# Patient Record
Sex: Female | Born: 1950 | Race: Black or African American | Hispanic: No | Smoking: Current every day smoker
Health system: Southern US, Community
[De-identification: ages and names within clinical notes are randomized; demographics above are authoritative.]

## PROBLEM LIST (undated history)

## (undated) DIAGNOSIS — M1990 Unspecified osteoarthritis, unspecified site: Secondary | ICD-10-CM

## (undated) DIAGNOSIS — H409 Unspecified glaucoma: Secondary | ICD-10-CM

## (undated) DIAGNOSIS — E079 Disorder of thyroid, unspecified: Secondary | ICD-10-CM

## (undated) DIAGNOSIS — F419 Anxiety disorder, unspecified: Secondary | ICD-10-CM

## (undated) DIAGNOSIS — E119 Type 2 diabetes mellitus without complications: Secondary | ICD-10-CM

## (undated) DIAGNOSIS — R413 Other amnesia: Secondary | ICD-10-CM

## (undated) DIAGNOSIS — R011 Cardiac murmur, unspecified: Secondary | ICD-10-CM

## (undated) DIAGNOSIS — E785 Hyperlipidemia, unspecified: Secondary | ICD-10-CM

## (undated) DIAGNOSIS — I10 Essential (primary) hypertension: Secondary | ICD-10-CM

## (undated) DIAGNOSIS — C801 Malignant (primary) neoplasm, unspecified: Secondary | ICD-10-CM

## (undated) HISTORY — DX: Anxiety disorder, unspecified: F41.9

## (undated) HISTORY — DX: Type 2 diabetes mellitus without complications: E11.9

## (undated) HISTORY — DX: Cardiac murmur, unspecified: R01.1

## (undated) HISTORY — DX: Other amnesia: R41.3

## (undated) HISTORY — PX: OTHER SURGICAL HISTORY: SHX169

## (undated) HISTORY — DX: Unspecified glaucoma: H40.9

## (undated) HISTORY — DX: Hyperlipidemia, unspecified: E78.5

## (undated) HISTORY — PX: COLONOSCOPY: SHX174

## (undated) HISTORY — DX: Malignant (primary) neoplasm, unspecified: C80.1

## (undated) HISTORY — DX: Unspecified osteoarthritis, unspecified site: M19.90

## (undated) HISTORY — DX: Disorder of thyroid, unspecified: E07.9

---

## 1998-08-11 ENCOUNTER — Other Ambulatory Visit: Admission: RE | Admit: 1998-08-11 | Discharge: 1998-08-11 | Payer: Self-pay | Admitting: Nurse Practitioner

## 1998-09-27 ENCOUNTER — Encounter: Payer: Self-pay | Admitting: Emergency Medicine

## 1998-09-27 ENCOUNTER — Emergency Department (HOSPITAL_COMMUNITY): Admission: EM | Admit: 1998-09-27 | Discharge: 1998-09-27 | Payer: Self-pay | Admitting: Emergency Medicine

## 1998-09-28 ENCOUNTER — Encounter: Payer: Self-pay | Admitting: Orthopedic Surgery

## 1998-09-29 ENCOUNTER — Inpatient Hospital Stay (HOSPITAL_COMMUNITY): Admission: RE | Admit: 1998-09-29 | Discharge: 1998-09-30 | Payer: Self-pay | Admitting: Orthopedic Surgery

## 1999-05-05 ENCOUNTER — Ambulatory Visit (HOSPITAL_BASED_OUTPATIENT_CLINIC_OR_DEPARTMENT_OTHER): Admission: RE | Admit: 1999-05-05 | Discharge: 1999-05-05 | Payer: Self-pay | Admitting: Orthopedic Surgery

## 1999-08-01 ENCOUNTER — Ambulatory Visit (HOSPITAL_COMMUNITY): Admission: RE | Admit: 1999-08-01 | Discharge: 1999-08-01 | Payer: Self-pay | Admitting: *Deleted

## 2000-06-07 ENCOUNTER — Ambulatory Visit (HOSPITAL_COMMUNITY): Admission: RE | Admit: 2000-06-07 | Discharge: 2000-06-07 | Payer: Self-pay | Admitting: *Deleted

## 2002-02-23 ENCOUNTER — Emergency Department (HOSPITAL_COMMUNITY): Admission: EM | Admit: 2002-02-23 | Discharge: 2002-02-23 | Payer: Self-pay | Admitting: Emergency Medicine

## 2002-03-25 ENCOUNTER — Encounter: Payer: Self-pay | Admitting: Internal Medicine

## 2002-03-25 ENCOUNTER — Ambulatory Visit (HOSPITAL_COMMUNITY): Admission: RE | Admit: 2002-03-25 | Discharge: 2002-03-25 | Payer: Self-pay | Admitting: Internal Medicine

## 2004-02-10 ENCOUNTER — Emergency Department (HOSPITAL_COMMUNITY): Admission: EM | Admit: 2004-02-10 | Discharge: 2004-02-10 | Payer: Self-pay | Admitting: *Deleted

## 2004-03-16 ENCOUNTER — Ambulatory Visit: Payer: Self-pay | Admitting: Family Medicine

## 2004-05-22 ENCOUNTER — Emergency Department (HOSPITAL_COMMUNITY): Admission: EM | Admit: 2004-05-22 | Discharge: 2004-05-22 | Payer: Self-pay | Admitting: Emergency Medicine

## 2005-07-03 ENCOUNTER — Encounter (INDEPENDENT_AMBULATORY_CARE_PROVIDER_SITE_OTHER): Payer: Self-pay | Admitting: *Deleted

## 2005-07-03 LAB — CONVERTED CEMR LAB

## 2005-07-13 ENCOUNTER — Ambulatory Visit: Payer: Self-pay | Admitting: Family Medicine

## 2005-07-21 ENCOUNTER — Ambulatory Visit: Payer: Self-pay | Admitting: Gastroenterology

## 2005-07-27 ENCOUNTER — Ambulatory Visit: Payer: Self-pay | Admitting: Family Medicine

## 2005-07-28 ENCOUNTER — Ambulatory Visit (HOSPITAL_COMMUNITY): Admission: RE | Admit: 2005-07-28 | Discharge: 2005-07-28 | Payer: Self-pay | Admitting: Family Medicine

## 2005-08-04 ENCOUNTER — Ambulatory Visit: Payer: Self-pay | Admitting: Family Medicine

## 2005-08-22 ENCOUNTER — Ambulatory Visit: Payer: Self-pay | Admitting: Gastroenterology

## 2005-09-12 ENCOUNTER — Ambulatory Visit: Payer: Self-pay | Admitting: Family Medicine

## 2005-10-03 ENCOUNTER — Ambulatory Visit: Payer: Self-pay | Admitting: Family Medicine

## 2005-10-13 ENCOUNTER — Ambulatory Visit: Payer: Self-pay | Admitting: Family Medicine

## 2005-12-29 ENCOUNTER — Ambulatory Visit: Payer: Self-pay | Admitting: Sports Medicine

## 2006-01-01 ENCOUNTER — Ambulatory Visit: Payer: Self-pay | Admitting: Sports Medicine

## 2006-01-10 ENCOUNTER — Ambulatory Visit (HOSPITAL_COMMUNITY): Admission: RE | Admit: 2006-01-10 | Discharge: 2006-01-10 | Payer: Self-pay | Admitting: Sports Medicine

## 2006-01-10 ENCOUNTER — Ambulatory Visit: Payer: Self-pay | Admitting: Cardiovascular Disease

## 2006-01-10 ENCOUNTER — Encounter: Payer: Self-pay | Admitting: Cardiovascular Disease

## 2006-01-26 ENCOUNTER — Ambulatory Visit: Payer: Self-pay | Admitting: Family Medicine

## 2006-02-12 ENCOUNTER — Ambulatory Visit: Payer: Self-pay | Admitting: Sports Medicine

## 2006-02-26 ENCOUNTER — Encounter: Admission: RE | Admit: 2006-02-26 | Discharge: 2006-05-08 | Payer: Self-pay | Admitting: Family Medicine

## 2006-04-03 ENCOUNTER — Ambulatory Visit: Payer: Self-pay | Admitting: Family Medicine

## 2006-07-25 ENCOUNTER — Ambulatory Visit: Payer: Self-pay | Admitting: Family Medicine

## 2006-08-16 ENCOUNTER — Ambulatory Visit: Payer: Self-pay | Admitting: Sports Medicine

## 2006-08-30 DIAGNOSIS — I10 Essential (primary) hypertension: Secondary | ICD-10-CM

## 2006-08-30 DIAGNOSIS — M1990 Unspecified osteoarthritis, unspecified site: Secondary | ICD-10-CM

## 2006-08-30 DIAGNOSIS — E0500 Thyrotoxicosis with diffuse goiter without thyrotoxic crisis or storm: Secondary | ICD-10-CM

## 2006-08-30 DIAGNOSIS — K219 Gastro-esophageal reflux disease without esophagitis: Secondary | ICD-10-CM

## 2006-08-31 ENCOUNTER — Encounter (INDEPENDENT_AMBULATORY_CARE_PROVIDER_SITE_OTHER): Payer: Self-pay | Admitting: *Deleted

## 2006-10-30 ENCOUNTER — Telehealth (INDEPENDENT_AMBULATORY_CARE_PROVIDER_SITE_OTHER): Payer: Self-pay | Admitting: *Deleted

## 2006-11-07 ENCOUNTER — Emergency Department (HOSPITAL_COMMUNITY): Admission: EM | Admit: 2006-11-07 | Discharge: 2006-11-07 | Payer: Self-pay | Admitting: Emergency Medicine

## 2006-12-12 ENCOUNTER — Telehealth: Payer: Self-pay | Admitting: *Deleted

## 2006-12-14 ENCOUNTER — Encounter: Payer: Self-pay | Admitting: Family Medicine

## 2006-12-14 ENCOUNTER — Ambulatory Visit: Payer: Self-pay | Admitting: Family Medicine

## 2006-12-14 LAB — CONVERTED CEMR LAB
BUN: 12 mg/dL (ref 6–23)
Calcium: 10.3 mg/dL (ref 8.4–10.5)
Chloride: 103 meq/L (ref 96–112)
Sodium: 140 meq/L (ref 135–145)
TSH: 1.197 microintl units/mL (ref 0.350–5.50)

## 2007-02-05 ENCOUNTER — Ambulatory Visit: Payer: Self-pay | Admitting: Family Medicine

## 2007-02-05 LAB — CONVERTED CEMR LAB: Blood Glucose, Fingerstick: 132

## 2007-02-06 ENCOUNTER — Encounter: Payer: Self-pay | Admitting: Family Medicine

## 2008-04-21 ENCOUNTER — Ambulatory Visit: Payer: Self-pay | Admitting: Family Medicine

## 2008-04-21 ENCOUNTER — Encounter: Payer: Self-pay | Admitting: Family Medicine

## 2008-04-21 ENCOUNTER — Observation Stay (HOSPITAL_COMMUNITY): Admission: EM | Admit: 2008-04-21 | Discharge: 2008-04-23 | Payer: Self-pay | Admitting: Emergency Medicine

## 2008-05-01 ENCOUNTER — Ambulatory Visit: Payer: Self-pay | Admitting: Family Medicine

## 2008-06-17 ENCOUNTER — Encounter: Payer: Self-pay | Admitting: Family Medicine

## 2008-06-18 ENCOUNTER — Encounter: Payer: Self-pay | Admitting: Family Medicine

## 2009-03-18 ENCOUNTER — Telehealth: Payer: Self-pay | Admitting: Family Medicine

## 2009-03-19 ENCOUNTER — Emergency Department (HOSPITAL_COMMUNITY): Admission: EM | Admit: 2009-03-19 | Discharge: 2009-03-19 | Payer: Self-pay | Admitting: Emergency Medicine

## 2009-03-20 ENCOUNTER — Encounter (INDEPENDENT_AMBULATORY_CARE_PROVIDER_SITE_OTHER): Payer: Self-pay | Admitting: *Deleted

## 2009-03-20 DIAGNOSIS — F17200 Nicotine dependence, unspecified, uncomplicated: Secondary | ICD-10-CM

## 2009-03-23 ENCOUNTER — Ambulatory Visit: Payer: Self-pay | Admitting: Family Medicine

## 2009-03-24 LAB — CONVERTED CEMR LAB
AST: 17 units/L (ref 0–37)
Albumin: 5.2 g/dL (ref 3.5–5.2)
BUN: 32 mg/dL — ABNORMAL HIGH (ref 6–23)
CO2: 22 meq/L (ref 19–32)
Cholesterol: 210 mg/dL — ABNORMAL HIGH (ref 0–200)
Glucose, Bld: 93 mg/dL (ref 70–99)
HCT: 40.9 % (ref 36.0–46.0)
LDL Cholesterol: 118 mg/dL — ABNORMAL HIGH (ref 0–99)
MCHC: 33.3 g/dL (ref 30.0–36.0)
Platelets: 277 10*3/uL (ref 150–400)
RBC: 4.31 M/uL (ref 3.87–5.11)
Total CHOL/HDL Ratio: 2.7
Triglycerides: 74 mg/dL (ref <150)
VLDL: 15 mg/dL (ref 0–40)
WBC: 5.4 10*3/uL (ref 4.0–10.5)

## 2009-05-20 ENCOUNTER — Telehealth: Payer: Self-pay | Admitting: Family Medicine

## 2009-05-26 ENCOUNTER — Telehealth: Payer: Self-pay | Admitting: *Deleted

## 2009-06-18 ENCOUNTER — Ambulatory Visit: Payer: Self-pay | Admitting: Family Medicine

## 2009-06-23 ENCOUNTER — Telehealth: Payer: Self-pay | Admitting: Family Medicine

## 2009-06-23 ENCOUNTER — Encounter: Payer: Self-pay | Admitting: Family Medicine

## 2009-08-04 ENCOUNTER — Ambulatory Visit: Payer: Self-pay | Admitting: Family Medicine

## 2009-08-04 ENCOUNTER — Encounter: Payer: Self-pay | Admitting: Family Medicine

## 2009-08-11 ENCOUNTER — Ambulatory Visit (HOSPITAL_COMMUNITY): Admission: RE | Admit: 2009-08-11 | Discharge: 2009-08-11 | Payer: Self-pay | Admitting: Family Medicine

## 2009-08-27 ENCOUNTER — Ambulatory Visit: Payer: Self-pay | Admitting: Family Medicine

## 2009-08-31 ENCOUNTER — Encounter: Payer: Self-pay | Admitting: Family Medicine

## 2009-09-22 ENCOUNTER — Ambulatory Visit: Payer: Self-pay | Admitting: Family Medicine

## 2009-12-13 ENCOUNTER — Encounter: Payer: Self-pay | Admitting: Family Medicine

## 2009-12-13 ENCOUNTER — Ambulatory Visit: Payer: Self-pay | Admitting: Family Medicine

## 2009-12-13 LAB — CONVERTED CEMR LAB
BUN: 23 mg/dL (ref 6–23)
Calcium: 10.9 mg/dL — ABNORMAL HIGH (ref 8.4–10.5)
Glucose, Bld: 82 mg/dL (ref 70–99)
Potassium: 3.6 meq/L (ref 3.5–5.3)

## 2010-02-18 ENCOUNTER — Encounter: Payer: Self-pay | Admitting: Family Medicine

## 2010-02-18 ENCOUNTER — Ambulatory Visit: Payer: Self-pay | Admitting: Family Medicine

## 2010-02-18 LAB — CONVERTED CEMR LAB
BUN: 40 mg/dL — ABNORMAL HIGH (ref 6–23)
Bilirubin Urine: NEGATIVE
Calcium: 9.5 mg/dL (ref 8.4–10.5)
Chloride: 91 meq/L — ABNORMAL LOW (ref 96–112)
Creatinine, Ser: 1.59 mg/dL — ABNORMAL HIGH (ref 0.40–1.20)
Glucose, Urine, Semiquant: NEGATIVE
Hemoglobin: 11.5 g/dL — ABNORMAL LOW (ref 12.0–15.0)
Ketones, urine, test strip: NEGATIVE
MCHC: 34.4 g/dL (ref 30.0–36.0)
Nitrite: NEGATIVE
Potassium: 2.9 meq/L — ABNORMAL LOW (ref 3.5–5.3)
Sodium: 131 meq/L — ABNORMAL LOW (ref 135–145)

## 2010-02-21 ENCOUNTER — Encounter: Payer: Self-pay | Admitting: Family Medicine

## 2010-02-21 ENCOUNTER — Ambulatory Visit: Payer: Self-pay | Admitting: Family Medicine

## 2010-02-21 LAB — CONVERTED CEMR LAB
Glucose, Urine, Semiquant: NEGATIVE
Nitrite: NEGATIVE

## 2010-02-22 ENCOUNTER — Telehealth: Payer: Self-pay | Admitting: Family Medicine

## 2010-02-22 ENCOUNTER — Encounter: Payer: Self-pay | Admitting: Family Medicine

## 2010-02-22 LAB — CONVERTED CEMR LAB
Amylase: 67 units/L (ref 0–105)
Calcium: 9.4 mg/dL (ref 8.4–10.5)
Chloride: 94 meq/L — ABNORMAL LOW (ref 96–112)
Glucose, Bld: 126 mg/dL — ABNORMAL HIGH (ref 70–99)
Potassium: 2.7 meq/L — CL (ref 3.5–5.3)
Sodium: 133 meq/L — ABNORMAL LOW (ref 135–145)
Total Bilirubin: 0.5 mg/dL (ref 0.3–1.2)

## 2010-02-23 ENCOUNTER — Ambulatory Visit: Payer: Self-pay | Admitting: Family Medicine

## 2010-02-23 ENCOUNTER — Encounter: Payer: Self-pay | Admitting: Family Medicine

## 2010-02-24 LAB — CONVERTED CEMR LAB
BUN: 16 mg/dL (ref 6–23)
CO2: 26 meq/L (ref 19–32)
Chloride: 100 meq/L (ref 96–112)
Creatinine, Ser: 0.76 mg/dL (ref 0.40–1.20)
Glucose, Bld: 94 mg/dL (ref 70–99)
Potassium: 3.7 meq/L (ref 3.5–5.3)

## 2010-03-04 ENCOUNTER — Ambulatory Visit: Payer: Self-pay | Admitting: Family Medicine

## 2010-03-04 ENCOUNTER — Encounter: Payer: Self-pay | Admitting: Family Medicine

## 2010-03-04 ENCOUNTER — Ambulatory Visit (HOSPITAL_COMMUNITY): Admission: RE | Admit: 2010-03-04 | Discharge: 2010-03-04 | Payer: Self-pay | Admitting: Family Medicine

## 2010-03-08 ENCOUNTER — Ambulatory Visit: Payer: Self-pay | Admitting: Cardiology

## 2010-03-08 DIAGNOSIS — R011 Cardiac murmur, unspecified: Secondary | ICD-10-CM | POA: Insufficient documentation

## 2010-03-14 ENCOUNTER — Ambulatory Visit (HOSPITAL_COMMUNITY): Admission: RE | Admit: 2010-03-14 | Discharge: 2010-03-14 | Payer: Self-pay | Admitting: Cardiology

## 2010-03-14 ENCOUNTER — Ambulatory Visit: Payer: Self-pay

## 2010-03-14 ENCOUNTER — Ambulatory Visit: Payer: Self-pay | Admitting: Cardiovascular Disease

## 2010-03-14 ENCOUNTER — Ambulatory Visit: Payer: Self-pay | Admitting: Family Medicine

## 2010-03-14 ENCOUNTER — Encounter: Payer: Self-pay | Admitting: Cardiology

## 2010-04-13 ENCOUNTER — Ambulatory Visit: Payer: Self-pay | Admitting: Family Medicine

## 2010-04-13 ENCOUNTER — Telehealth: Payer: Self-pay | Admitting: *Deleted

## 2010-04-13 DIAGNOSIS — R319 Hematuria, unspecified: Secondary | ICD-10-CM | POA: Insufficient documentation

## 2010-04-13 LAB — CONVERTED CEMR LAB
Glucose, Urine, Semiquant: NEGATIVE
Specific Gravity, Urine: 1.015
Urobilinogen, UA: 0.2
pH: 7

## 2010-04-15 ENCOUNTER — Ambulatory Visit (HOSPITAL_COMMUNITY): Admission: RE | Admit: 2010-04-15 | Discharge: 2010-04-15 | Payer: Self-pay | Admitting: Family Medicine

## 2010-04-20 ENCOUNTER — Telehealth: Payer: Self-pay | Admitting: Family Medicine

## 2010-04-26 ENCOUNTER — Ambulatory Visit: Payer: Self-pay | Admitting: Family Medicine

## 2010-04-27 ENCOUNTER — Encounter: Payer: Self-pay | Admitting: Family Medicine

## 2010-05-02 LAB — CONVERTED CEMR LAB

## 2010-05-11 ENCOUNTER — Encounter: Payer: Self-pay | Admitting: Family Medicine

## 2010-06-03 ENCOUNTER — Encounter: Payer: Self-pay | Admitting: Family Medicine

## 2010-06-03 ENCOUNTER — Ambulatory Visit: Payer: Self-pay | Admitting: Family Medicine

## 2010-06-06 LAB — CONVERTED CEMR LAB
BUN: 30 mg/dL — ABNORMAL HIGH (ref 6–23)
Chloride: 100 meq/L (ref 96–112)
Ferritin: 218 ng/mL (ref 10–291)
Glucose, Bld: 141 mg/dL — ABNORMAL HIGH (ref 70–99)
HCT: 38.3 % (ref 36.0–46.0)
Iron: 79 ug/dL (ref 42–145)
MCHC: 33.9 g/dL (ref 30.0–36.0)
TIBC: 401 ug/dL (ref 250–470)

## 2010-06-08 ENCOUNTER — Telehealth: Payer: Self-pay | Admitting: Family Medicine

## 2010-06-10 ENCOUNTER — Ambulatory Visit: Payer: Self-pay | Admitting: Family Medicine

## 2010-06-10 ENCOUNTER — Encounter: Payer: Self-pay | Admitting: Family Medicine

## 2010-06-14 LAB — CONVERTED CEMR LAB
Calcium, Total (PTH): 10.7 mg/dL — ABNORMAL HIGH (ref 8.4–10.5)
Chloride: 103 meq/L (ref 96–112)
Glucose, Bld: 118 mg/dL — ABNORMAL HIGH (ref 70–99)
PTH: 24.1 pg/mL (ref 14.0–72.0)
Potassium: 3.7 meq/L (ref 3.5–5.3)
Sodium: 139 meq/L (ref 135–145)
Vit D, 25-Hydroxy: 42 ng/mL (ref 30–89)

## 2010-06-23 ENCOUNTER — Encounter: Payer: Self-pay | Admitting: Family Medicine

## 2010-06-23 ENCOUNTER — Ambulatory Visit: Payer: Self-pay | Admitting: Family Medicine

## 2010-06-23 DIAGNOSIS — N179 Acute kidney failure, unspecified: Secondary | ICD-10-CM

## 2010-06-23 LAB — CONVERTED CEMR LAB
BUN: 13 mg/dL (ref 6–23)
CO2: 26 meq/L (ref 19–32)
Chloride: 102 meq/L (ref 96–112)
Glucose, Bld: 98 mg/dL (ref 70–99)
Sodium: 139 meq/L (ref 135–145)

## 2010-07-07 ENCOUNTER — Ambulatory Visit: Admit: 2010-07-07 | Payer: Self-pay

## 2010-07-12 ENCOUNTER — Ambulatory Visit: Admission: RE | Admit: 2010-07-12 | Discharge: 2010-07-12 | Payer: Self-pay | Source: Home / Self Care

## 2010-07-12 ENCOUNTER — Encounter: Payer: Self-pay | Admitting: Family Medicine

## 2010-07-12 LAB — CONVERTED CEMR LAB
BUN: 22 mg/dL (ref 6–23)
Hemoglobin: 12.9 g/dL (ref 12.0–15.0)
LDL Cholesterol: 89 mg/dL (ref 0–99)
MCHC: 33.6 g/dL (ref 30.0–36.0)
MCV: 90.8 fL (ref 78.0–100.0)
Platelets: 301 10*3/uL (ref 150–400)
RBC: 4.23 M/uL (ref 3.87–5.11)
Sodium: 139 meq/L (ref 135–145)
Triglycerides: 55 mg/dL (ref <150)
WBC: 5.5 10*3/uL (ref 4.0–10.5)

## 2010-07-18 ENCOUNTER — Telehealth: Payer: Self-pay | Admitting: Family Medicine

## 2010-07-31 LAB — CONVERTED CEMR LAB
BUN: 10 mg/dL (ref 6–23)
Chloride: 103 meq/L (ref 96–112)
Cortisol, Plasma: 8 ug/dL
Glucose, Bld: 85 mg/dL (ref 70–99)
Glucose, Urine, Semiquant: NEGATIVE
HCT: 37 % (ref 36.0–46.0)
Hemoglobin: 11.9 g/dL — ABNORMAL LOW (ref 12.0–15.0)
Ketones, urine, test strip: NEGATIVE
RBC: 3.93 M/uL (ref 3.87–5.11)
Urobilinogen, UA: 0.2

## 2010-08-02 NOTE — Assessment & Plan Note (Signed)
Summary: f/up,tcb   Vital Signs:  Patient profile:   60 year old female Height:      62 inches Weight:      115 pounds BMI:     21.11 Temp:     98.3 degrees F oral Pulse rate:   60 / minute BP sitting:   138 / 52  (right arm) Cuff size:   regular  Vitals Entered By: Tessie Fass CMA (September 22, 2009 4:36 PM)  Serial Vital Signs/Assessments:  Time      Position  BP       Pulse  Resp  Temp     By                     135/54                         Laneya Gasaway MD  CC: f/u bp Is Patient Diabetic? No Pain Assessment Patient in pain? no        Primary Care Provider:  Elisa Kutner MD  CC:  f/u bp.  History of Present Illness: 60 y/o F here for BP f/u  HYPERTENSION Disease Monitoring Blood pressure range: 140s-150s Medications: hctz 25, lisinopril 40, norvasc 10 Compliance: yes  Lightheadedness:no  Edema: no  Chest pain: no  Dyspnea: no Prevention Exercise: dancing, walking, stationary bike  Salt restriction:still using a LITTLE  Tobacco abuse:  Smoking 4 cig per day. Husband smokes.  still trying to quit  Habits & Providers  Alcohol-Tobacco-Diet     Tobacco Status: current     Cigarette Packs/Day: 0.5  Exercise-Depression-Behavior     Does Patient Exercise: yes     Exercise Counseling: not indicated; exercise is adequate     Type of exercise: walking, stationary bike, dancing     Exercise (avg: min/session): <30     Times/week: 5  Current Medications (verified): 1)  Lisinopril-Hydrochlorothiazide 20-12.5 Mg Tabs (Lisinopril-Hydrochlorothiazide) .... Take 2 Tablet By Mouth Once A Day 2)  Norvasc 10 Mg Tabs (Amlodipine Besylate) .Marland Kitchen.. 1 Tab By Mouth Daily For Blood Pressure 3)  Simvastatin 40 Mg Tabs (Simvastatin) .Marland Kitchen.. 1 Tab By Mouth At Bedtime 4)  Aspirin 81 Mg Tabs (Aspirin) .... By Mouth Daily 5)  Permethrin 5 % Crea (Permethrin) .... Apply To Entire Body and Leave On For 12 Hours Then Rinse. Disp 2 Applications. 6)  Hydroxyzine Hcl 50 Mg Tabs (Hydroxyzine Hcl) .... 1/2  To 56full Tablet Every 12 Hours As Needed For Itching.  Allergies (verified): 1)  ! 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 Abnormal Stress EKG 12/2005 HTN Graves dz s/p ablation Eczema GERD   Past Surgical History: Last updated: 07/04/09 -cyst behind left ear removed by general surgery 1992 -Left rotator cuff repair 2003 -Left foot surgery after falling 2000  Family History: Last updated: 07/04/09 father - deceased at 2 from CVA mother - deceased (scleroderma, DM, RA, HTN) sister -deceased w/ DM, polysubstance abuse No Breast Cancer No GYN cancer No colon cancer  Social History: Last updated: 07-04-09 [employer]; Smokes 3-4 cigs/day (67yrs), occ EtOH, lives with husband Has 3 children, 17 grand children  Code Status:  Full Code  Risk Factors: Alcohol Use: <1 (07/04/09) Exercise: yes (09/22/2009)  Risk Factors: Smoking Status: current (09/22/2009) Packs/Day: 0.5 (09/22/2009)  Review of Systems       per hpi  Physical Exam  General:  Well-developed,well-nourished,in no acute distress; alert,appropriate and cooperative throughout examination. vitals reviewed Neck:  supple, full ROM, and no masses.   Lungs:  Normal respiratory effort, chest expands symmetrically. Lungs are clear to auscultation, no crackles or wheezes. Heart:  normal rate and regular rhythm.  normal S1, S2 Abdomen:  Bowel sounds positive,abdomen soft and non-tender without masses, organomegaly or hernias noted. Pulses:  R and L carotid,radial,dorsalis pedis  are full and equal bilaterally Extremities:  No clubbing, cyanosis, edema, or deformity noted with normal full range of motion of all joints.   Neurologic:  No cranial nerve deficits noted. Station and gait are normal. Plantar reflexes are down-going bilaterally. DTRs are symmetrical throughout.  Sensory, motor and coordinative functions appear intact. Psych:  Oriented X3.     Impression & Recommendations:  Problem # 1:  HYPERTENSION, BENIGN SYSTEMIC (ICD-401.1) Assessment Improved  Will continue current meds for now.  Diastolic BP mildly low in the 96E.  Will monitor.  Pt to rtc in 2 months.  Her updated medication list for this problem includes:    Lisinopril-hydrochlorothiazide 20-12.5 Mg Tabs (Lisinopril-hydrochlorothiazide) .Marland Kitchen... Take 2 tablet by mouth once a day    Norvasc 10 Mg Tabs (Amlodipine besylate) .Marland Kitchen... 1 tab by mouth daily for blood pressure  Orders: [facility]- Est Level  3 (95284)  Complete Medication List: 1)  Lisinopril-hydrochlorothiazide 20-12.5 Mg Tabs (Lisinopril-hydrochlorothiazide) .... Take 2 tablet by mouth once a day 2)  Norvasc 10 Mg Tabs (Amlodipine besylate) .Marland Kitchen.. 1 tab by mouth daily for blood pressure 3)  Simvastatin 40 Mg Tabs (Simvastatin) .Marland Kitchen.. 1 tab by mouth at bedtime 4)  Aspirin 81 Mg Tabs (Aspirin) .... By mouth daily 5)  Permethrin 5 % Crea (Permethrin) .... Apply to entire body and leave on for 12 hours then rinse. disp 2 applications. 6)  Hydroxyzine Hcl 50 Mg Tabs (Hydroxyzine hcl) .... 1/2 to 58full tablet every 12 hours as needed for itching.  Patient Instructions: 1)  Please schedule a follow-up appointment in 2 months for blood pressure.  2)  Tobacco is very bad for your health and your loved ones ! You should stop smoking !  3)  Stop smoking tips: Choose a quit date. Cut down before the quit date. Decide what you will do as a substitute when you feel the urge to smoke(gum, toothpick, exercise).  4)  It is important that you exercise reguarly at least 30 minutes 5 times a week. If you develop chest pain, have severe difficulty breathing, or feel very tired, stop exercising immediately and seek medical attention.  Prescriptions: NORVASC 10 MG TABS (AMLODIPINE BESYLATE) 1 tab by mouth daily for blood pressure  #34 x 6   Entered and  Authorized by:   Angeline Slim MD   Signed by:   Angeline Slim MD on 09/22/2009   Method used:   Electronically to        [facility] [phone]* (retail)       [address]       [place], [place]  [address]       Ph: [phone]       Fax: [phone]   RxID:   [id number] LISINOPRIL-HYDROCHLOROTHIAZIDE 20-12.5 MG TABS (LISINOPRIL-HYDROCHLOROTHIAZIDE) Take 2 tablet by mouth once a day  #66 x 6   Entered and Authorized by:   Angeline Slim MD   Signed by:   Angeline Slim MD on 09/22/2009   Method used:   Electronically to  [facility] [address] [phone]* (retail)       [address]       [place], [place]  [address]       Ph: [phone]       Fax: [phone]   RxID:   [id number]     Prevention & Chronic Care Immunizations   Influenza vaccine: Not documented   Influenza vaccine deferral: Refused  (06/18/2009)    Tetanus booster: 07/03/2005: Done.    Pneumococcal vaccine: Not documented  Colorectal Screening   Hemoccult: Not documented   Hemoccult action/deferral: Ordered  (06/18/2009)    Colonoscopy: Not documented   Colonoscopy action/deferral: GI Referral  (08/04/2009)  Other Screening   Pap smear: NEGATIVE FOR INTRAEPITHELIAL LESIONS OR MALIGNANCY.  (06/18/2009)   Pap smear action/deferral: Deferred-3 yr interval  (06/23/2009)   Pap smear due: 06/23/2012    Mammogram: ASSESSMENT: Negative - BI-RADS 1^MM DIGITAL SCREENING  (08/11/2009)   Mammogram action/deferral: Ordered  (08/04/2009)   Mammogram due: 08/11/2010   Smoking status: current  (09/22/2009)   Smoking cessation counseling: YES  (09/22/2009)  Lipids   Total Cholesterol: 210  (03/23/2009)   LDL: 118  (03/23/2009)   LDL Direct: Not documented   HDL: 77  (03/23/2009)   Triglycerides: 74  (03/23/2009)  Hypertension   Last Blood Pressure: 138 / 52  (09/22/2009)   Serum creatinine: 1.31  (03/23/2009)   BMP action: Ordered   Serum potassium 4.5  (03/23/2009)    Hypertension flowsheet reviewed?: Yes    Progress toward BP goal: At goal  Self-Management Support :   Personal Goals (by the next clinic visit) :      Personal blood pressure goal: 140/90  (08/04/2009)   Hypertension self-management support: Written self-care plan  (09/22/2009)   Hypertension self-care plan printed.

## 2010-08-02 NOTE — Assessment & Plan Note (Signed)
Summary: F/U PER DR. TA/[facility]   Vital Signs:  Patient profile:   60 year old female Height:      62 inches Weight:      112 pounds BMI:     20.56 Temp:     98.1 degrees F oral Pulse rate:   57 / minute BP sitting:   114 / 56  (right arm) Cuff size:   regular  Vitals Entered By: Tessie Fass CMA (February 21, 2010 1:39 PM) CC: F/U dizziness Is Patient Diabetic? No Pain Assessment Patient in pain? yes     Location: neck and shoulders Intensity: 9   Primary Care Provider:  Cat Ta MD  CC:  F/U dizziness.  History of Present Illness: Three day follow up for vomiting, low BP, dizziness....labs reveled acute bump in serum creatitine, hypokalemia.  Urinalysis with RBC, protein, few WBCs.    She is here with significant other, denied excessive alcohol intake.  When out of the room her female friend asked if it could have been caused by salt, alcohol and smoking.  She denies binge drinking, friend reported a glass of liquor before she got sick.  She has not vomited for several days, is drinking fluids, eating fruit.  Had an egg and baked potato for breakfast and held it down.  Denies indigestion, fever, dysuria.  Allergies: 1)  ! Codeine  Review of Systems       The patient complains of anorexia, prolonged cough, and abdominal pain.  The patient denies fever, chest pain, syncope, hemoptysis, and severe indigestion/heartburn.    Physical Exam  General:  Thin, in no acute distress Mouth:  moist membranes Lungs:  normal respiratory effort and normal breath sounds.   Heart:  normal rate, regular rhythm, and no murmur.   Abdomen:  RUQ tenderness, + Murphys, otherwise soft, and hyperactive bowel sounds   Impression & Recommendations:  Problem # 1:  VOMITING (ICD-787.03) Resolved, labs indicating loss of electrolytes and acute dehydration.  She is to hold her by mouth meds.  Consider acute pancreatitis as there seems to have been an alcohol binge that may have preceeded her illness  per her female friend.  She was guarded when asked these questions. Orders: Amylase-[facility] ([id number]) Lipase-[facility] ([id number]) Urinalysis-[facility] (00000) [facility]- Est Level  3 (29562)  Problem # 2:  HEMATURIA UNSPECIFIED (ICD-599.70) recheck urine, did not culture as contaminated sample and patient left Orders: Urinalysis-[facility] (00000) [facility]- Est Level  3 (13086)  Problem # 3:  HYPOKALEMIA (ICD-276.8) repeat labs, to drink fruit juice and eat fruit Orders: Comp Met-[facility] ([id number]) [facility]- Est Level  3 (28413)  Problem # 4:  HYPERTENSION, BENIGN SYSTEMIC (ICD-401.1) HOLD meds, BP 114/56 and creatitine 1.51 Her updated medication list for this problem includes:    Lisinopril-hydrochlorothiazide 20-12.5 Mg Tabs (Lisinopril-hydrochlorothiazide) .Marland Kitchen... Take 2 tablet by mouth once a day hold    Norvasc 10 Mg Tabs (Amlodipine besylate) .Marland Kitchen... 1 tab by mouth daily for blood pressure hold  Complete Medication List: 1)  Lisinopril-hydrochlorothiazide 20-12.5 Mg Tabs (Lisinopril-hydrochlorothiazide) .... Take 2 tablet by mouth once a day hold 2)  Norvasc 10 Mg Tabs (Amlodipine besylate) .Marland Kitchen.. 1 tab by mouth daily for blood pressure hold 3)  Simvastatin 40 Mg Tabs (Simvastatin) .Marland Kitchen.. 1 tab by mouth at bedtime hold 4)  Aspirin 81 Mg Tabs (Aspirin) .... By mouth daily hold 5)  Hydrocortisone 2.5 % Crea (Hydrocortisone) .... Apply to legs two times a day.  dispense 30 gram.  Patient Instructions: 1)  No alcohol at  all 2)  Drink a lot of water, eat lightly and healthy 3)  Do not take any of your meds until Dr. Janalyn Harder sees you this week. 4)  Follow up visit with Dr. Janalyn Harder this week   Laboratory Results   Urine Tests  Date/Time Received: February 21, 2010 2:16 PM  Date/Time Reported: February 21, 2010 2:39 PM   Routine Urinalysis   Color: yellow Appearance: Clear Glucose: negative   (Normal Range: Negative) Bilirubin: negative   (Normal Range: Negative) Ketone: negative   (Normal Range: Negative) Spec. Gravity:  1.010   (Normal Range: 1.003-1.035) Blood: moderate   (Normal Range: Negative) pH: 5.5   (Normal Range: 5.0-8.0) Protein: negative   (Normal Range: Negative) Urobilinogen: 0.2   (Normal Range: 0-1) Nitrite: negative   (Normal Range: Negative) Leukocyte Esterace: negative   (Normal Range: Negative)  Urine Microscopic WBC/HPF: 5-10 RBC/HPF: 1-5 Bacteria/HPF: 2+ Epithelial/HPF: 10- 20 Casts/LPF: 1-5 RTE cell casts Other: several RTE cells    Comments: ...............test performed by......Marland KitchenBonnie A. [place]az[place], MLS (ASCP)cm

## 2010-08-02 NOTE — Letter (Signed)
Summary: *Referral Letter for colonoscopy  [facility]  [address]   [place], [place] [address]   Phone: [phone]  Fax: [phone]    08/04/2009  Thank you in advance for agreeing to see my patient:  Jonae Renshaw [address] [place], [place]  [address]  Phone: [phone]  Reason for Referral:  Ms. Tasia Catchings is a 60 y/o Female with Pmhx of HTN and HLD.  She does not have a family history of colon cancer.  She does not endorse constipation and a recent set of stool cards that were negative for blood.    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  Sincerely,  Katlynne Mckercher MD

## 2010-08-02 NOTE — Progress Notes (Signed)
  Phone Note Outgoing Call   Call placed by: Angeline Slim MD,  June 08, 2010 8:40 AM Call placed to: Patient Summary of Call: Called to check on pt.  She got message I left re worsening renal function.  She changed her HCTZ-Lisinopril as directed.  Will come in next week for labs.   Initial call taken by: Cat Ta MD,  June 08, 2010 8:40 AM

## 2010-08-02 NOTE — Assessment & Plan Note (Signed)
Summary: f/u eo   Vital Signs:  Patient profile:   60 year old female Height:      62 inches Weight:      111 pounds BMI:     20.38 BSA:     1.49 Temp:     98.8 degrees F oral Pulse rate:   56 / minute BP sitting:   127 / 52  (right arm) Cuff size:   regular  Vitals Entered By: Tessie Fass CMA (February 23, 2010 2:00 PM) CC: F/U Is Patient Diabetic? No   Primary Care Zachrey Deutscher:  Cat Ta MD  CC:  F/U.  History of Present Illness: 60 y/o F here for f/u of myalgias, hypotension.  Was seen two days ago by Darryll Capers.  BP meds have been held since Fri 8/19.    Feeling much better compared to last week.  But still feeling a little tired, wants to sleep a lot.  Menopause 37 yrs.  Still a little lightheaded when she stands up.  Drinking water liberally. Eating increased, but not as much as normally.  Weight loss of 1 lb since 2 days ago.  No fever, chills, vomiting, chest pain.  +lighthededness.    Etoh: Usually goes out on Sunday, drinks gin.  Usually drinks on Sun when they go to the club, 1-2 drinks (8 oz each).  then would drink left over on Mon and Tues.    Current Medications (verified): 1)  Lisinopril-Hydrochlorothiazide 20-12.5 Mg Tabs (Lisinopril-Hydrochlorothiazide) .... Take 2 Tablet By Mouth Once A Day Hold 2)  Norvasc 10 Mg Tabs (Amlodipine Besylate) .Marland Kitchen.. 1 Tab By Mouth Daily For Blood Pressure Hold 3)  Simvastatin 40 Mg Tabs (Simvastatin) .Marland Kitchen.. 1 Tab By Mouth At Bedtime Hold 4)  Aspirin 81 Mg Tabs (Aspirin) .... By Mouth Daily Hold 5)  Hydrocortisone 2.5 % Crea (Hydrocortisone) .... Apply To Legs Two Times A Day.  Dispense 30 Gram. 6)  Potassium Chloride Crys Cr 20 Meq Cr-Tabs (Potassium Chloride Crys Cr) .... One Tab Three Times A Day For 2 Days Then One Daily 7)  Famotidine 40 Mg Tabs (Famotidine) .... One Daily  Allergies (verified): 1)  ! Codeine  Review of Systems       per hpi   Physical Exam  General:  Well-developed,well-nourished,in no acute distress;  alert,appropriate and cooperative throughout examination. vitals reviewed.  Neurologic:  alert & oriented X3.     Impression & Recommendations:  Problem # 1:  HYPERTENSION, BENIGN SYSTEMIC (ICD-401.1) Assessment Improved  BP improved from 103-->110s-->120s.  Still holding all BP meds.  Pt drinking water liberally.  Appetite still has not resumed.  Luretha Murphy was concerned about alcohol intake.  Will continue to monitor.    Her updated medication list for this problem includes:    Lisinopril-hydrochlorothiazide 20-12.5 Mg Tabs (Lisinopril-hydrochlorothiazide) .Marland Kitchen... Take 2 tablet by mouth once a day hold    Norvasc 10 Mg Tabs (Amlodipine besylate) .Marland Kitchen... 1 tab by mouth daily for blood pressure hold  Orders: [facility]- Est Level  3 (09811)  Problem # 2:  HYPOKALEMIA (ICD-276.8) Will check B[facility] today.  Pt cont to take KCl daily.  Orders: Basic Met-[facility] ([id number]) [facility]- Est Level  3 (13086)  Problem # 3:  HEMATURIA UNSPECIFIED (ICD-599.70) Assessment: Comment Only Will need further work up when acute illness over.    Complete Medication List: 1)  Lisinopril-hydrochlorothiazide 20-12.5 Mg Tabs (Lisinopril-hydrochlorothiazide) .... Take 2 tablet by mouth once a day hold 2)  Norvasc 10 Mg Tabs (Amlodipine besylate) .Marland KitchenMarland KitchenMarland Kitchen 1  tab by mouth daily for blood pressure hold 3)  Simvastatin 40 Mg Tabs (Simvastatin) .Marland Kitchen.. 1 tab by mouth at bedtime hold 4)  Aspirin 81 Mg Tabs (Aspirin) .... By mouth daily hold 5)  Hydrocortisone 2.5 % Crea (Hydrocortisone) .... Apply to legs two times a day.  dispense 30 gram. 6)  Potassium Chloride Crys Cr 20 Meq Cr-tabs (Potassium chloride crys cr) .... One tab three times a day for 2 days then one daily 7)  Famotidine 40 Mg Tabs (Famotidine) .... One daily  Other Orders: TSH-[facility] ([id number])  Patient Instructions: 1)  Please schedule a follow-up appointment with Dr Janalyn Harder next Friday. 2)  No blood pressure medicine until I see you again next week. 3)  Continue your  potassium. 4)  Continue your stomach medicine (Famotidine). 5)  Eat what you can.  6)  I'll call you tomorrow with lab results.     Prevention & Chronic Care Immunizations   Influenza vaccine: Not documented   Influenza vaccine deferral: Refused  (06/18/2009)    Tetanus booster: 07/03/2005: Done.    Pneumococcal vaccine: Not documented  Colorectal Screening   Hemoccult: Not documented   Hemoccult action/deferral: Ordered  (06/18/2009)    Colonoscopy: Not documented   Colonoscopy action/deferral: GI Referral  (08/04/2009)  Other Screening   Pap smear: NEGATIVE FOR INTRAEPITHELIAL LESIONS OR MALIGNANCY.  (06/18/2009)   Pap smear action/deferral: Deferred-3 yr interval  (06/23/2009)   Pap smear due: 06/23/2012    Mammogram: ASSESSMENT: Negative - BI-RADS 1^MM DIGITAL SCREENING  (08/11/2009)   Mammogram action/deferral: Ordered  (08/04/2009)   Mammogram due: 08/11/2010   Smoking status: current  (02/18/2010)   Smoking cessation counseling: YES  (12/13/2009)  Lipids   Total Cholesterol: 210  (03/23/2009)   LDL: 118  (03/23/2009)   LDL Direct: Not documented   HDL: 77  (03/23/2009)   Triglycerides: 74  (03/23/2009)  Hypertension   Last Blood Pressure: 127 / 52  (02/23/2010)   Serum creatinine: 1.15  (02/21/2010)   BMP action: Ordered   Serum potassium 2.7  (02/21/2010)    Hypertension flowsheet reviewed?: Yes   Progress toward BP goal: Unchanged  Self-Management Support :   Personal Goals (by the next clinic visit) :      Personal blood pressure goal: 140/90  (08/04/2009)   Hypertension self-management support: Written self-care plan  (12/13/2009)

## 2010-08-02 NOTE — Assessment & Plan Note (Signed)
Summary: rash,tcb   Vital Signs:  Patient profile:   60 year old female Weight:      166.2 pounds BMI:     30.51 Temp:     98.3 degrees F BP sitting:   144 / 72  (left arm)  Vitals Entered By: Starleen Blue RN (August 27, 2009 9:27 AM) CC: rash Is Patient Diabetic? No Pain Assessment Patient in pain? no        Primary Care Provider:  Cat Ta MD  CC:  rash.  History of Present Illness: 60 y/o female c/o 6-8 weeks of pruritic rash. husband has also. no new detergents, soaps, lotions. recently purchased new mattress. rash started on legs, also in interior thigh, underneath breasts. tried oral and topical benadryl without relief. no fevers, chills or other systemic symptoms.   Habits & Providers  Alcohol-Tobacco-Diet     Tobacco Status: current     Tobacco Counseling: to quit use of tobacco products     Cigarette Packs/Day: 0.5  Current Medications (verified): 1)  Lisinopril-Hydrochlorothiazide 20-12.5 Mg Tabs (Lisinopril-Hydrochlorothiazide) .... Take 2 Tablet By Mouth Once A Day 2)  Norvasc 10 Mg Tabs (Amlodipine Besylate) .Marland Kitchen.. 1 Tab By Mouth Daily For Blood Pressure 3)  Simvastatin 40 Mg Tabs (Simvastatin) .Marland Kitchen.. 1 Tab By Mouth At Bedtime 4)  Aspirin 81 Mg Tabs (Aspirin) .... By Mouth Daily  Allergies (verified): 1)  ! Codeine  Social History: Packs/Day:  0.5  Physical Exam  General:  Well-developed,well-nourished,in no acute distress; alert,appropriate and cooperative throughout examination. vitals reviewed.   Skin:  papular rash on bilateral legs, upper thighs, underneath breasts. no lesions on hands or feet or wrists.    Impression & Recommendations:  Problem # 1:  SKIN RASH (ICD-782.1) Assessment New  history concerning for scabies. will treat with permethrin. two applications given so that husband may use as well. hydroxyzine for pruritus.   Orders: [facility]- Est Level  3 (16109)  Complete Medication List: 1)  Lisinopril-hydrochlorothiazide 20-12.5 Mg  Tabs (Lisinopril-hydrochlorothiazide) .... Take 2 tablet by mouth once a day 2)  Norvasc 10 Mg Tabs (Amlodipine besylate) .Marland Kitchen.. 1 tab by mouth daily for blood pressure 3)  Simvastatin 40 Mg Tabs (Simvastatin) .Marland Kitchen.. 1 tab by mouth at bedtime 4)  Aspirin 81 Mg Tabs (Aspirin) .... By mouth daily 5)  Permethrin 5 % Crea (Permethrin) .... Apply to entire body and leave on for 12 hours then rinse. disp 2 applications. 6)  Hydroxyzine Hcl 50 Mg Tabs (Hydroxyzine hcl) .... 1/2 to 8full tablet every 12 hours as needed for itching. Prescriptions: HYDROXYZINE HCL 50 MG TABS (HYDROXYZINE HCL) 1/2 to 72full tablet every 12 hours as needed for itching.  #45 x 0   Entered and Authorized by:   Lequita Asal  MD   Signed by:   Lequita Asal  MD on 08/27/2009   Method used:   Electronically to        [facility] [phone]* (retail)       [address]       [place], [place]  [address]       Ph: [phone]       Fax: [phone]   RxID:   [id number] PERMETHRIN 5 % CREA (PERMETHRIN) apply to entire body and leave on for 12 hours then rinse. disp 2 applications.  #2 x 0   Entered and Authorized by:   Lequita Asal  MD   Signed by:   Lequita Asal  MD on 08/27/2009   Method used:  Electronically to        [facility] [phone]* (retail)       [address]       [place], [place]  [address]       Ph: [phone]       Fax: [phone]   RxID:   [id number]

## 2010-08-02 NOTE — Progress Notes (Signed)
Summary: 24-hr urine collection   Phone Note Outgoing Call   Call placed by: Cat Ta MD,  April 20, 2010 1:58 PM Call placed to: Patient Summary of Call: Left message for pt about CT abd and Renal U/S: wnl Now needs 24hr urine collection. Pt to call [facility] and schedule lab appt for this.   Initial call taken by: Cat Ta MD,  April 20, 2010 1:59 PM

## 2010-08-02 NOTE — Assessment & Plan Note (Signed)
Summary: f/u   Vital Signs:  Patient profile:   60 year old female Height:      62 inches Weight:      114 pounds BMI:     20.93 Temp:     98.8 degrees F oral Pulse rate:   69 / minute Pulse (ortho):   53 / minute BP sitting:   164 / 65  (left arm) BP standing:   182 / 75 Cuff size:   regular  Vitals Entered By: Jimmy Footman, CMA (March 04, 2010 2:01 PM)  Serial Vital Signs/Assessments:  Time      Position  BP       Pulse  Resp  Temp     By 1445      Lying RA  165/74   58                    Dennison Nancy RN 1445      Sitting   167/69   52                    Dennison Nancy RN 1445      Standing  182/75   53                    Dennison Nancy RN  CC: f/u labs and BP Is Patient Diabetic? No Pain Assessment Patient in pain? no        Primary Care Provider:  Amauri Medellin MD  CC:  f/u labs and BP.  History of Present Illness: Mrs. Lisa Taylor is a very pleasant 60 y/o with HTN, HLD, GERD who presents for f/u of hypotension and hypokalemia.  About 3 wks ago pt developed what seems like a viral illness, with fever, vomiting and myalgias.  Prior to this she has been dealing with a cough lasting almost 1 month.  On exam she was hypotensive to 100s/40s where baseline BPs are 140s/70-80s, was in acute renal failure (cr 1.59), and hypokalemic (K 2.9), hypochloremic.  BP meds have been held, pt was encouraged to take liberal PO intake, pt was repleted with KCl.  Since then pt's renal function has returned to baseline (Cr 0.76), K 3.7.  Patient states that she feels better, but still having lightheaded whenever she stands up to quickly from sitting position.  She states that she she is currently lightheaded from walking into the room from the waiting room.  She denies chest pain/pressure, some dyspnea with exertion, no swelling, no palpitations, no syncope/presyncope.          Habits & Providers  Alcohol-Tobacco-Diet     Tobacco Status: current     Tobacco Counseling: to quit use of tobacco  products  Current Medications (verified): 1)  Lisinopril-Hydrochlorothiazide 20-12.5 Mg Tabs (Lisinopril-Hydrochlorothiazide) .... Take 2 Tablet By Mouth Once A Day Hold 2)  Norvasc 10 Mg Tabs (Amlodipine Besylate) .Marland Kitchen.. 1 Tab By Mouth Daily For Blood Pressure Hold 3)  Simvastatin 40 Mg Tabs (Simvastatin) .Marland Kitchen.. 1 Tab By Mouth At Bedtime Hold 4)  Aspirin 81 Mg Tabs (Aspirin) .... By Mouth Daily Hold 5)  Hydrocortisone 2.5 % Crea (Hydrocortisone) .... Apply To Legs Two Times A Day.  Dispense 30 Gram. 6)  Potassium Chloride Crys Cr 20 Meq Cr-Tabs (Potassium Chloride Crys Cr) .... One Tab Three Times A Day For 2 Days Then One Daily 7)  Famotidine 40 Mg Tabs (Famotidine) .... One Daily  Allergies (verified): 1)  ! 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 Abnormal Stress EKG 12/2005 HTN Graves dz s/p ablation Eczema GERD   Past Surgical History: Last updated: 06/19/2009 -cyst behind left ear removed by general surgery 1992 -Left rotator cuff repair 2003 -Left foot surgery after falling 2000  Family History: Last updated: 2009/06/19 father - deceased at 69 from CVA mother - deceased (scleroderma, DM, RA, HTN) sister -deceased w/ DM, polysubstance abuse No Breast Cancer No GYN cancer No colon cancer  Social History: Last updated: 03/04/2010 [employer]; Smokes 3-4 cigs/day (10yrs), occ EtOH (drinks on weekend), lives with husband Has 3 children, 17 grand children  Code Status:  Full Code  Risk Factors: Alcohol Use: <1 (2009-06-19) Exercise: yes (09/22/2009)  Risk Factors: Smoking Status: current (03/04/2010) Packs/Day: 0.5 (12/13/2009)  Social History: [employer]; Smokes 3-4 cigs/day (60yrs), occ EtOH (drinks on weekend), lives with husband Has 3 children, 17 grand children  Code Status:  Full Code  Review of Systems General:   Denies chills, fatigue, fever, and sweats. CV:  Complains of fatigue and lightheadness; denies bluish discoloration of lips or nails, chest pain or discomfort, difficulty breathing at night, difficulty breathing while lying down, fainting, leg cramps with exertion, near fainting, palpitations, shortness of breath with exertion, swelling of feet, swelling of hands, and weight gain. Resp:  Complains of cough; denies chest discomfort, chest pain with inspiration, coughing up blood, shortness of breath, and sputum productive.  Physical Exam  General:  Well-developed,well-nourished,in no acute distress; alert,appropriate and cooperative throughout examination. vitals reviewed.  Neck:  supple, full ROM, and no masses.   Lungs:  Normal respiratory effort, chest expands symmetrically. Lungs are clear to auscultation, no crackles or wheezes. Heart:  Huston Foley, regular rhythm. S1 and S2 normal without gallop, murmur, click, rub or other extra sounds. Pulses:  R radial normal, R dorsalis pedis normal, R carotid normal, L radial normal, L dorsalis pedis normal, and L carotid normal.   Extremities:  No clubbing, cyanosis, edema, or deformity noted with normal full range of motion of all joints.   Neurologic:  alert & oriented X3 and cranial nerves II-XII intact.   Skin:  Intact without suspicious lesions or rashes Cervical Nodes:  No lymphadenopathy noted   Impression & Recommendations:  Problem # 1:  HYPERTENSION, BENIGN SYSTEMIC (ICD-401.1) BP today in the 160s/60s-70s.  Have been holding BP meds since 8/19.  Will resume Lisinopril 20/HCTZ 12.5 once a day (pt was taking 2 tabs daily).   Her updated medication list for this problem includes:    Lisinopril-hydrochlorothiazide 20-12.5 Mg Tabs (Lisinopril-hydrochlorothiazide) .Marland Kitchen... Take 2 tablet by mouth once a day    Norvasc 10 Mg Tabs (Amlodipine besylate) .Marland Kitchen... 1 tab by mouth daily for blood pressure hold  Orders: Basic Met-[facility] ([address]-60454) CBC-[facility]  (09811) [facility]- Est  Level 4 (91478)  Problem # 2:  BRADYCARDIA (ICD-427.89) Pt is not a BB.  In previous visits her HR has averaged 70s.  Today EKG showed HR in the 40s.  This can explain her postural lightheadedness.  I do not have a clear understanding of why she is bradycardic.  Her history shows a failed stress test in 2007, but I do not have current records of this.  She also had a normal Echo read by Dr Eden Emms in 01/2006.  At this time I would like to refer pt to Dr Eden Emms for further evaluation.  Pt is instructed to go to  ER if she feels worse this weekend.   Her updated medication list for this problem includes:    Aspirin 81 Mg Tabs (Aspirin) ..... By mouth daily hold  Orders: Miscellaneous Lab Charge-[facility] (16109) [facility]- Est  Level 4 (60454)  Problem # 3:  POSTURAL LIGHTHEADEDNESS (ICD-780.4) Assessment: New See above Orders: Basic Met-[facility] (09811-91478) [facility]- Est  Level 4 (29562)  Problem # 4:  HYPOKALEMIA (ICD-276.8) Assessment: Improved Rechecking Bmet again today.  Pt to continue KCl 20 daily.  Will call pt if I want her to stop taking this.  Given cardiac concerns, K should be >4.0.   Orders: Basic Met-[facility] ([id number]) [facility]- Est  Level 4 (96295)  Complete Medication List: 1)  Lisinopril-hydrochlorothiazide 20-12.5 Mg Tabs (Lisinopril-hydrochlorothiazide) .... Take 2 tablet by mouth once a day 2)  Norvasc 10 Mg Tabs (Amlodipine besylate) .Marland Kitchen.. 1 tab by mouth daily for blood pressure hold 3)  Simvastatin 40 Mg Tabs (Simvastatin) .Marland Kitchen.. 1 tab by mouth at bedtime hold 4)  Aspirin 81 Mg Tabs (Aspirin) .... By mouth daily hold 5)  Hydrocortisone 2.5 % Crea (Hydrocortisone) .... Apply to legs two times a day.  dispense 30 gram. 6)  Potassium Chloride Crys Cr 20 Meq Cr-tabs (Potassium chloride crys cr) .... One tab three times a day for 2 days then one daily 7)  Famotidine 40 Mg Tabs (Famotidine) .... One daily  Other Orders: Urinalysis-[facility] (00000) EKG- [facility] (EKG) Cardiology Referral  (Cardiology)  Laboratory Results   Urine Tests  Date/Time Received: March 04, 2010 2:55 PM  Date/Time Reported: March 04, 2010 3:42 PM   Routine Urinalysis   Color: yellow Appearance: Clear Glucose: negative   (Normal Range: Negative) Bilirubin: negative   (Normal Range: Negative) Ketone: negative   (Normal Range: Negative) Spec. Gravity: 1.025   (Normal Range: 1.003-1.035) Blood: trace-lysed   (Normal Range: Negative) pH: 5.5   (Normal Range: 5.0-8.0) Protein: negative   (Normal Range: Negative) Urobilinogen: 0.2   (Normal Range: 0-1) Nitrite: negative   (Normal Range: Negative) Leukocyte Esterace: negative   (Normal Range: Negative)  Urine Microscopic WBC/HPF: 0-2 RBC/HPF: 0-2 Bacteria/HPF: trace Mucous/HPF: trace Epithelial/HPF: 1-5    Comments: ...........test performed by...........Marland KitchenTerese Door, CMA

## 2010-08-02 NOTE — Assessment & Plan Note (Signed)
Summary: np6/lightheadiness,brady-mb  Medications Added NORVASC 10 MG TABS (AMLODIPINE BESYLATE) 1 tab by mouth daily for blood pressure. POTASSIUM CHLORIDE CRYS CR 20 MEQ CR-TABS (POTASSIUM CHLORIDE CRYS CR) one tab three times a day for 2 days then one daily as needed ASPIRIN 81 MG TBEC (ASPIRIN) Take one tablet by mouth daily        Visit Type:  Initial Consult Primary Treyon Wymore:  Cat Ta MD  CC:  lightheadedness- bradycardia.  History of Present Illness: 60 year old female with no prior cardiac history for evaluation of bradycardia. Echocardiogram in 2007 showed normal LV function, mild left atrial enlargement, mild tricuspid regurgitation and trace mitral regurgitation. Recent TSH and cortisol level normal. Patient seen in early September for management of blood pressure. Her heart rate was 48 by report. Because of her bradycardia we were asked to further evaluate. The patient denies any dyspnea on exertion, orthopnea, PND, pedal edema, palpitations, syncope or chest pain. She occasionally feels mild lightheadedness with standing that resolves spontaneously.  Current Medications (verified): 1)  Lisinopril-Hydrochlorothiazide 20-12.5 Mg Tabs (Lisinopril-Hydrochlorothiazide) .... Take 2 Tablet By Mouth Once A Day 2)  Norvasc 10 Mg Tabs (Amlodipine Besylate) .Marland Kitchen.. 1 Tab By Mouth Daily For Blood Pressure Hold 3)  Simvastatin 40 Mg Tabs (Simvastatin) .Marland Kitchen.. 1 Tab By Mouth At Bedtime Hold 4)  Hydrocortisone 2.5 % Crea (Hydrocortisone) .... Apply To Legs Two Times A Day.  Dispense 30 Gram. 5)  Potassium Chloride Crys Cr 20 Meq Cr-Tabs (Potassium Chloride Crys Cr) .... One Tab Three Times A Day For 2 Days Then One Daily As Needed 6)  Famotidine 40 Mg Tabs (Famotidine) .... One Daily 7)  Aspirin 81 Mg Tbec (Aspirin) .... Take One Tablet By Mouth Daily  Allergies: 1)  ! Codeine  Past History:  Past Medical History: Hypertension GERD Cancer removed from face 1992 Frozen shoulder - chronic  left rotator cuff issues Graves dz s/p ablation Eczema Borderline DM Hyperlipidemia Murmur  Past Surgical History: -cyst behind left ear removed (general surgery) 1992 -Left rotator cuff repair 2003 -Left foot surgery after falling 2000  Family History: Reviewed history from 03/04/2010 and no changes required. Family History: diabetes, hypertension, stroke  father - deceased at 54 from CVA mother - deceased (scleroderma, DM, RA, HTN) sister -deceased w/ DM, polysubstance abuse Brother with unknown heart disease No Breast Cancer No GYN cancer No colon cancer  Social History: Reviewed history from 03/04/2010 and no changes required.  Smokes 3-4 cigs/day (37yrs), occ EtOH, lives with husband Has 3 children, 56 grand children  Review of Systems       Some pain in her ankle but no fevers or chills, productive cough, hemoptysis, dysphasia, odynophagia, melena, hematochezia, dysuria, hematuria, rash, seizure activity, orthopnea, PND, pedal edema, claudication. Remaining systems are negative.   Vital Signs:  Patient profile:   60 year old female Height:      62 inches Weight:      116.75 pounds BMI:     21.43 Pulse rate:   52 / minute Pulse rhythm:   regular Resp:     18 per minute BP sitting:   164 / 78  (left arm) Cuff size:   regular  Vitals Entered By: Vikki Ports (March 08, 2010 10:56 AM)  Physical Exam  General:  Well developed/well nourished in NAD Skin warm/dry Patient not depressed No peripheral clubbing Back-normal HEENT-normal/normal eyelids Neck supple/normal carotid upstroke bilaterally; no bruits; no JVD; no thyromegaly chest - CTA/ normal expansion CV - RRR/normal S1 and  S2; Positive S4. 2/6 systolic murmur at the apex.  PMI nondisplaced Abdomen -NT/ND, no HSM, no mass, + bowel sounds, no bruit 2+ femoral pulses, no bruits Ext-no edema, chords, 2+ DP Neuro-grossly nonfocal     EKG  Procedure date:  03/08/2010  Findings:      Sinus  bradycardia at a rate of 59. Axis normal. No significant ST changes.  EKG  Procedure date:  03/04/2010  Findings:      Sinus bradycardia at a rate of 48. No ST changes.  Impression & Recommendations:  Problem # 1:  BRADYCARDIA (ICD-427.89) Patient has mild bradycardia. She has no symptoms including no chest pain, shortness of breath or history of syncope. No further workup indicated. The following medications were removed from the medication list:    Aspirin 81 Mg Tabs (Aspirin) ..... By mouth daily hold Her updated medication list for this problem includes:    Lisinopril-hydrochlorothiazide 20-12.5 Mg Tabs (Lisinopril-hydrochlorothiazide) .Marland Kitchen... Take 2 tablet by mouth once a day    Norvasc 10 Mg Tabs (Amlodipine besylate) .Marland Kitchen... 1 tab by mouth daily for blood pressure.    Aspirin 81 Mg Tbec (Aspirin) .Marland Kitchen... Take one tablet by mouth daily  Orders: EKG w/ Interpretation (93000)  Problem # 2:  CARDIAC MURMUR (ICD-785.2)  Check echocardiogram. Probable mild MR murmur. Her updated medication list for this problem includes:    Lisinopril-hydrochlorothiazide 20-12.5 Mg Tabs (Lisinopril-hydrochlorothiazide) .Marland Kitchen... Take 2 tablet by mouth once a day  Orders: Echocardiogram (Echo)  Her updated medication list for this problem includes:    Lisinopril-hydrochlorothiazide 20-12.5 Mg Tabs (Lisinopril-hydrochlorothiazide) .Marland Kitchen... Take 2 tablet by mouth once a day  Problem # 3:  HYPERTENSION, BENIGN SYSTEMIC (ICD-401.1) Blood pressure elevated. However she is not taking her lisinopril HCT or her Norvasc. Resume Norvasc at 10 mg p.o. daily and followup with her primary care physician. The following medications were removed from the medication list:    Aspirin 81 Mg Tabs (Aspirin) ..... By mouth daily hold Her updated medication list for this problem includes:    Lisinopril-hydrochlorothiazide 20-12.5 Mg Tabs (Lisinopril-hydrochlorothiazide) .Marland Kitchen... Take 2 tablet by mouth once a day    Norvasc 10 Mg  Tabs (Amlodipine besylate) .Marland Kitchen... 1 tab by mouth daily for blood pressure.    Aspirin 81 Mg Tbec (Aspirin) .Marland Kitchen... Take one tablet by mouth daily  Problem # 4:  TOBACCO USER (ICD-305.1) Patient counseled on discontinuing between 3-10 minutes.  Patient Instructions: 1)  Your physician recommends that you schedule a follow-up appointment in: as needed 2)  Your physician has recommended you make the following change in your medication: Start taken Norvasc 10 mg one tablet daily 3)  Your physician has requested that you have an echocardiogram.  Echocardiography is a painless test that uses sound waves to create images of your heart. It provides your doctor with information about the size and shape of your heart and how well your heart's chambers and valves are working.  This procedure takes approximately one hour. There are no restrictions for this procedure.

## 2010-08-02 NOTE — Miscellaneous (Signed)
Summary: Re: GI Referral   Clinical Lists Changes  Received notification from [facility] that they are unable to complete the GI referral due to lack of volunteer physicians in this speciality group. They will notifiy patient when they can process this referral. Theresia Lo RN  August 31, 2009 2:35 PM

## 2010-08-02 NOTE — Miscellaneous (Signed)
Summary: Changing Prob List   Clinical Lists Changes  Problems: Removed problem of PHYSICAL EXAMINATION (ICD-V70.0) Removed problem of SCREENING FOR MALIGNANT NEOPLASM OF THE CERVIX (ICD-V76.2) Removed problem of SPECIAL SCREENING FOR MALIGNANT NEOPLASMS COLON (ICD-V76.51) Removed problem of IMPAIRED FASTING GLUCOSE (ICD-790.21) Removed problem of POSTURAL LIGHTHEADEDNESS (ICD-780.4) Removed problem of BRADYCARDIA (ICD-427.89)

## 2010-08-02 NOTE — Assessment & Plan Note (Signed)
Summary: f/u HTN   Vital Signs:  Patient profile:   60 year old female Height:      62 inches Weight:      116 pounds BMI:     21.29 Temp:     98.6 degrees F oral Pulse rate:   82 / minute BP sitting:   153 / 62  (left arm)  Vitals Entered By: Angeline Slim MD (August 04, 2009 4:41 PM) CC: F/U HTN Is Patient Diabetic? No Pain Assessment Patient in pain? no        Primary Care Provider:  Mareo Portilla MD  CC:  F/U HTN.  History of Present Illness: cc: f/u htn  60 y/o F here for f/u of BP.  Pt's BP at last OV was elevated but we attributed it to missed doses.    HYPERTENSION *Disease Monitoring Blood pressure range: does not check  *Medications Taking HCTZ 12.5/Lisinopril 2 tabs daily, Norvasc 5mg  daily Compliance: yes, states she has not missed any doses since last visit *Prevention Exercise:yes, she takes walks with her husband  Salt restriction: yes, she does not eat canned foods, she does not add salt to her foods Chest pain: Dyspnea: Lightheadedness: no  Edema: no  Tobacco abuse:  Pt has decreased to 1 cigarettes per day.    Screening:   MMG ordered.  Gave pt form to take to [facility]. Colonoscopy referral made, but pt has Jaynee Eagles so we will have to wait for [facility] to be avail. Pt turned in Hemoccult cards today.     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 Take 2 Tablet By Mouth Once A Day 2)  Norvasc 10 Mg Tabs (Amlodipine Besylate) .Marland Kitchen.. 1 Tab By Mouth Daily For Blood Pressure 3)  Simvastatin 40 Mg Tabs (Simvastatin) .Marland Kitchen.. 1 Tab By Mouth At Bedtime 4)  Aspirin 81 Mg Tabs (Aspirin) .... By Mouth Daily  Allergies (verified): 1)  ! Codeine  Past History:  Family History: Last updated: 07/17/09 father - deceased at 77 from CVA mother - deceased (scleroderma, DM, RA, HTN) sister -deceased w/ DM, polysubstance abuse No Breast Cancer No GYN cancer No colon cancer  Social History: Packs/Day:  <0.25 Does Patient Exercise:  yes  Review of Systems       per hpi  Physical Exam  General:  Well-developed,well-nourished,in no acute distress; alert,appropriate and cooperative throughout examination. vitals reviewed.   Head:  normocephalic and atraumatic.   Mouth:  mmm Neck:  supple and full ROM.   Lungs:  Normal respiratory effort, chest expands symmetrically. Lungs are clear to auscultation, no crackles or wheezes. Heart:  Normal rate and regular rhythm. S1 and S2 normal without gallop, murmur,  click, rub or other extra sounds. Abdomen:  Bowel sounds positive,abdomen soft and non-tender without masses, organomegaly or hernias noted. Extremities:  No clubbing, cyanosis, edema Neurologic:  alert & oriented X3.   Cervical Nodes:  No lymphadenopathy noted   Impression & Recommendations:  Problem # 1:  HYPERTENSION, BENIGN SYSTEMIC (ICD-401.1) Assessment Unchanged BP still elevated.  Not at goal.  Will increase Norvasc to 10mg  daily.  Pt to finish out current pack of Norvasc 5mg  by taking 2 tabs per day, then pick up new Rx.  Pt to rtc in 4-6wks for f/u.   Her updated medication list for this problem  includes:    Lisinopril-hydrochlorothiazide 20-12.5 Mg Tabs (Lisinopril-hydrochlorothiazide) .Marland Kitchen... Take 2 tablet by mouth once a day    Norvasc 10 Mg Tabs (Amlodipine besylate) .Marland Kitchen... 1 tab by mouth daily for blood pressure  Orders: [facility]- Est Level  3 (16109)  Problem # 2:  TOBACCO USER (ICD-305.1) Assessment: Improved Pt smoking 1 cigarette per day.  Advised pt to quit.   Orders: [facility]- Est Level  3 (60454)  Complete Medication List: 1)  Lisinopril-hydrochlorothiazide 20-12.5 Mg Tabs (Lisinopril-hydrochlorothiazide) .... Take 2 tablet by mouth once a day 2)  Norvasc 10 Mg Tabs (Amlodipine besylate) .Marland Kitchen.. 1 tab by mouth daily for blood pressure 3)  Simvastatin 40 Mg Tabs (Simvastatin) .Marland Kitchen.. 1 tab by mouth at bedtime 4)  Aspirin 81 Mg Tabs (Aspirin) .... By mouth daily  Other Orders: Mammogram (Screening) (Mammo) Colonoscopy (Colon)  Patient Instructions: 1)  Please schedule a follow-up appointment in 4-6 weeks for blood pressure.  2)  Amlodipine 5mg : take 2 tablets daily until you are done with this pack. 3)  Then you will take Amlodipine 10mg  (one tablet daily). 4)  Continue your other medications as directed. 5)  Tobacco is very bad for your health and your loved ones ! You should stop smoking !  6)  Stop smoking tips: Choose a quit date. Cut down before the quit date. Decide what you will do as a substitute when you feel the urge to smoke(gum, toothpick, exercise).  7)  Schedule your mammogram.  Prescriptions: NORVASC 10 MG TABS (AMLODIPINE BESYLATE) 1 tab by mouth daily for blood pressure  #34 x 6   Entered and Authorized by:   Angeline Slim MD   Signed by:   Angeline Slim MD on 08/04/2009   Method used:   Electronically to        [facility] [phone]* (retail)       [address]       [place], [place]  [address]       Ph: [phone]       Fax: [phone]   RxID:   [id number]    Prevention & Chronic Care Immunizations   Influenza vaccine: Not documented   Influenza  vaccine deferral: Refused  (06/18/2009)    Tetanus booster: 07/03/2005: Done.    Pneumococcal vaccine: Not documented  Colorectal Screening   Hemoccult: Not documented   Hemoccult action/deferral: Ordered  (06/18/2009)    Colonoscopy: Not documented   Colonoscopy action/deferral: GI Referral  (08/04/2009)  Other Screening   Pap smear: NEGATIVE FOR INTRAEPITHELIAL LESIONS OR MALIGNANCY.  (06/18/2009)   Pap smear action/deferral: Deferred-3 yr interval  (06/23/2009)   Pap smear due: 06/23/2012    Mammogram: Not documented   Mammogram action/deferral: Ordered  (08/04/2009)   Smoking status: current  (08/04/2009)   Smoking cessation counseling: YES  (08/04/2009)  Lipids   Total Cholesterol: 210  (03/23/2009)  LDL: 118  (03/23/2009)   LDL Direct: Not documented   HDL: 77  (03/23/2009)   Triglycerides: 74  (03/23/2009)  Hypertension   Last Blood Pressure: 153 / 62  (08/04/2009)   Serum creatinine: 1.31  (03/23/2009)   BMP action: Ordered   Serum potassium 4.5  (03/23/2009)    Hypertension flowsheet reviewed?: Yes   Progress toward BP goal: Unchanged  Self-Management Support :   Personal Goals (by the next clinic visit) :      Personal blood pressure goal: 140/90  (08/04/2009)   Hypertension self-management support: Written self-care plan  (08/04/2009)   Hypertension self-care plan printed.   Nursing Instructions: Schedule screening mammogram (see order) Screening colonoscopy ordered   Appended Document: hemoccult cards     Lab Visit  Laboratory Results  Date/Time Received: August 04, 2009  Date/Time Reported: August 04, 2009 5:30 PM   Stool - Occult Blood Hemmoccult #1: negative Date: 07/31/2009 Hemoccult #2: negative Date: 08/02/2009 Hemoccult #3: negative Date: 08/03/2009 Comments: ...............test performed by......Marland KitchenBonnie A. [place], MLS (ASCP)cm   Orders Today:

## 2010-08-02 NOTE — Assessment & Plan Note (Signed)
Summary: Hematuria   Vital Signs:  Patient profile:   60 year old female Height:      62 inches Weight:      120 pounds BMI:     22.03 Temp:     98.3 degrees F oral Pulse rate:   62 / minute BP sitting:   142 / 64  (right arm) Cuff size:   regular  Vitals Entered By: Jimmy Footman, CMA (April 13, 2010 10:45 AM) CC: f/u Is Patient Diabetic? No Pain Assessment Patient in pain? no        Primary Care Provider:  Cat Ta MD  CC:  f/u.  History of Present Illness: 60 y/o F here to discuss hematuria seen on UA when pt presented weeks ago with ARF secondary to dehydration from vomiting, diarrhea. At that time Cr was 1.59, with baseline 0.8-1.1.  Renal function has since returned to baseline.  Pt has risk factors of HTN (renal) and Tobacco use (bladder).    Routine Urinalysis  02/18/10 Color: yellow Appearance: Clear Glucose: negative   (Normal Range: Negative) Bilirubin: moderate-icto negative   (Normal Range: Negative) Ketone: negative   (Normal Range: Negative) Spec. Gravity: 1.020   (Normal Range: 1.003-1.035) Blood: moderate   (Normal Range: Negative) pH: 5.5   (Normal Range: 5.0-8.0) Protein: 100   (Normal Range: Negative) Urobilinogen: 2.0   (Normal Range: 0-1) Nitrite: negative   (Normal Range: Negative) Leukocyte Esterace: negative   (Normal Range: Negative)  Urine Microscopic  WBC/HPF: 1-5 RBC/HPF: 5-20 Bacteria/HPF: 3+ Epithelial/HPF: 10-20 Casts/LPF: occ hyaline Other: mod amorphous  Habits & Providers  Alcohol-Tobacco-Diet     Alcohol drinks/day: <1     Alcohol Counseling: not indicated; patient does not drink     Tobacco Status: current     Tobacco Counseling: to quit use of tobacco products     Cigarette Packs/Day: 1.0     Year Started: 1968  Current Medications (verified): 1)  Lisinopril-Hydrochlorothiazide 20-12.5 Mg Tabs (Lisinopril-Hydrochlorothiazide) .... Take 2 Tablet By Mouth Once A Day 2)  Norvasc 10 Mg Tabs (Amlodipine Besylate) .Marland Kitchen.. 1 Tab  By Mouth Daily For Blood Pressure. 3)  Simvastatin 40 Mg Tabs (Simvastatin) .Marland Kitchen.. 1 Tab By Mouth At Bedtime Hold 4)  Hydrocortisone 2.5 % Crea (Hydrocortisone) .... Apply To Legs Two Times A Day.  Dispense 30 Gram. 5)  Potassium Chloride Crys Cr 20 Meq Cr-Tabs (Potassium Chloride Crys Cr) .... One Tab Three Times A Day For 2 Days Then One Daily As Needed 6)  Famotidine 40 Mg Tabs (Famotidine) .... One Daily 7)  Aspirin 81 Mg Tbec (Aspirin) .... Take One Tablet By Mouth Daily  Allergies (verified): 1)  ! 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 The EF 60% to 65%. Mild mitral regurgitation.  Family History: Last updated: 03/08/2010 Family History: diabetes, hypertension, stroke  father - deceased at 79 from CVA mother - deceased (scleroderma, DM, RA, HTN) sister -deceased w/ DM, polysubstance abuse Brother with unknown heart disease No Breast Cancer No GYN cancer No colon cancer  Social History: Last updated: 03/04/2010  Smokes 3-4 cigs/day (65yrs), occ EtOH, lives with husband  Has 3 children, 17 grand children  Risk Factors: Alcohol Use: <1 (04/13/2010) Exercise: yes (09/22/2009)  Risk Factors: Smoking Status: current (04/13/2010) Packs/Day: 1.0 (04/13/2010)  Review of Systems General:  Denies chills, fatigue, fever, loss of appetite, malaise, sleep disorder, sweats, weakness, and weight loss. CV:  Denies chest pain or discomfort, fainting, fatigue, lightheadness, and near fainting. Resp:  Denies chest discomfort, chest pain  with inspiration, cough, shortness of breath, and wheezing. GI:  Denies abdominal pain, bloody stools, change in bowel habits, constipation, diarrhea, nausea, and vomiting. GU:  Denies abnormal vaginal bleeding, dysuria, hematuria, and incontinence.  Physical Exam  General:  Well-developed,well-nourished,in no acute distress; alert,appropriate and cooperative throughout examination. vitals reviewed.  Lungs:  Normal respiratory effort, chest expands symmetrically. Lungs are clear to auscultation, no crackles or wheezes. Heart:  Normal rate and regular rhythm. S1 and S2 normal without gallop, 3/6 systolic murmur, click, rub or other extra sounds. No bruit.  Abdomen:  Bowel sounds positive,abdomen soft and non-tender without masses, organomegaly or hernias noted. Pulses:  equal bilaterally Extremities:  No clubbing, cyanosis, edema, or deformity noted with normal full range of motion of all joints.   Neurologic:  alert & oriented X3.     Impression & Recommendations:  Problem # 1:  HEMATURIA UNSPECIFIED (ICD-599.70) Assessment New Microscopic hematuria present in a 60 y/o F with HTN, Tobacco dependence, Grave's D.  Originally (02/18/10) gross hematuria was present in setting of ARF.  Renal function has returned to normal s/p recent ARF secondary to dehydration from vomiting and diarrhea.  Baseline Cr 0.8 - 1.1.  Today there is intact trace blood seen on UA.  Etiology include infection (not likely since pt asymptomatic, no nitrites, no leuks and UA), stones (asymptomatic), malignancy of kidney or urinary tract.  Could this be post infectious glomerulonephritis as pt's orginal symptoms in Aug may have started as URI.  Likely no glomerular in etiology since there was not Red Cell Casts or protein seen in UA.    Will get renal U/S.  Will need 24-hr urine collection to evaluate quantity of protein.  Consider Renal consult?  Consider: Cystocoy (bladder malignancy), Renal Bx (glomerular d),     Orders: Urinalysis-[facility] (00000) Ultrasound (Ultrasound) CT with Contrast (CT w/ contrast) [facility]- Est Level  3 (99213)Future Orders: 24hr. Urine TP- [facility] [phone]) ... 04/14/2011  Complete Medication List: 1)  Lisinopril-hydrochlorothiazide 20-12.5 Mg Tabs (Lisinopril-hydrochlorothiazide) .... Take 2 tablet by mouth once a day 2)  Norvasc 10 Mg Tabs (Amlodipine besylate) .Marland Kitchen.. 1 tab by mouth daily for blood pressure. 3)  Simvastatin 40 Mg Tabs (Simvastatin) .Marland Kitchen.. 1 tab by mouth at bedtime hold 4)  Hydrocortisone 2.5 % Crea (Hydrocortisone) .... Apply to legs two times a day.  dispense 30 gram. 5)  Potassium Chloride Crys Cr 20 Meq Cr-tabs (Potassium chloride crys cr) .... One tab three times a day for 2 days then one daily as needed 6)  Famotidine 40 Mg Tabs (Famotidine) .... One daily 7)  Aspirin 81 Mg Tbec (Aspirin) .... Take one tablet by mouth daily  Laboratory Results   Urine Tests  Date/Time Received: April 13, 2010 10:52 AM  Date/Time Reported: April 13, 2010 11:09 AM   Routine Urinalysis   Color: yellow Appearance: Clear Glucose: negative   (Normal Range: Negative) Bilirubin: negative   (Normal Range: Negative) Ketone: negative   (Normal Range: Negative) Spec. Gravity: 1.015   (Normal Range: 1.003-1.035) Blood: trace-intact   (Normal Range: Negative) pH: 7.0   (Normal Range: 5.0-8.0) Protein:  negative   (Normal Range: Negative) Urobilinogen: 0.2   (Normal Range: 0-1) Nitrite: negative   (Normal Range: Negative) Leukocyte Esterace: negative   (Normal Range: Negative)  Urine Microscopic WBC/HPF: rare RBC/HPF: 0-2 Bacteria/HPF: 1+ Mucous/HPF: 2+ Epithelial/HPF: rare Other: 2+ amorphous    Comments: ...............test performed by......Marland KitchenBonnie A. [place], MLS (ASCP)cm      Prevention & Chronic Care Immunizations   Influenza vaccine: Not documented   Influenza vaccine deferral: Refused  (06/18/2009)    Tetanus booster: 07/03/2005: Done.     Pneumococcal vaccine: Not documented  Colorectal Screening   Hemoccult: Not documented   Hemoccult action/deferral: Ordered  (06/18/2009)    Colonoscopy: Not documented   Colonoscopy action/deferral: GI Referral  (08/04/2009)  Other Screening   Pap smear: NEGATIVE FOR INTRAEPITHELIAL LESIONS OR MALIGNANCY.  (06/18/2009)   Pap smear action/deferral: Deferred-3 yr interval  (06/23/2009)   Pap smear due: 06/23/2012    Mammogram: ASSESSMENT: Negative - BI-RADS 1^MM DIGITAL SCREENING  (08/11/2009)   Mammogram action/deferral: Ordered  (08/04/2009)   Mammogram due: 08/11/2010   Smoking status: current  (04/13/2010)   Smoking cessation counseling: YES  (03/14/2010)  Lipids   Total Cholesterol: 210  (03/23/2009)   LDL: 118  (03/23/2009)   LDL Direct: Not documented   HDL: 77  (03/23/2009)   Triglycerides: 74  (03/23/2009)  Hypertension   Last Blood Pressure: 142 / 64  (04/13/2010)   Serum creatinine: 0.87  (03/04/2010)   BMP action: Ordered   Serum potassium 3.8  (03/04/2010)    Hypertension flowsheet reviewed?: Yes   Progress toward BP goal: Improved  Self-Management Support :   Personal Goals (by the next clinic visit) :      Personal blood pressure goal: 140/90  (08/04/2009)   Hypertension self-management support: Written self-care plan  (12/13/2009)

## 2010-08-02 NOTE — Assessment & Plan Note (Signed)
Summary: f/u visit/bmc   Vital Signs:  Patient profile:   60 year old female Height:      62 inches Weight:      121.38 pounds BMI:     22.28 BSA:     1.55 Temp:     98.6 degrees F Pulse rate:   72 / minute BP sitting:   149 / 68  Vitals Entered By: Delora Fuel (June 03, 2010 9:51 AM) CC: f/u htn Is Patient Diabetic? No Pain Assessment Patient in pain? no        Primary Care Provider:  Xayvier Vallez MD  CC:  f/u htn.  History of Present Illness: 60 y/o F here for f/u of HTN  HYPERTENSION Disease Monitoring Blood pressure range: 140s-150s, she had a period of acute illness, likely gastroenteritis, leading to hypokalemia and acute renal failure.  She has since improved.  Previous Bmet showed that renal function returned to wnl.   Medications:  Lisinopril 20 -HCTZ 12.5, Norvasc 10 Compliance: yes Lightheadedness: no  Edema:no  Chest pain: no  Dyspnea:no Prevention Exercise: no routine exercises, but pt is very active; she takes care of twin grandchildren and goes dancing. Salt restriction: not really  Anemia: Menopause in 1997 No abd vaginal bleeding Was never told that she is anemic But feels tired sometimes, but no dyspnea on exertion, lightheadedness, syncope, easy bruising    Habits & Providers  Alcohol-Tobacco-Diet     Alcohol drinks/day: <1     Alcohol Counseling: not indicated; patient does not drink     Tobacco Status: current     Tobacco Counseling: to quit use of tobacco products     Cigarette Packs/Day: 1.0     Year Started: 1968  Current Medications (verified): 1)  Lisinopril-Hydrochlorothiazide 20-12.5 Mg Tabs (Lisinopril-Hydrochlorothiazide) .... Take 2 Tablet By Mouth Once A Day 2)  Norvasc 10 Mg Tabs (Amlodipine Besylate) .Marland Kitchen.. 1 Tab By Mouth Daily For Blood Pressure. 3)  Simvastatin 40 Mg Tabs (Simvastatin) .Marland Kitchen.. 1 Tab By Mouth At Bedtime Hold 4)  Hydrocortisone 2.5 % Crea (Hydrocortisone) .... Apply To Legs Two Times A Day.  Dispense 30  Gram. 5)  Potassium Chloride Crys Cr 20 Meq Cr-Tabs (Potassium Chloride Crys Cr) .... One Tab Three Times A Day For 2 Days Then One Daily As Needed 6)  Famotidine 40 Mg Tabs (Famotidine) .... One Daily 7)  Aspirin 81 Mg Tbec (Aspirin) .... Take One Tablet By Mouth Daily  Allergies (verified): 1)  ! 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 The EF 60% to 65%. Mild mitral regurgitation.  Family History: Last updated: 03/08/2010 Family History: diabetes, hypertension, stroke  father - deceased at 21 from CVA mother - deceased (scleroderma, DM, RA, HTN) sister -deceased w/ DM, polysubstance abuse Brother with unknown heart disease No Breast Cancer No GYN cancer No colon cancer  Social History: Last updated: 03/04/2010  Smokes 3-4 cigs/day (14yrs), occ EtOH, lives with husband Has 3 children, 17 grand children  Risk Factors: Alcohol Use: <1 (06/03/2010) Exercise: yes (09/22/2009)  Risk Factors: Smoking Status: current (06/03/2010) Packs/Day: 1.0 (06/03/2010)  Review of Systems  per hpi   Physical Exam  General:  Well-developed,well-nourished,in no acute distress; alert,appropriate and cooperative throughout examination. Vitals reviewed.  Lungs:  Normal respiratory effort, chest expands symmetrically. Lungs are clear to auscultation, no crackles or wheezes. Heart:  Normal rate and regular rhythm. S1 and S2 normal without gallop, + 2/ systolic murmur, click, rub or other extra sounds.  No bruit Abdomen:  Bowel sounds  positive,abdomen soft and non-tender without masses, organomegaly or hernias noted. Pulses:  +2 bilaterally  Extremities:  No clubbing, cyanosis, edema, or deformity noted with normal full range of motion of all joints.   Neurologic:  alert & oriented X3.     Impression & Recommendations:  Problem # 1:  HYPERTENSION, BENIGN SYSTEMIC (ICD-401.1) BP not at goal of <140/90.  Will add BB, low dose carvedilol 3.125mg  two times a day.  Will Bmet today as pt has history of hypokalemia and arf in the past few months.  If renal function is worse again, will decrease Lisinopril-HCTZ combo to one tab daily vs the 2 she is taking now.    Her updated medication list for this problem includes:    Lisinopril-hydrochlorothiazide 20-12.5 Mg Tabs (Lisinopril-hydrochlorothiazide) .Marland Kitchen... Take 2 tablet by mouth once a day    Norvasc 10 Mg Tabs (Amlodipine besylate) .Marland Kitchen... 1 tab by mouth daily for blood pressure.    Carvedilol 3.125 Mg Tabs (Carvedilol) .Marland Kitchen... 1 tab by mouth two times a day for blood pressure  Orders: CBC-[facility] (16109) Basic Met-[facility] ([id number]) [facility]- Est Level  3 (91478)  Problem # 2:  ANEMIA, MILD (ICD-285.9) Assessment: New Previous Hb showed mild anemia with Hb 12.5 in post menopausal woman, with MCV in the 90s.  Will recheck CBC and Fe panel.    Orders: CBC-[facility] (29562) Ferritin-[facility] [phone]) Iron -[facility] [phone]) Iron Binding Cap (TIBC)-[facility] ([address]) [facility]- Est Level  3 (53664)  Complete Medication List: 1)  Lisinopril-hydrochlorothiazide 20-12.5 Mg Tabs (Lisinopril-hydrochlorothiazide) .... Take 2 tablet by mouth once a day 2)  Norvasc 10 Mg Tabs (Amlodipine besylate) .Marland Kitchen.. 1 tab by mouth daily for blood pressure. 3)  Simvastatin 40 Mg Tabs (Simvastatin) .Marland Kitchen.. 1 tab by mouth at bedtime hold 4)  Hydrocortisone 2.5 % Crea (Hydrocortisone) .... Apply to legs two times a day.  dispense 30 gram. 5)  Aspirin 81 Mg Tbec (Aspirin) .... Take one tablet by mouth daily 6)  Carvedilol  3.125 Mg Tabs (Carvedilol) .Marland Kitchen.. 1 tab by mouth two times a day for blood pressure  Other Orders: Flu Vaccine 16yrs + (40347) Admin 1st Vaccine (42595)  Patient Instructions: 1)  Please schedule a follow-up appointment in 1 month for blood pressure and anemia. 2)  New medicine for blood pressure: Carvedilol (Coreg) 3.125mg  two times a day. 3)  Call me if you start to feel sluggish and tired and can't be as active (dancing) like before.    Prescriptions: CARVEDILOL 3.125 MG TABS (CARVEDILOL) 1 tab by mouth two times a day for blood pressure  #60 x 3   Entered and Authorized by:   Angeline Slim MD   Signed by:   Angeline Slim MD on 06/03/2010   Method used:   Electronically to        [facility] [phone]* (retail)       [address]       [place], [place]  [address]       Ph: [phone]       Fax: [phone]   RxID:   [id number]    Orders Added:  1)  CBC-[facility] [85027] 2)  Basic Met-[facility] [21308-65784] 3)  Ferritin-[facility] [82728-23350] 4)  Iron -[facility] [[id number]] 5)  Iron Binding Cap (TIBC)-[facility] [[address]] 6)  [facility]- Est Level  3 [99213] 7)  Flu Vaccine 43yrs + [90658] 8)  Admin 1st Vaccine [25366]   Immunizations Administered:  Influenza Vaccine # 1:    Vaccine Type: Fluvax 3+    Site: right deltoid    Mfr: GlaxoSmithKline    Dose: 0.5 ml    Route: IM    Given by: Garen Grams LPN    Exp. Date: 12/28/2010    Lot #: [id number]    VIS given: 01/25/10 version given June 03, 2010.  Flu Vaccine Consent Questions:    Do you have a history of severe allergic reactions to this vaccine? no    Any prior history of allergic reactions to egg and/or gelatin? no    Do you have a sensitivity to the preservative Thimersol? no    Do you have a past history of Guillan-Barre Syndrome? no    Do you currently have an acute febrile illness? no    Have you ever had a severe reaction to latex? no    Vaccine information given and explained to patient? yes    Are you currently pregnant?  no   Immunizations Administered:  Influenza Vaccine # 1:    Vaccine Type: Fluvax 3+    Site: right deltoid    Mfr: GlaxoSmithKline    Dose: 0.5 ml    Route: IM    Given by: Garen Grams LPN    Exp. Date: 12/28/2010    Lot #: [id number]    VIS given: 01/25/10 version given June 03, 2010.    Prevention & Chronic Care Immunizations   Influenza vaccine: Fluvax 3+  (06/03/2010)   Influenza vaccine deferral: Refused  (06/18/2009)    Tetanus booster: 07/03/2005: Done.    Pneumococcal vaccine: Not documented    H. zoster vaccine: Not documented  Colorectal Screening   Hemoccult: Not documented   Hemoccult action/deferral: Ordered  (06/18/2009)    Colonoscopy: Not documented   Colonoscopy action/deferral: GI Referral  (08/04/2009)  Other Screening   Pap smear: NEGATIVE FOR INTRAEPITHELIAL LESIONS OR MALIGNANCY.  (06/18/2009)   Pap smear action/deferral: Deferred-3 yr interval  (06/23/2009)   Pap smear due: 06/23/2012    Mammogram: ASSESSMENT: Negative - BI-RADS 1^MM DIGITAL SCREENING  (08/11/2009)   Mammogram action/deferral: Ordered  (08/04/2009)   Mammogram due: 08/11/2010    DXA bone density scan: Not documented   Smoking status: current  (06/03/2010)   Smoking cessation counseling: YES  (03/14/2010)  Lipids   Total Cholesterol: 210  (03/23/2009)   LDL: 118  (03/23/2009)   LDL Direct: Not documented   HDL: 77  (03/23/2009)   Triglycerides: 74  (03/23/2009)  Hypertension   Last Blood Pressure: 149 / 68  (06/03/2010)   Serum creatinine: 0.87  (03/04/2010)   BMP action: Ordered   Serum potassium 3.8  (03/04/2010)    Hypertension flowsheet reviewed?: Yes   Progress toward BP goal: Unchanged  Self-Management Support :   Personal Goals (by the next clinic visit) :      Personal blood pressure goal: 140/90  (08/04/2009)   Hypertension self-management support: Written self-care plan  (12/13/2009)

## 2010-08-02 NOTE — Miscellaneous (Signed)
Summary: Critically low K/replacement  Medications Added POTASSIUM CHLORIDE CRYS CR 20 MEQ CR-TABS (POTASSIUM CHLORIDE CRYS CR) one tab three times a day for 2 days then one daily FAMOTIDINE 40 MG TABS (FAMOTIDINE) one daily       Clinical Lists Changes Critically low potassium at 2.7; called and spoke with Mrs. Tasia Catchings, she will replace with KCL 20 mg three times a day for 2 days then daily.  She is currently holding all of her by mouth meds until seen by Dr. Janalyn Harder later this week.  Will add H2 blocker for susupected gastritis.  Pancreatic enzymes normal but in sub acute phase of condition.  Luretha Murphy NP  February 22, 2010 8:55 AM  Medications: Added new medication of POTASSIUM CHLORIDE CRYS CR 20 MEQ CR-TABS (POTASSIUM CHLORIDE CRYS CR) one tab three times a day for 2 days then one daily - Signed Added new medication of FAMOTIDINE 40 MG TABS (FAMOTIDINE) one daily - Signed Rx of POTASSIUM CHLORIDE CRYS CR 20 MEQ CR-TABS (POTASSIUM CHLORIDE CRYS CR) one tab three times a day for 2 days then one daily;  #30 x 0;  Signed;  Entered by: Luretha Murphy NP;  Authorized by: Luretha Murphy NP;  Method used: Electronically to [facility] [phone]*, [address] [place], [place]  [address], Ph: [phone], Fax: [phone] Rx of FAMOTIDINE 40 MG TABS (FAMOTIDINE) one daily;  #30 x 1;  Signed;  Entered by: Luretha Murphy NP;  Authorized by: Luretha Murphy NP;  Method used: Electronically to [facility] [phone]*, [address], [place], [place]  [address], Ph: [phone], Fax: [phone]    Prescriptions: FAMOTIDINE 40 MG TABS (FAMOTIDINE) one daily  #30 x 1   Entered and Authorized by:   Luretha Murphy NP   Signed by:   Luretha Murphy NP on 02/22/2010   Method used:   Electronically to        [facility] [phone]* (retail)       [address]       [place], [place]  [address]       Ph: [phone]       Fax: [phone]   RxID:   [id number] POTASSIUM CHLORIDE CRYS CR 20 MEQ CR-TABS  (POTASSIUM CHLORIDE CRYS CR) one tab three times a day for 2 days then one daily  #30 x 0   Entered and Authorized by:   Luretha Murphy NP   Signed by:   Luretha Murphy NP on 02/22/2010   Method used:   Electronically to        [facility] [phone]* (retail)       [address]       [place], [place]  [address]       Ph: [phone]       Fax: [phone]   RxID:   [id number]

## 2010-08-02 NOTE — Progress Notes (Signed)
  Phone Note Outgoing Call   Call placed by: Jimmy Footman, CMA,  April 13, 2010 12:15 PM Call placed to: Patient Summary of Call: LVM for patient to call back. [facility] ultrasound and CT are set up for friday 04/15/2010 at 11am pt to arrive @ 10:30am. Patient to pick up contrast tonight or tomorrow from [facility]  Follow-up for Phone Call        called pt, notified of appt this friday at [facility] and to pick up contrast dye tomorrow. Follow-up by: Tessie Fass CMA,  April 13, 2010 4:24 PM

## 2010-08-02 NOTE — Assessment & Plan Note (Signed)
Summary: stomach pain/cough,df   Vital Signs:  Patient profile:   60 year old female Weight:      112.3 pounds Temp:     99 degrees F oral Pulse rate:   80 / minute BP sitting:   103 / 62  (right arm)  Vitals Entered By: Arlyss Repress CMA, (February 18, 2010 2:21 PM)  CC: vomitting and abdominal pain on sunday. chills on tuesday. some dysuria and increased urination x tuesday. weakness of legs x 4 days.  Is Patient Diabetic? No Pain Assessment Patient in pain? yes     Location: abdomen Intensity: 9 Onset of pain  x4d   Primary Care Provider:  Saban Heinlen MD  CC:  vomitting and abdominal pain on sunday. chills on tuesday. some dysuria and increased urination x tuesday. weakness of legs x 4 days. Marland Kitchen  History of Present Illness: 60 y/o F with HTN presents for feeling bad x 5 days.  She and her husband were out on Sunday and she felt fine.  Starting Monday afternoon she began to have vomiting x 3-4 times.  She denies abd pain or diarrhea.  She had another episode of vomiting on Tues and one again this morning.   + Fever/Chill +Myalgias (achy arms, back, legs) +Dec appetite: only eating a little bit of noodles, fruits, trying to drink water +Feeling weak -Diarrhea +lightheadedness +generally just feeling bad all over Trying to drink water, but does not like the taste of it.  She is usually able to keep fluids/solids down. She also has had a cough for almost 1 month.      Habits & Providers  Alcohol-Tobacco-Diet     Tobacco Status: current     Tobacco Counseling: to quit use of tobacco products  Current Medications (verified): 1)  Lisinopril-Hydrochlorothiazide 20-12.5 Mg Tabs (Lisinopril-Hydrochlorothiazide) .... Take 2 Tablet By Mouth Once A Day 2)  Norvasc 10 Mg Tabs (Amlodipine Besylate) .Marland Kitchen.. 1 Tab By Mouth Daily For Blood Pressure 3)  Simvastatin 40 Mg Tabs (Simvastatin) .Marland Kitchen.. 1 Tab By Mouth At Bedtime 4)  Aspirin 81 Mg Tabs (Aspirin) .... By Mouth Daily 5)  Hydrocortisone  2.5 % Crea (Hydrocortisone) .... Apply To Legs Two Times A Day.  Dispense 30 Gram.  Allergies (verified): 1)  ! Codeine  Review of Systems General:  Complains of chills, fatigue, fever, loss of appetite, malaise, and weakness. CV:  Denies bluish discoloration of lips or nails, chest pain or discomfort, difficulty breathing at night, and fainting. Resp:  Complains of cough; denies coughing up blood, morning headaches, and wheezing. GI:  Complains of loss of appetite and vomiting; denies bloody stools, change in bowel habits, constipation, dark tarry stools, diarrhea, and nausea. MS:  Denies muscle aches.  Physical Exam  General:  Well-developed,well-nourished,in no acute distress; alert,appropriate and cooperative throughout examination. vitals reviewed.   Head:  normocephalic and atraumatic.   Mouth:  dry mucous membrane Neck:  No deformities, masses, or tenderness noted. Lungs:  Normal respiratory effort, chest expands symmetrically. Lungs are clear to auscultation, no crackles or wheezes. Heart:  Normal rate and regular rhythm. S1 and S2 normal without gallop, murmur, click, rub or other extra sounds. Abdomen:  Bowel sounds positive,abdomen soft and non-tender without masses, organomegaly or hernias noted. Pulses:  R radial normal, R carotid normal, L radial normal, and L carotid normal.   Extremities:  No clubbing, cyanosis, edema, or deformity noted with normal full range of motion of all joints.   Neurologic:  alert & oriented X3  and cranial nerves II-XII intact.   Skin:  Intact without suspicious lesions or rashes Cervical Nodes:  No lymphadenopathy noted   Impression & Recommendations:  Problem # 1:  VOMITING (ICD-787.03) Symptoms of intermitten vomiting and myalgias, fever may be 2/2 viral process.  No diarrhea.  Pt is hypotensive today.  Standing BP almost orthostatic.  Likely symptoms now 2/2 dehydration.  I've asked pt to stop taking her antihypertensive meds (norvasc, hctz,  lisinopril).  I've asked pt to be aggressive with fluid rehydration.  Pt drank in the clinic and able to keep it down.  Pt understands that if she cannot keep fluids down she will need IVF hydration.  She will call [facility] or go to ER.  She will come back on Monday for recheck.   Will check CBC and Bmet today. Orders: Basic Met-[facility] [id number]) CBC-[facility] (14782) [facility]- Est Level  3 (95621)  Problem # 2:  MYALGIA (ICD-729.1) see above Orders: Basic Met-[facility] ([id number]) CBC-[facility] (62952) [facility]- Est Level  3 (84132)  Her updated medication list for this problem includes:    Aspirin 81 Mg Tabs (Aspirin) ..... By mouth daily  Complete Medication List: 1)  Lisinopril-hydrochlorothiazide 20-12.5 Mg Tabs (Lisinopril-hydrochlorothiazide) .... Take 2 tablet by mouth once a day 2)  Norvasc 10 Mg Tabs (Amlodipine besylate) .Marland Kitchen.. 1 tab by mouth daily for blood pressure 3)  Simvastatin 40 Mg Tabs (Simvastatin) .Marland Kitchen.. 1 tab by mouth at bedtime 4)  Aspirin 81 Mg Tabs (Aspirin) .... By mouth daily 5)  Hydrocortisone 2.5 % Crea (Hydrocortisone) .... Apply to legs two times a day.  dispense 30 gram.  Other Orders: Urinalysis-[facility] (00000)  Patient Instructions: 1)  Please schedule a follow-up appointment on Monday for f/u of dehydration.  2)  Stop your blood pressure medicines (Norvasc, HCTZ, Lisinopril). 3)  Get plenty of rest, drink lots of clear liquids, and use Tylenol or Ibuprofen for fever and comfort. Return in 7-10 days if you're not better: sooner if you'er feeling worse.  4)  Take 650 - 1000 mg of tylenol every 4-6 hours as needed for relief of pain or comfort of fever. Avoid taking more than 4000 mg in a 24 hour period( can cause liver damage in higher doses).  5)  Take 400-600 mg of Ibuprofen (Advil, Motrin) with food every 4-6 hours as needed  for relief of pain or comfort of fever.  6)  Oral rehydration solution: Drink 1/2 ounce every 15 minutes. If tolerated after 1 hour, drink 1 ounce every 15  minutes. As you  can tolerate, keep adding 1/2 ounce every 15 minutes, up to a total or 2-4 ounces. Contact the office if unable to tolerate oral solution, if you keep vomiting, or you continue to have signs of dehydration.  Laboratory Results   Urine Tests  Date/Time Received: February 18, 2010 2:28 PM  Date/Time Reported: February 18, 2010 3:07 PM   Routine Urinalysis   Color: yellow Appearance: Clear Glucose: negative   (Normal Range: Negative) Bilirubin: moderate-icto negative   (Normal Range: Negative) Ketone: negative   (Normal Range: Negative) Spec. Gravity: 1.020   (Normal Range: 1.003-1.035) Blood: moderate   (Normal Range: Negative) pH: 5.5   (Normal Range: 5.0-8.0) Protein: 100   (Normal Range: Negative) Urobilinogen: 2.0   (Normal Range: 0-1) Nitrite: negative   (Normal Range: Negative) Leukocyte Esterace: negative   (Normal Range: Negative)  Urine Microscopic WBC/HPF: 1-5 RBC/HPF: 5-20 Bacteria/HPF: 3+ Epithelial/HPF: 10-20 Casts/LPF: occ hyaline Other: mod amorphous    Comments: ...........test  performed by...........Marland KitchenTerese Door, CMA

## 2010-08-02 NOTE — Assessment & Plan Note (Signed)
Summary: fu/kh   Vital Signs:  Patient profile:   60 year old female Height:      62 inches Weight:      115 pounds BMI:     21.11 Temp:     98.2 degrees F oral Pulse rate:   75 / minute BP sitting:   137 / 62  (left arm)  Vitals Entered By: Tessie Fass CMA (December 13, 2009 4:20 PM) CC: F/U Is Patient Diabetic? No Pain Assessment Patient in pain? no        Primary Care Provider:  Ida Milbrath MD  CC:  F/U.  History of Present Illness: 60 y/o F here for BP f/u  HYPERTENSION Disease Monitoring Blood pressure range: SBP 130s-140s when pt takes meds, have been in 160s, 190s Medications: lisinopril 20/hctz 12.5, norvasc 10 Compliance: yes  Lightheadedness: no  Edema:no   Chest pain: no  Dyspnea:no Prevention Exercise: yes Salt restriction: sometimes    Habits & Providers  Alcohol-Tobacco-Diet     Tobacco Status: current     Tobacco Counseling: to quit use of tobacco products     Cigarette Packs/Day: 0.5  Current Medications (verified): 1)  Lisinopril-Hydrochlorothiazide 20-12.5 Mg Tabs (Lisinopril-Hydrochlorothiazide) .... Take 2 Tablet By Mouth Once A Day 2)  Norvasc 10 Mg Tabs (Amlodipine Besylate) .Marland Kitchen.. 1 Tab By Mouth Daily For Blood Pressure 3)  Simvastatin 40 Mg Tabs (Simvastatin) .Marland Kitchen.. 1 Tab By Mouth At Bedtime 4)  Aspirin 81 Mg Tabs (Aspirin) .... By Mouth Daily 5)  Hydrocortisone 2.5 % Crea (Hydrocortisone) .... Apply To Legs Two Times A Day.  Dispense 30 Gram.  Allergies (verified): 1)  ! 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 Abnormal Stress EKG 12/2005 HTN Graves dz s/p ablation Eczema GERD   Past Surgical History: Last updated: 2009-06-30 -cyst behind left ear removed by general surgery 1992 -Left rotator cuff repair 2003 -Left foot surgery after falling 2000  Family History: Last updated: 06-30-2009 father - deceased  at 62 from CVA mother - deceased (scleroderma, DM, RA, HTN) sister -deceased w/ DM, polysubstance abuse No Breast Cancer No GYN cancer No colon cancer  Social History: Last updated: 06/30/2009 [employer]; Smokes 3-4 cigs/day (67yrs), occ EtOH, lives with husband Has 3 children, 17 grand children  Code Status:  Full Code  Risk Factors: Alcohol Use: <1 (Jun 30, 2009) Exercise: yes (09/22/2009)  Risk Factors: Smoking Status: current (12/13/2009) Packs/Day: 0.5 (12/13/2009)  Review of Systems       per hpi  Physical Exam  General:  Well-developed,well-nourished,in no acute distress; alert,appropriate and cooperative throughout examination Lungs:  Normal respiratory effort, chest expands symmetrically. Lungs are clear to auscultation, no crackles or wheezes. Heart:  Normal rate and regular rhythm. S1 and S2 normal without gallop, murmur, click, rub or other extra sounds. Pulses:  R radial normal and L radial normal.   Extremities:  No clubbing, cyanosis, edema, or deformity noted with normal full range of motion of all joints.     Impression & Recommendations:  Problem # 1:  HYPERTENSION, BENIGN SYSTEMIC (ICD-401.1) Assessment Improved BP at goal.  Continue current meds.  Will check Bmet for electrolytes and Cr. Her updated medication list for this problem includes:    Lisinopril-hydrochlorothiazide 20-12.5 Mg Tabs (Lisinopril-hydrochlorothiazide) .Marland Kitchen... Take 2 tablet by mouth once a day    Norvasc 10 Mg Tabs (Amlodipine besylate) .Marland Kitchen... 1 tab by mouth daily for blood  pressure  Orders: Basic Met-[facility] ([id number]) [facility]- Est Level  3 (37106)  Problem # 2:  TOBACCO USER (ICD-305.1) Assessment: Comment Only Advised to quit.    Problem # 3:  SPECIAL SCREENING FOR MALIGNANT NEOPLASMS COLON (ICD-V76.51) Assessment: Comment Only Pt has Bar Spec.  Awaiting volunteer GI for scoping.    Complete Medication List: 1)  Lisinopril-hydrochlorothiazide 20-12.5 Mg Tabs  (Lisinopril-hydrochlorothiazide) .... Take 2 tablet by mouth once a day 2)  Norvasc 10 Mg Tabs (Amlodipine besylate) .Marland Kitchen.. 1 tab by mouth daily for blood pressure 3)  Simvastatin 40 Mg Tabs (Simvastatin) .Marland Kitchen.. 1 tab by mouth at bedtime 4)  Aspirin 81 Mg Tabs (Aspirin) .... By mouth daily 5)  Hydrocortisone 2.5 % Crea (Hydrocortisone) .... Apply to legs two times a day.  dispense 30 gram.  Patient Instructions: 1)  Please schedule a follow-up appointment in 3 months dor blood pressure.  2)  We will check your cholesterol next time. 3)  Tobacco is very bad for your health and your loved ones ! You should stop smoking !  4)  Stop smoking tips: Choose a quit date. Cut down before the quit date. Decide what you will do as a substitute when you feel the urge to smoke(gum, toothpick, exercise).  Prescriptions: HYDROCORTISONE 2.5 % CREA (HYDROCORTISONE) apply to legs two times a day.  Dispense 30 gram.  #1 x 3   Entered and Authorized by:   Angeline Slim MD   Signed by:   Angeline Slim MD on 12/13/2009   Method used:   Electronically to        [facility] [phone]* (retail)       [address]       [place], [place]  [address]       Ph: [phone]       Fax: [phone]   RxID:   [id number]     Prevention & Chronic Care Immunizations   Influenza vaccine: Not documented   Influenza vaccine deferral: Refused  (06/18/2009)    Tetanus booster: 07/03/2005: Done.    Pneumococcal vaccine: Not documented  Colorectal Screening   Hemoccult: Not documented   Hemoccult action/deferral: Ordered  (06/18/2009)    Colonoscopy: Not documented   Colonoscopy action/deferral: GI Referral  (08/04/2009)  Other Screening   Pap smear: NEGATIVE FOR INTRAEPITHELIAL LESIONS OR MALIGNANCY.  (06/18/2009)   Pap smear action/deferral: Deferred-3 yr interval  (06/23/2009)   Pap smear due: 06/23/2012    Mammogram: ASSESSMENT: Negative - BI-RADS 1^MM DIGITAL SCREENING  (08/11/2009)   Mammogram action/deferral:  Ordered  (08/04/2009)   Mammogram due: 08/11/2010   Smoking status: current  (12/13/2009)   Smoking cessation counseling: YES  (12/13/2009)  Lipids   Total Cholesterol: 210  (03/23/2009)   LDL: 118  (03/23/2009)   LDL Direct: Not documented   HDL: 77  (03/23/2009)   Triglycerides: 74  (03/23/2009)  Hypertension   Last Blood Pressure: 137 / 62  (12/13/2009)   Serum creatinine: 1.31  (03/23/2009)   BMP action: Ordered   Serum potassium 4.5  (03/23/2009)    Hypertension flowsheet reviewed?: Yes   Progress toward BP goal: At goal  Self-Management Support :   Personal Goals (by the next clinic visit) :      Personal blood pressure goal: 140/90  (08/04/2009)   Hypertension self-management support: Written self-care plan  (12/13/2009)   Hypertension self-care plan printed.

## 2010-08-02 NOTE — Assessment & Plan Note (Signed)
Summary: f/up,tcb   Vital Signs:  Patient profile:   60 year old female Height:      62 inches Weight:      115 pounds BMI:     21.11 Temp:     98.5 degrees F oral Pulse rate:   52 / minute BP sitting:   156 / 66  (left arm) Cuff size:   regular  Vitals Entered By: Loralee Pacas CMA (March 14, 2010 2:32 PM) CC: F/U HTN Is Patient Diabetic? No Pain Assessment Patient in pain? no        Primary Care Shuan Statzer:  Cat Ta MD  CC:  F/U HTN.  History of Present Illness: 60 y/o F here  HYPERTENSION Disease Monitoring Blood pressure range: 140s-150s, today 160s Medications: Norvasc 10 (not taking since illness) , Lisinopril 40mg , HCTZ 25 Compliance: yes  Lightheadedness: no (this was present since illness, but none now)  Edema: no  Chest pain: no  Dyspnea:no Pt feels overall that she is back to normal.  She is no longer lightheaded when she stands up.   Prevention Exercise: yes  Salt restriction: yes  Pt was referred to Cardiology last week after she was found to be in sinus bradycardia.  Dr Jens Som saw her and performed 2D echo, which was essentially normal.  She has been released from his office.     Habits & Providers  Alcohol-Tobacco-Diet     Tobacco Status: current     Tobacco Counseling: to quit use of tobacco products     Cigarette Packs/Day: 1.0  Current Medications (verified): 1)  Lisinopril-Hydrochlorothiazide 20-12.5 Mg Tabs (Lisinopril-Hydrochlorothiazide) .... Take 2 Tablet By Mouth Once A Day 2)  Norvasc 10 Mg Tabs (Amlodipine Besylate) .Marland Kitchen.. 1 Tab By Mouth Daily For Blood Pressure. 3)  Simvastatin 40 Mg Tabs (Simvastatin) .Marland Kitchen.. 1 Tab By Mouth At Bedtime Hold 4)  Hydrocortisone 2.5 % Crea (Hydrocortisone) .... Apply To Legs Two Times A Day.  Dispense 30 Gram. 5)  Potassium Chloride Crys Cr 20 Meq Cr-Tabs (Potassium Chloride Crys Cr) .... One Tab Three Times A Day For 2 Days Then One Daily As Needed 6)  Famotidine 40 Mg Tabs (Famotidine) .... One Daily 7)   Aspirin 81 Mg Tbec (Aspirin) .... Take One Tablet By Mouth Daily  Allergies (verified): 1)  ! Codeine  Past History:  Family History: Last updated: 03/08/2010 Family History: diabetes, hypertension, stroke  father - deceased at 91 from CVA mother - deceased (scleroderma, DM, RA, HTN) sister -deceased w/ DM, polysubstance abuse Brother with unknown heart disease No Breast Cancer No GYN cancer No colon cancer  Social History: Last updated: 03/04/2010  Smokes 3-4 cigs/day (58yrs), occ EtOH, lives with husband Has 3 children, 17 grand children  Risk Factors: Alcohol Use: <1 (06/18/2009) Exercise: yes (09/22/2009)  Risk Factors: Smoking Status: current (03/14/2010) Packs/Day: 1.0 (03/14/2010)  Past Medical History: Hypertension GERD Cancer removed from face 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 The EF 60% to 65%. Mild mitral regurgitation.  Social History: Packs/Day:  1.0  Review of Systems       per hpi   Physical Exam  General:  Well-developed,well-nourished,in no acute distress; alert,appropriate and cooperative throughout examination. vitals reviewed.   Neck:  No  deformities, masses, or tenderness noted. Lungs:  Normal respiratory effort, chest expands symmetrically. Lungs are clear to auscultation, no crackles or wheezes. Heart:  Normal rate and regular rhythm. S1 and S2 normal without gallop, murmur, click, rub or other extra sounds. Abdomen:  Bowel sounds positive,abdomen soft and non-tender without masses, organomegaly or hernias noted. Pulses:  R and L carotid,radial,,dorsalis pedis pulses are full and equal bilaterally Extremities:  No clubbing,  cyanosis, edema, or deformity noted with normal full range of motion of all joints.   Neurologic:  alert & oriented X3.     Impression & Recommendations:  Problem # 1:  HYPERTENSION, BENIGN SYSTEMIC (ICD-401.1) Assessment Unchanged Will resume all previous meds that we were holding when pt had viral symptoms and found to be hypotensive.  Her BP is elevated today.  She was seen by Dr Jens Som to evaluate bradycardia and was found to be benign.  He is ok for her to resume all her previous meds.  Pt to rtc in 1 month for f/u.  At that time we will check Bmet to check K, as pt is finishing up 30-day of KCl .  Will also get UA at next visit to f/u hematuria that occured when pt had viral symptoms and was hypotensive.   Her updated medication list for this problem includes:    Lisinopril-hydrochlorothiazide 20-12.5 Mg Tabs (Lisinopril-hydrochlorothiazide) .Marland Kitchen... Take 2 tablet by mouth once a day    Norvasc 10 Mg Tabs (Amlodipine besylate) .Marland Kitchen... 1 tab by mouth daily for blood pressure.  Problem # 2:  BRADYCARDIA (ICD-427.89) Assessment: Comment Only Normal Echo by Dr Jens Som 03/14/10.  Will not work up further.  Pt asymptomatic.  Her updated medication list for this problem includes:    Aspirin 81 Mg Tbec (Aspirin) .Marland Kitchen... Take one tablet by mouth daily  Complete Medication List: 1)  Lisinopril-hydrochlorothiazide 20-12.5 Mg Tabs (Lisinopril-hydrochlorothiazide) .... Take 2 tablet by mouth once a day 2)  Norvasc 10 Mg Tabs (Amlodipine besylate) .Marland Kitchen.. 1 tab by mouth daily for blood pressure. 3)  Simvastatin 40 Mg Tabs (Simvastatin) .Marland Kitchen.. 1 tab by mouth at bedtime hold 4)  Hydrocortisone 2.5 % Crea (Hydrocortisone) .... Apply to legs two times a day.  dispense 30 gram. 5)  Potassium Chloride Crys Cr 20 Meq Cr-tabs (Potassium chloride crys cr) .... One tab three times a day for 2 days then one daily as needed 6)  Famotidine 40 Mg Tabs (Famotidine) .... One daily 7)  Aspirin 81 Mg Tbec (Aspirin) ....  Take one tablet by mouth daily  Patient Instructions: 1)  Please schedule a follow-up appointment in 1 month for blood pressure.  I will check labs next time I see you.  2)  Take all your blood pressure medicines like before: Norvasc 10mg  once a day, Lisinopril 20/HCTZ 12.5 two pills once a day. 3)  Take aspirin. 4)  Take cholesterol at night. 5)    6)  Tobacco is very bad for your health and your loved ones ! You should stop smoking !  7)  Stop smoking tips: Choose a quit date. Cut down before the quit date. Decide what you will do as a substitute when you feel the urge to smoke(gum, toothpick, exercise).   Appended Document: f/up,tcb

## 2010-08-02 NOTE — Progress Notes (Signed)
   Phone Note Outgoing Call   Call placed by: Angeline Slim MD,  February 22, 2010 9:34 AM Call placed to: Patient Summary of Call: Left message for pt.  K is critically low.  Luretha Murphy has already called in Rx for Kdur.  Reinterated that it is important to take K.   When current condition is more stable, will need work up for hematuria, hypokalemia.   Initial call taken by: Cat Ta MD,  February 22, 2010 9:35 AM

## 2010-08-04 NOTE — Progress Notes (Signed)
Summary: refill  Phone Note Refill Request Call back at Home Phone [phone] Message from:  Patient  Refills Requested: Medication #1:  SIMVASTATIN 40 MG TABS 1 tab by mouth at bedtime HOLD   Notes: not sure if she is still supposed to be taking this.-if so [facility]  Medication #2:  NORVASC 10 MG TABS 1 tab by mouth daily for blood pressure.  Medication #3:  LISINOPRIL-HYDROCHLOROTHIAZIDE 20-12.5 MG TABS Take 1 tablet by mouth once a day Initial call taken by: De Nurse,  July 18, 2010 9:35 AM  Follow-up for Phone Call        Called pt to dicuss meds.  Will stop Lisinopril for now due to my concerns for ARF in recent history.  Will inc hctz from 12.5 to 25mg  daily.  Cont norvasc 10mg , hydralize 10mg  three times a day, carvedilol 3.125mg  two times a day. Pt to see me when she comes back from her trip.  Follow-up by: Maddex Garlitz MD,  July 18, 2010 1:32 PM    New/Updated Medications: HYDROCHLOROTHIAZIDE 25 MG TABS (HYDROCHLOROTHIAZIDE) 1 tab by mouth daily for blood pressure Prescriptions: HYDROCHLOROTHIAZIDE 25 MG TABS (HYDROCHLOROTHIAZIDE) 1 tab by mouth daily for blood pressure  #30 x 6   Entered and Authorized by:   Angeline Slim MD   Signed by:   Angeline Slim MD on 07/18/2010   Method used:   Electronically to        [facility] [phone]* (retail)       [address]       [place], [place]  [address]       Ph: [phone]       Fax: [phone]   RxID:   [id number] NORVASC 10 MG TABS (AMLODIPINE BESYLATE) 1 tab by mouth daily for blood pressure.  #30 x 6   Entered and Authorized by:   Angeline Slim MD   Signed by:   Angeline Slim MD on 07/18/2010   Method used:   Electronically to        [facility] [phone]* (retail)       [address]       [place], [place]  [address]       Ph: [phone]       Fax: [phone]   RxID:   [id number] SIMVASTATIN 40 MG TABS (SIMVASTATIN) 1 tab by mouth at bedtime HOLD  #30 x 6   Entered and Authorized  by:   Angeline Slim MD   Signed by:   Angeline Slim MD on 07/18/2010   Method used:   Electronically to        [facility] [phone]* (retail)       [address]       [place], [place]  [address]       Ph: [phone] or [phone]       Fax: [phone]   RxID:   [id number]

## 2010-08-04 NOTE — Assessment & Plan Note (Signed)
Summary: f/u htn, renal function   Vital Signs:  Patient profile:   60 year old female Height:      62 inches Weight:      126.5 pounds BMI:     23.22 Temp:     98.2 degrees F oral Pulse rate:   62 / minute BP sitting:   157 / 69  (left arm) Cuff size:   regular  Vitals Entered By: Jimmy Footman, CMA (June 23, 2010 11:21 AM) CC: HTN, renal  Is Patient Diabetic? No   Primary Care Jalah [place]:  Cat Ta MD  CC:  HTN and renal .  History of Present Illness: 60 y/o F is here for f/u of HTN and renal function.  Pt had acute GI event in 01/2010 that lead to ARF and hypotension from dehydration.  She was found to have hematuria with negative workup for renal dysfunction.  Since then we have been monitoring renal function and HTN.  We made adjustment to her antihypertensive regimen 06/08/10 that included decreasing Lisinopril 40-HCTZ 50 to lisinopril 20-HCTZ25 and adding coreg 3.125 bid .    HYPERTENSION Disease Monitoring Blood pressure range: 140s-150s/70s Medications: hctz25-lisinpril 20, Norvasc 10, Coreg 3.125bid Compliance: yes Lightheadedness: no  Edema:no   Chest pain: no    Dyspnea:np Prevention Exercise: yes  Salt restriction:trying Weight: + 5lbs Tobacco use: 1ppd   Habits & Providers  Alcohol-Tobacco-Diet     Alcohol drinks/day: <1     Alcohol Counseling: not indicated; patient does not drink     Tobacco Status: current     Tobacco Counseling: to quit use of tobacco products     Cigarette Packs/Day: 1.0     Year Started: 1967  Current Medications (verified): 1)  Lisinopril-Hydrochlorothiazide 20-12.5 Mg Tabs (Lisinopril-Hydrochlorothiazide) .... Take 1 Tablet By Mouth Once A Day 2)  Norvasc 10 Mg Tabs (Amlodipine Besylate) .Marland Kitchen.. 1 Tab By Mouth Daily For Blood Pressure. 3)  Simvastatin 40 Mg Tabs (Simvastatin) .Marland Kitchen.. 1 Tab By Mouth At Bedtime Hold 4)  Hydrocortisone 2.5 % Crea (Hydrocortisone) .... Apply To Legs Two Times A Day.  Dispense 30 Gram. 5)  Aspirin 81 Mg Tbec  (Aspirin) .... Take One Tablet By Mouth Daily 6)  Carvedilol 3.125 Mg Tabs (Carvedilol) .Marland Kitchen.. 1 Tab By Mouth Two Times A Day For Blood Pressure  Allergies (verified): 1)  ! Codeine  Review of Systems       per hpi   Physical Exam  General:  Well-developed,well-nourished,in no acute distress; alert,appropriate and cooperative throughout examination. Vitals reviewed.  Neurologic:  alert & oriented X3.     Impression & Recommendations:  Problem # 1:  HYPERTENSION, BENIGN SYSTEMIC (ICD-401.1) BP still elevated today 157/69 and not at [facility] of <140/890.  Because of worsening renal function recently, I have decreased lisinopril from 40mg  to 20mg .  As BP is not at goal and pt is on maxed dose of norvasc and HR in 60s (cannot change BB) will add hydralazine 10mg  three times a day.  I will continue to monitor closely and may need to d/c lisionpril altogether if renal function does not return to baseline.    Her updated medication list for this problem includes:    Lisinopril-hydrochlorothiazide 20-12.5 Mg Tabs (Lisinopril-hydrochlorothiazide) .Marland Kitchen... Take 1 tablet by mouth once a day    Norvasc 10 Mg Tabs (Amlodipine besylate) .Marland Kitchen... 1 tab by mouth daily for blood pressure.    Carvedilol 3.125 Mg Tabs (Carvedilol) .Marland Kitchen... 1 tab by mouth two times a day for blood pressure  Hydralazine Hcl 10 Mg Tabs (Hydralazine hcl) .Marland Kitchen... 1 tab by mouth three times a day  Orders: Basic Met-[facility] ([address]109-60454) [facility]- Est Level  3 (09811)  Problem # 2:  RENAL FAILURE, ACUTE (ICD-584.9) Will check Bmet.   Pt's Cr has been fluctuating in recent months.   CT abd/pelvis, Renal U/S, and 24-hr urine were wnl.  Pt denies taking NSAIDs.  Will consider d/c lisinopril if renal function cont to deteriorate.   Complete Medication List: 1)  Lisinopril-hydrochlorothiazide 20-12.5 Mg Tabs (Lisinopril-hydrochlorothiazide) .... Take 1 tablet by mouth once a day 2)  Norvasc 10 Mg Tabs (Amlodipine besylate) .Marland Kitchen.. 1 tab by mouth daily for  blood pressure. 3)  Simvastatin 40 Mg Tabs (Simvastatin) .Marland Kitchen.. 1 tab by mouth at bedtime hold 4)  Hydrocortisone 2.5 % Crea (Hydrocortisone) .... Apply to legs two times a day.  dispense 30 gram. 5)  Aspirin 81 Mg Tbec (Aspirin) .... Take one tablet by mouth daily 6)  Carvedilol 3.125 Mg Tabs (Carvedilol) .Marland Kitchen.. 1 tab by mouth two times a day for blood pressure 7)  Hydralazine Hcl 10 Mg Tabs (Hydralazine hcl) .Marland Kitchen.. 1 tab by mouth three times a day  Other Orders: TSH-[facility] ([id number])  Patient Instructions: 1)  Please schedule a follow-up appointment in 2 weeks for nurse visit for blood pressure and lab for cholesterol. 2)  This has to be fasting, so no food or drink after midnight the night before appointment. 3)   Please schedule a follow-up appointment in 1 month with Dr Janalyn Harder for blood pressure. 4)  Med change: Lisinopril-HCTZ cont one tab per day 5)  New med: Hydralazine 10mg  three times a day   Prescriptions: HYDRALAZINE HCL 10 MG TABS (HYDRALAZINE HCL) 1 tab by mouth three times a day  #90 x 3   Entered and Authorized by:   Angeline Slim MD   Signed by:   Angeline Slim MD on 06/23/2010   Method used:   Electronically to        [facility] [phone] (retail)       [address]       [place], [place]  [address]       Ph: [phone]       Fax: [phone]   RxID:   [id number]    Orders Added: 1)  Basic Met-[facility] [[id number]] 2)  TSH-[facility] [[id number]] 3)  [facility]- Est Level  3 [30160]      Prevention & Chronic Care Immunizations   Influenza vaccine: Fluvax 3+  (06/03/2010)   Influenza vaccine deferral: Refused  (06/18/2009)    Tetanus booster: 07/03/2005: Done.    Pneumococcal vaccine: Not documented    H. zoster vaccine: Not documented  Colorectal Screening   Hemoccult: Not documented   Hemoccult action/deferral: Ordered  (06/18/2009)    Colonoscopy: Not documented   Colonoscopy action/deferral: GI Referral  (08/04/2009)  Other Screening   Pap smear: NEGATIVE FOR  INTRAEPITHELIAL LESIONS OR MALIGNANCY.  (06/18/2009)   Pap smear action/deferral: Deferred-3 yr interval  (06/23/2009)   Pap smear due: 06/23/2012    Mammogram: ASSESSMENT: Negative - BI-RADS 1^MM DIGITAL SCREENING  (08/11/2009)   Mammogram action/deferral: Ordered  (08/04/2009)   Mammogram due: 08/11/2010    DXA bone density scan: Not documented   Smoking status: current  (06/23/2010)   Smoking cessation counseling: YES  (03/14/2010)  Lipids   Total Cholesterol: 210  (03/23/2009)   LDL: 118  (03/23/2009)   LDL Direct: Not documented   HDL: 77  (03/23/2009)   Triglycerides: 74  (  03/23/2009)  Hypertension   Last Blood Pressure: 157 / 69  (06/23/2010)   Serum creatinine: 1.11  (06/10/2010)   BMP action: Ordered   Serum potassium 3.7  (06/10/2010)    Hypertension flowsheet reviewed?: Yes   Progress toward BP goal: Deteriorated  Self-Management Support :   Personal Goals (by the next clinic visit) :      Personal blood pressure goal: 140/90  (08/04/2009)   Hypertension self-management support: Written self-care plan  (12/13/2009)

## 2010-08-24 ENCOUNTER — Other Ambulatory Visit: Payer: Self-pay

## 2010-08-24 ENCOUNTER — Other Ambulatory Visit: Payer: Self-pay | Admitting: Family Medicine

## 2010-08-24 DIAGNOSIS — Z1231 Encounter for screening mammogram for malignant neoplasm of breast: Secondary | ICD-10-CM

## 2010-09-02 ENCOUNTER — Ambulatory Visit (HOSPITAL_COMMUNITY)
Admission: RE | Admit: 2010-09-02 | Discharge: 2010-09-02 | Disposition: A | Discharge status: Home / Self Care | Payer: Self-pay | Source: Ambulatory Visit | Attending: Family Medicine | Admitting: Family Medicine

## 2010-09-02 DIAGNOSIS — Z1231 Encounter for screening mammogram for malignant neoplasm of breast: Secondary | ICD-10-CM | POA: Insufficient documentation

## 2010-09-06 ENCOUNTER — Telehealth: Payer: Self-pay | Admitting: Family Medicine

## 2010-09-06 ENCOUNTER — Encounter: Payer: Self-pay | Admitting: Family Medicine

## 2010-09-06 ENCOUNTER — Ambulatory Visit (INDEPENDENT_AMBULATORY_CARE_PROVIDER_SITE_OTHER): Payer: Self-pay | Admitting: Family Medicine

## 2010-09-06 VITALS — BP 144/62 | HR 56 | Temp 98.5°F | Ht 62.0 in | Wt 124.0 lb

## 2010-09-06 DIAGNOSIS — R319 Hematuria, unspecified: Secondary | ICD-10-CM

## 2010-09-06 DIAGNOSIS — I10 Essential (primary) hypertension: Secondary | ICD-10-CM

## 2010-09-06 LAB — CONVERTED CEMR LAB
CO2: 28 meq/L (ref 19–32)
Calcium: 10.8 mg/dL — ABNORMAL HIGH (ref 8.4–10.5)
Potassium: 3.3 meq/L — ABNORMAL LOW (ref 3.5–5.3)
Sodium: 138 meq/L (ref 135–145)

## 2010-09-06 LAB — BASIC METABOLIC PANEL
BUN: 12 mg/dL (ref 6–23)
CO2: 28 mEq/L (ref 19–32)
Calcium: 10.8 mg/dL — ABNORMAL HIGH (ref 8.4–10.5)
Chloride: 97 mEq/L (ref 96–112)
Potassium: 3.3 mEq/L — ABNORMAL LOW (ref 3.5–5.3)

## 2010-09-06 LAB — POCT URINALYSIS DIPSTICK
Blood, UA: NEGATIVE
Nitrite, UA: NEGATIVE
pH, UA: 7

## 2010-09-06 MED ORDER — HYDROCORTISONE 2.5 % EX CREA: TOPICAL_CREAM | Freq: Two times a day (BID) | CUTANEOUS | Status: DC

## 2010-09-06 NOTE — Assessment & Plan Note (Signed)
Will check UA today as pt has had hematuria in the past during time she had acute renal failure and vomiting.

## 2010-09-06 NOTE — Assessment & Plan Note (Addendum)
Etiology unclear at this time.  It may be that HCTZ is causing hypercalcemia.  Concern for malignant process since pt is a smoker.  I asked pt to stop taking HCTZ last time, but pt was confused and continued to take it.  Will check Bmet today.  Will call pt tomorrow.

## 2010-09-06 NOTE — Assessment & Plan Note (Signed)
BP 144/62 which is almost at goal.  Unfortunately pt has been taking HCTZ (it was stopped due to concerns for hypercalcemia) and not Hydralazine.  Will check Bmet today.  If still hypercalcemic, will stop hctz and start hydralazine.  If Ca level wnl, will cont with hctz and monitor.

## 2010-09-06 NOTE — Progress Notes (Signed)
  Subjective:    Patient ID: Lisa Taylor, female    DOB: 1951-02-28, 60 y.o.   MRN: [id number]  HPI 60 y/o F is here for f/u of HTN and renal function.  Pt had acute GI event in 01/2010 that lead to ARF and hypotension from dehydration.  She was found to have hypercalcemia, hematuria with negative workup for renal dysfunction.  Since then we have been monitoring renal function and HTN.  We made adjustment to her antihypertensive regimen that included stopping, stopping HCTZ, adding Hydralazine 10mg  tid, and adding coreg 3.125 bid.  Unfortunately pt was confused about this and has been taking HCTZ 25, Norvasc 10, Coreg 3.125 mg bid  HYPERTENSION Disease Monitoring Blood pressure range: 140s-150s/70s Medications: HCTAZ 25, Norvasc 10, Coreg 3.125bid Compliance: yes Lightheadedness: no  Edema:no   Chest pain: no    Dyspnea:np Prevention Exercise: yes  Salt restriction:trying Tobacco use: 1ppd  HYPERCALCEMIA Recent history of hypercalcemia with most recent Ca 11.1 on 07/12/10.  Prior to that her Ca was 9.4-10.8.  HEMATURIA Hematuria in Aug - Sept 2011.    Review of Systems Per hpi     Objective:   Physical Exam  General:  Well-developed,well-nourished,in no acute distress; alert,appropriate and cooperative throughout examination. Vitals reviewed.  Lungs:  Normal respiratory effort, chest expands symmetrically. Lungs are clear to auscultation, no crackles or wheezes. Heart:  Normal rate and regular rhythm. S1 and S2 normal without gallop, + 2/ systolic murmur, click, rub or other extra sounds.  No bruit Abdomen:  Bowel sounds positive,abdomen soft and non-tender without masses, organomegaly or hernias noted. Pulses:  +2 bilaterally  Extremities:  No clubbing, cyanosis, edema, or deformity noted with normal full range of motion of all joints.   Neurologic:  alert & oriented X3.        Assessment & Plan:

## 2010-09-06 NOTE — Telephone Encounter (Signed)
Called pt to check on her.  No answer.  Left message that pt should schedule appt with me for this month.  I would like to check renal function and BP.

## 2010-09-06 NOTE — Patient Instructions (Signed)
I will call you tomorrow after I have received your lab result. We will either continue Hydrochlorothiazide for your blood pressure or we may stop this medicine. If we stop this medicine we will add Hydralazine three times per day.

## 2010-09-07 ENCOUNTER — Telehealth: Payer: Self-pay | Admitting: Family Medicine

## 2010-09-07 NOTE — Telephone Encounter (Signed)
Call regarding lab results: 1) Cr better, back to normal 2) Ca still a little high but at baseline 3) UA no hematuria 4) K 3.3 a little low: pt should eat fruits (bananas, oranges, etc) daily 4) F/u in 1 month.

## 2010-11-18 NOTE — Discharge Summary (Signed)
Lisa Taylor, Lisa Taylor                ACCOUNT NO.:  [id number]   MEDICAL RECORD NO.:  [id number]          PATIENT TYPE:  OBV   LOCATION:  6707                         FACILITY:  [facility]   PHYSICIAN:  Nestor Ramp, MD        DATE OF BIRTH:  1951/07/01   DATE OF ADMISSION:  04/21/2008  DATE OF DISCHARGE:  04/23/2008                               DISCHARGE SUMMARY   PRIMARY CARE Younes Degeorge:  Norton Blizzard, MD, [facility].   DISCHARGE DIAGNOSES:  1. Hypertension with hypertensive urgency.  2. Emesis.  3. Graves disease.   DISCHARGE MEDICATIONS:  1. Lisinopril 40 mg p.o. daily.  2. Spironolactone 25 mg p.o. daily.  3. Clonidine 0.1 mg p.o. b.i.d.  4. Aspirin 81 mg p.o. daily.   DISCONTINUED MEDICATIONS:  None   CONSULTANTS:  None.   PROCEDURES:  1. April 21, 2008, CT head without contrast.  Impression:  No acute      findings, intracranial atherosclerotic change.  2. X-ray chest 2-view on April 21, 2008, no active cardiopulmonary      disease.  3. X-ray abdomen on April 21, 2008, no acute intra-abdominal      findings.   LABORATORY DATA:  The patient's labs on admission were as follows:  White blood cells 8.8, hemoglobin 17.8, hematocrit 53.5, and platelets  263.  Sodium 136, potassium 3.6, chloride 93, bicarb 29, BUN 16,  creatinine 0.98, glucose 143, and calcium 10.9.  AST 30, ALT 14, total  bilirubin 1.0, alk phos 87, total protein 9.0, albumin 5.1, and lipase  65.  Point-of-care cardiac enzymes negative x1 and cardiac enzymes  negative x3 six hours apart.  BNP 116.  Urinalysis with specific gravity  1.042, urine glucose 100, small amount of bilirubin, negative ketones,  small amount of blood, more than 300 protein, negative nitrites,  negative leukocyte esterase, hyaline and granular casts, 3-6 white blood  cells, 3-6 red blood cells, many bacteria, and 40,000 colonies multiple  bacterial morphotypes present on urine culture.   On discharge, the  patient's labs were as follows:  Sodium 138, potassium  3.5, chloride 106, CO2 of 24, glucose 133, BUN 10, creatinine 0.82, and  calcium 9.2.   BRIEF HOSPITAL COURSE:  This is a 60 year old female with past medical  history significant for hypertension, who presented in hypertensive  urgency, as well as with emesis.  1. Hypertensive urgency.  The patient was admitted to [facility] Service with telemetry.  The patient's hypertensive      urgency was likely secondary to rebound hypertension from stopping      her clonidine.  The patient initially received hydralazine in the      ED with initial pressures of 246/95 in the ED.  The patient's home      lisinopril and Toprol were held initially given reduction of      pressures down to 140 systolic after receiving hydralazine in the      emergency department.  Head CT was negative for acute intracranial  bleed.  The patient had EKG with sinus bradycardia to 44, no ST- or      T-wave changes, normal intervals.  Cardiac enzymes were negative      x3.  Point-of-care enzymes were negative x2.  The patient's      lisinopril was started back at 40 mg p.o. daily.  The patient's      metoprolol was discontinued.  The patient was also started on      spironolactone 25 mg p.o. daily and clonidine 0.1 mg p.o. b.i.d.      On day 1 of hospitalization, the patient's pressures were 115-150      over 66-70; however, on day 2 of hospitalization, the patient's      pressures ranged from 169-230 over 65-85, and clonidine and      spironolactone were started as above.  The patient was also started      on Norvasc as well at her home dose of 10 mg p.o. daily.  The      patient did not endorse any chest pain, dyspnea, or focal      neurological signs during the episode of elevated blood pressures.  2. Emesis.  The patient initially presented with emesis with labs as      above.  The patient was given Zofran for nausea and rehydrated with       IV fluids.  Emesis had ceased by day 2 of hospitalization.  The      patient reported no emesis up to 24 hours prior to discharge.  The      patient was afebrile during the course of the hospitalization.  The      patient's bout of emesis was thought to be due to a viral process      given her presentation.  3. Graves disease.  The patient's TSH was within normal limits at      2.55.  The patient had reported ablation of thyroid and was not on      any medications, this problem was stable during the course of her      hospitalization.   DISCHARGE INSTRUCTIONS:  The patient was discharged with instructions to  continue her medications as described above.  The patient was also given  instructions to return to care if chest pain, dyspnea, or worsening  neurological signs.  The patient was given instructions for heart-  healthy low-sodium diet.  The patient was given instructions to increase  activity slowly.  The patient will follow up with Dr. Norton Blizzard at  [facility].  The patient will call to make appointment  within 1-2 weeks of discharge.  The patient was discharged to home in  stable and improved condition.      Bobby Rumpf, MD  Electronically Signed      Nestor Ramp, MD  Electronically Signed    KC/MEDQ  D:  06/05/2008  T:  06/06/2008  Job:  [id number]   cc:   Norton Blizzard, M.D.

## 2011-03-31 ENCOUNTER — Telehealth: Payer: Self-pay | Admitting: Emergency Medicine

## 2011-03-31 ENCOUNTER — Other Ambulatory Visit: Payer: Self-pay | Admitting: Family Medicine

## 2011-03-31 DIAGNOSIS — I10 Essential (primary) hypertension: Secondary | ICD-10-CM

## 2011-03-31 NOTE — Telephone Encounter (Signed)
Message left on patient's voicemail  To call back regarding refill on HCTZ. Patient needs appointment.

## 2011-03-31 NOTE — Telephone Encounter (Signed)
Spoke with patient  to advise that she needs appointment.  Hr orange card  expired in Jan 2012 and she has not got around to getting it renewed.  Consulted with Dr. Jennette Kettle and she advised may give a month's supply to give patient time to  see debra hill .

## 2011-03-31 NOTE — Telephone Encounter (Signed)
Ms. Lisa Taylor is calling back because she is sure that someone called her about getting authorization on her medications.  There was no notation on file.

## 2011-04-04 LAB — BASIC METABOLIC PANEL
BUN: 10
CO2: 24
Chloride: 106
Glucose, Bld: 133 — ABNORMAL HIGH
Potassium: 3.5

## 2011-04-04 LAB — CBC
Hemoglobin: 17.8 — ABNORMAL HIGH
MCHC: 33.3
MCHC: 33.5
MCV: 94.9
MCV: 96.2
Platelets: 221
RBC: 5.61 — ABNORMAL HIGH
RDW: 14.5
WBC: 9.3

## 2011-04-04 LAB — URINALYSIS, ROUTINE W REFLEX MICROSCOPIC
Ketones, ur: NEGATIVE
Nitrite: NEGATIVE
Protein, ur: >300 — AB
pH: 6.5

## 2011-04-04 LAB — CARDIAC PANEL(CRET KIN+CKTOT+MB+TROPI)
CK, MB: 2.8
Total CK: 132
Total CK: 152
Troponin I: 0.01
Troponin I: 0.03

## 2011-04-04 LAB — URINE MICROSCOPIC-ADD ON

## 2011-04-04 LAB — POCT CARDIAC MARKERS
CKMB, poc: 1.8
Myoglobin, poc: 121
Troponin i, poc: <0.05

## 2011-04-04 LAB — COMPREHENSIVE METABOLIC PANEL
AST: 17
Albumin: 3.9
CO2: 29
Calcium: 10.9 — ABNORMAL HIGH
Calcium: 9.6
Creatinine, Ser: 0.95
Creatinine, Ser: 0.98
GFR calc Af Amer: >60
GFR calc non Af Amer: 59 — ABNORMAL LOW
Glucose, Bld: 143 — ABNORMAL HIGH
Sodium: 136
Total Protein: 9 — ABNORMAL HIGH

## 2011-04-04 LAB — DIFFERENTIAL
Lymphocytes Relative: 11 — ABNORMAL LOW
Lymphs Abs: 0.9
Monocytes Relative: 3
Neutro Abs: 7.6
Neutrophils Relative %: 87 — ABNORMAL HIGH

## 2011-04-04 LAB — URINE CULTURE

## 2011-04-04 LAB — GLUCOSE, CAPILLARY
Glucose-Capillary: 116 — ABNORMAL HIGH
Glucose-Capillary: 143 — ABNORMAL HIGH

## 2011-04-04 LAB — CK TOTAL AND CKMB (NOT AT ARMC)
CK, MB: 2.6
Total CK: 141

## 2011-04-04 LAB — LIPASE, BLOOD: Lipase: 65 — ABNORMAL HIGH

## 2011-05-10 ENCOUNTER — Other Ambulatory Visit: Payer: Self-pay | Admitting: Family Medicine

## 2011-05-11 NOTE — Telephone Encounter (Signed)
Message left on voicemail for patient to contact out office. Will need to scheduled appointment and can then give enough medication to last until that appointment. Also sent message to pharmacy.

## 2011-05-11 NOTE — Telephone Encounter (Signed)
Message left on voicemail for patient to contact our office . Needs appointment and will then send in enough medication to last until appointment.

## 2011-05-17 ENCOUNTER — Other Ambulatory Visit: Payer: Self-pay | Admitting: Family Medicine

## 2011-05-17 DIAGNOSIS — I10 Essential (primary) hypertension: Secondary | ICD-10-CM

## 2011-05-17 NOTE — Telephone Encounter (Signed)
Pt has appointment scheduled with Dr. Elwyn Reach 06/08/11. Lisa Taylor

## 2011-05-17 NOTE — Telephone Encounter (Signed)
Patient has an appointment scheduled for 06/08/11 with Dr. Elwyn Reach.  Lisa Taylor

## 2011-06-08 ENCOUNTER — Ambulatory Visit (INDEPENDENT_AMBULATORY_CARE_PROVIDER_SITE_OTHER): Payer: Self-pay | Admitting: Emergency Medicine

## 2011-06-08 ENCOUNTER — Encounter: Payer: Self-pay | Admitting: Emergency Medicine

## 2011-06-08 VITALS — BP 146/70 | Temp 98.7°F | Ht 62.0 in | Wt 123.0 lb

## 2011-06-08 DIAGNOSIS — Z23 Encounter for immunization: Secondary | ICD-10-CM

## 2011-06-08 DIAGNOSIS — F17200 Nicotine dependence, unspecified, uncomplicated: Secondary | ICD-10-CM

## 2011-06-08 DIAGNOSIS — Z0000 Encounter for general adult medical examination without abnormal findings: Secondary | ICD-10-CM | POA: Insufficient documentation

## 2011-06-08 DIAGNOSIS — I10 Essential (primary) hypertension: Secondary | ICD-10-CM

## 2011-06-08 LAB — COMPREHENSIVE METABOLIC PANEL
CO2: 28 mEq/L (ref 19–32)
Creat: 0.84 mg/dL (ref 0.50–1.10)
Glucose, Bld: 107 mg/dL — ABNORMAL HIGH (ref 70–99)
Sodium: 137 mEq/L (ref 135–145)
Total Bilirubin: 0.5 mg/dL (ref 0.3–1.2)
Total Protein: 8.3 g/dL (ref 6.0–8.3)

## 2011-06-08 MED ORDER — CARVEDILOL 3.125 MG PO TABS: 3.1250 mg | ORAL_TABLET | Freq: Two times a day (BID) | ORAL | Status: DC

## 2011-06-08 MED ORDER — AMLODIPINE BESYLATE 10 MG PO TABS: 10.0000 mg | ORAL_TABLET | Freq: Every day | ORAL | Status: DC

## 2011-06-08 MED ORDER — HYDROCORTISONE 2.5 % EX CREA: TOPICAL_CREAM | Freq: Two times a day (BID) | CUTANEOUS | Status: DC

## 2011-06-08 MED ORDER — HYDROCHLOROTHIAZIDE 25 MG PO TABS: 25.0000 mg | ORAL_TABLET | Freq: Every day | ORAL | Status: DC

## 2011-06-08 NOTE — Assessment & Plan Note (Addendum)
Blood pressure is just above goal.  Will have her check her blood pressure at home and bring that record to her next appointment.  Will check a Cmet for calcium/albumin as it has been running a little high.  If calcium significantly elevated will decrease HCTZ to 12.5mg  and increase carvedilol.

## 2011-06-08 NOTE — Patient Instructions (Signed)
It was wonderful to meet you!  Your blood pressure is just a little bit higher than I would like.  Please take your blood pressure at home, at least 3 times a week and record those values.  Bring the record with you to your follow up visit.  We are also getting some blood today to check your calcium level.  You should get a letter with the results in the next 1-2 weeks.  If anything is wrong, I will call you.  Please follow up with me in 3 months to recheck your blood pressure.

## 2011-06-08 NOTE — Assessment & Plan Note (Signed)
Last checked in 09/2010.  No symptoms of hypercalcemia.  Will check Cmet and adjust medications as needed.  If consistently elevated would consider getting a PTH.

## 2011-06-08 NOTE — Assessment & Plan Note (Signed)
Patient states colonoscopy around 2009; last pap was normal in 2011; mammogram done this year.  Will give flu shot today.

## 2011-06-08 NOTE — Assessment & Plan Note (Signed)
Currently at 6cigs/day.  Trying to quit on her own; does not want resources at this time.  Provided support and encouragement.

## 2011-06-08 NOTE — Progress Notes (Signed)
  Subjective:    Patient ID: Kierrah Kilbride, female    DOB: 1950-09-28, 60 y.o.   MRN: [id number]  HPI Ms. Tasia Catchings is here for blood pressure follow up and medication refills.  HTN: Taking medication as prescribed - amlodipine 10mg  daily, HCTZ 25mg  daily, carvedilol 3.125mg  BID.  No adverse effects.  Denies chest pain, palpitations, leg edema, dizziness.  Hypercalcemia: Denies any abdominal pain, psych complaints.   Review of Systems See HPI, otherwise negative    Objective:   Physical Exam Vitals: reviewed General: thin woman, NAD HEENT: sclera white, MMM, no cervical LAD CV: RRR, 2/6 systolic murmur heard best at upper sternal borders Lungs: CTAB, no wheezes Ext: no edema; 2+ pedal pulses      Assessment & Plan:

## 2011-06-09 ENCOUNTER — Encounter: Payer: Self-pay | Admitting: Emergency Medicine

## 2011-08-08 ENCOUNTER — Other Ambulatory Visit: Payer: Self-pay | Admitting: Emergency Medicine

## 2011-08-08 DIAGNOSIS — Z1231 Encounter for screening mammogram for malignant neoplasm of breast: Secondary | ICD-10-CM

## 2011-09-05 ENCOUNTER — Ambulatory Visit (HOSPITAL_COMMUNITY)
Admission: RE | Admit: 2011-09-05 | Discharge: 2011-09-05 | Disposition: A | Discharge status: Home / Self Care | Payer: Self-pay | Source: Ambulatory Visit | Attending: Family Medicine | Admitting: Family Medicine

## 2011-09-05 DIAGNOSIS — Z1231 Encounter for screening mammogram for malignant neoplasm of breast: Secondary | ICD-10-CM | POA: Insufficient documentation

## 2011-09-15 ENCOUNTER — Ambulatory Visit (INDEPENDENT_AMBULATORY_CARE_PROVIDER_SITE_OTHER): Payer: Self-pay | Admitting: Emergency Medicine

## 2011-09-15 ENCOUNTER — Encounter: Payer: Self-pay | Admitting: Emergency Medicine

## 2011-09-15 VITALS — BP 160/70 | HR 56 | Temp 98.3°F | Ht 61.0 in | Wt 130.9 lb

## 2011-09-15 DIAGNOSIS — I10 Essential (primary) hypertension: Secondary | ICD-10-CM

## 2011-09-15 DIAGNOSIS — F17200 Nicotine dependence, unspecified, uncomplicated: Secondary | ICD-10-CM

## 2011-09-15 NOTE — Progress Notes (Signed)
  Subjective:    Patient ID: Lisa Taylor, female    DOB: 1950-11-04, 61 y.o.   MRN: [id number]  HPI Ms. Lisa Taylor is here today with her husband for follow up of HTN.  1. HTN: Compliant with medications. Brought in BP records from home (most in the 120s-130s/70s-80s); 4 reading >140/85 in last 2 months.  Denies CP, palpitations, leg edema, dizziness, vision changes.  2. Tobacco Abuse: Working on cutting back. Did smoke right before coming into clinic.   Review of Systems See HPI    Objective:   Physical Exam BP 160/70  Pulse 56  Temp(Src) 98.3 F (36.8 C) (Oral)  Ht 5\' 1"  (1.549 m)  Wt 130 lb 14.4 oz (59.376 kg)  BMI 24.73 kg/m2 Gen: alert, cooperative, NAD HEENT: AT/[place], sclera white, PERRL, oropharynx clear CV: mildly bradycardia, regular rhythm, no murmurs or gallops Pulm: CTAB, no wheezes or rhonchi Ext: no edema Skin: normal Neuro: gait normal      Assessment & Plan:

## 2011-09-15 NOTE — Patient Instructions (Signed)
It was nice to see you!  Thank you for bringing in your blood pressure recordings from home.  We decided to have you been seen in the pharmacy clinic to do ambulatory blood pressure monitoring.  On your way out today, stop by the front desk and they will be able to schedule that appointment.  In the meantime, keep taking your medications and please continue to record by blood pressure at home (bring the numbers to your appointment in pharmacy clinic).  We did draw labs today, I will send a letter with the results or call you if anything is wrong.  I will see you back in about 3 months for follow up.

## 2011-09-15 NOTE — Assessment & Plan Note (Addendum)
Discrepancy between home values and in office values.  Will refer to pharmacy clinic for 24-hr BP monitoring.  Pt to continue checking her BP at home until that appointment. Will check Cmet and lipid panel today.  Pt expresses agreement and understanding of plan.  Follow up with me in 3 months.

## 2011-09-15 NOTE — Assessment & Plan Note (Signed)
Patient trying to quit.  Informed of free tobacco replacement from [employer].  Patient not interested in other help at this time.

## 2011-09-16 LAB — COMPREHENSIVE METABOLIC PANEL
ALT: 10 U/L (ref 0–35)
CO2: 27 mEq/L (ref 19–32)
Creat: 1.02 mg/dL (ref 0.50–1.10)
Glucose, Bld: 117 mg/dL — ABNORMAL HIGH (ref 70–99)
Total Bilirubin: 0.5 mg/dL (ref 0.3–1.2)

## 2011-09-16 LAB — LIPID PANEL
Cholesterol: 247 mg/dL — ABNORMAL HIGH (ref 0–200)
Total CHOL/HDL Ratio: 3.4 Ratio
Triglycerides: 70 mg/dL (ref <150)
VLDL: 14 mg/dL (ref 0–40)

## 2011-09-18 ENCOUNTER — Telehealth: Payer: Self-pay | Admitting: Emergency Medicine

## 2011-09-18 ENCOUNTER — Encounter: Payer: Self-pay | Admitting: Emergency Medicine

## 2011-09-18 ENCOUNTER — Other Ambulatory Visit: Payer: Self-pay | Admitting: Emergency Medicine

## 2011-09-18 MED ORDER — SIMVASTATIN 40 MG PO TABS: 40.0000 mg | ORAL_TABLET | Freq: Every day | ORAL | Status: AC

## 2011-09-18 NOTE — Telephone Encounter (Signed)
Pt is returning call to Dr Elwyn Reach

## 2011-09-18 NOTE — Telephone Encounter (Signed)
LDL came back elevated at >161.  Will call patient and have her restart simvastatin 40mg  daily.

## 2011-09-18 NOTE — Telephone Encounter (Signed)
Discussed lab results with patient.  Will restart simvastatin 40mg  daily.

## 2011-09-21 ENCOUNTER — Ambulatory Visit (INDEPENDENT_AMBULATORY_CARE_PROVIDER_SITE_OTHER): Payer: Self-pay | Admitting: Pharmacist

## 2011-09-21 VITALS — BP 148/66 | HR 49 | Ht 60.0 in | Wt 131.1 lb

## 2011-09-21 DIAGNOSIS — I10 Essential (primary) hypertension: Secondary | ICD-10-CM

## 2011-09-21 NOTE — Patient Instructions (Addendum)
It was nice to meet you!  Your blood pressure study was normal: your overall average blood pressure 131/68 mmHg with heart rate 52 bpm  Continue your medications as prescribed. Make sure you are only taking 1 Coreg (carvedilol) 3.125mg  tablet twice daily.   Regular follow-up with Dr. Elwyn Reach

## 2011-09-22 ENCOUNTER — Encounter: Payer: Self-pay | Admitting: Pharmacist

## 2011-09-22 NOTE — Progress Notes (Signed)
  Subjective:    Patient ID: Lisa Taylor, female    DOB: [birth year], 61 y.o.   MRN: [id number]  HPI Mrs. Lisa Taylor is a 61yoAA female that presented to the clinic per Dr. Jonah Blue referral for ambulatory blood pressure monitoring due to several elevated clinic readings. Patient brought home-BP log which showed most readings <140/90 mmHg. She reports having hypertension for several years and having tried multiple medications. Upon medication review, it was found that patient had mistakenly been taking 2 tablets of carvedilol once daily instead of 1 tablet BID.   Patient reported no issues with the study and an "normal day".   Current Antihypertensive Medications: Hydrochlorothiazide 25mg  daily Carvedilol 3.125mg  BID   Amlodipine 10mg  daily   Review of Systems     Objective:   Physical Exam  Last 2 Office readings: 160/70 mmHg   HR 56    AND  146/70       24-ABPM Study Data:  Arm Placement left arm   Overall Mean 24hr BP:  131/68 mmHg HR: 52  Daytime Mean BP:   134/42mmHg HR: 52  Nighttime Mean BP:   119/62  HR: 53  Dipping Pattern:  11.4% in Systolic and 11.3 in % Diastolic                     [normal dipping ~10-20%]      Assessment & Plan:   61 yo AA female who appears to be at Goal BP based on Amb BP monitoring.  Her HR of <60 is concerning and she admits that she does experience fatigue and occasional light-headedness.    No change in drug therapy suggested at this time.   She was advised to take her carvedilol 3.125 BID and NOT taken both pills in the AM.   This may improve tolerability.   Reassess at next visit with Dr. Elwyn Reach.   Patient seen with Benjaman Pott PharmD, Birdena Crandall, PharmD Candidate and [place] Smith, Pharmacy Resident.

## 2011-09-22 NOTE — Progress Notes (Signed)
  Subjective:    Patient ID: Lisa Taylor, female    DOB: 01-07-1951, 61 y.o.   MRN: [id number]  HPI Reviewed and agree with Dr. Macky Lower management    Review of Systems     Objective:   Physical Exam        Assessment & Plan:

## 2011-09-22 NOTE — Assessment & Plan Note (Signed)
  61 yo AA female who appears to be at Goal BP based on Amb BP monitoring.  Her HR of <60 is concerning and she admits that she does experience fatigue and occasional light-headedness.    No change in drug therapy suggested at this time.   She was advised to take her carvedilol 3.125 BID and NOT taken both pills in the AM.   This may improve tolerability.   Reassess at next visit with Dr. Elwyn Reach.   Patient seen with Benjaman Pott PharmD, Birdena Crandall, PharmD Candidate and [place] Smith, Pharmacy Resident.

## 2011-11-06 ENCOUNTER — Other Ambulatory Visit: Payer: Self-pay | Admitting: Emergency Medicine

## 2011-11-06 DIAGNOSIS — I10 Essential (primary) hypertension: Secondary | ICD-10-CM

## 2011-11-06 MED ORDER — HYDROCHLOROTHIAZIDE 25 MG PO TABS: 25.0000 mg | ORAL_TABLET | Freq: Every day | ORAL | Status: DC

## 2011-11-06 MED ORDER — AMLODIPINE BESYLATE 10 MG PO TABS: 10.0000 mg | ORAL_TABLET | Freq: Every day | ORAL | Status: DC

## 2011-11-06 NOTE — Telephone Encounter (Signed)
Prescriptions sent electronically to [facility].

## 2011-12-04 ENCOUNTER — Other Ambulatory Visit: Payer: Self-pay | Admitting: *Deleted

## 2011-12-04 DIAGNOSIS — I10 Essential (primary) hypertension: Secondary | ICD-10-CM

## 2011-12-04 MED ORDER — CARVEDILOL 3.125 MG PO TABS: 3.1250 mg | ORAL_TABLET | Freq: Two times a day (BID) | ORAL | Status: DC

## 2012-03-11 ENCOUNTER — Other Ambulatory Visit: Payer: Self-pay | Admitting: *Deleted

## 2012-03-11 DIAGNOSIS — I10 Essential (primary) hypertension: Secondary | ICD-10-CM

## 2012-03-11 MED ORDER — HYDROCHLOROTHIAZIDE 25 MG PO TABS: 25.0000 mg | ORAL_TABLET | Freq: Every day | ORAL | Status: DC

## 2012-03-22 ENCOUNTER — Other Ambulatory Visit: Payer: Self-pay | Admitting: *Deleted

## 2012-03-22 DIAGNOSIS — I10 Essential (primary) hypertension: Secondary | ICD-10-CM

## 2012-03-26 MED ORDER — AMLODIPINE BESYLATE 10 MG PO TABS: 10.0000 mg | ORAL_TABLET | Freq: Every day | ORAL | Status: DC

## 2012-04-17 ENCOUNTER — Other Ambulatory Visit: Payer: Self-pay | Admitting: *Deleted

## 2012-04-17 DIAGNOSIS — I10 Essential (primary) hypertension: Secondary | ICD-10-CM

## 2012-04-17 MED ORDER — CARVEDILOL 3.125 MG PO TABS: 3.1250 mg | ORAL_TABLET | Freq: Two times a day (BID) | ORAL | Status: DC

## 2012-05-20 ENCOUNTER — Other Ambulatory Visit: Payer: Self-pay | Admitting: *Deleted

## 2012-06-05 ENCOUNTER — Other Ambulatory Visit: Payer: Self-pay | Admitting: *Deleted

## 2012-06-05 MED ORDER — HYDROCORTISONE 2.5 % EX CREA: TOPICAL_CREAM | Freq: Two times a day (BID) | CUTANEOUS | Status: DC

## 2012-06-18 ENCOUNTER — Other Ambulatory Visit: Payer: Self-pay | Admitting: *Deleted

## 2012-06-18 MED ORDER — HYDROCORTISONE 2.5 % EX CREA: TOPICAL_CREAM | Freq: Two times a day (BID) | CUTANEOUS | Status: DC

## 2012-08-08 ENCOUNTER — Other Ambulatory Visit: Payer: Self-pay | Admitting: *Deleted

## 2012-08-08 ENCOUNTER — Other Ambulatory Visit: Payer: Self-pay | Admitting: Emergency Medicine

## 2012-08-08 DIAGNOSIS — I10 Essential (primary) hypertension: Secondary | ICD-10-CM

## 2012-08-08 MED ORDER — HYDROCHLOROTHIAZIDE 25 MG PO TABS: 25.0000 mg | ORAL_TABLET | Freq: Every day | ORAL | Status: DC

## 2012-09-25 ENCOUNTER — Other Ambulatory Visit: Payer: Self-pay | Admitting: *Deleted

## 2012-09-25 DIAGNOSIS — I10 Essential (primary) hypertension: Secondary | ICD-10-CM

## 2012-09-25 MED ORDER — CARVEDILOL 3.125 MG PO TABS: 3.1250 mg | ORAL_TABLET | Freq: Two times a day (BID) | ORAL | Status: DC

## 2012-09-25 NOTE — Telephone Encounter (Signed)
Rx for Carvedilol sent w/o refills as pt not seen for greater than 76yr in office.

## 2012-10-21 ENCOUNTER — Other Ambulatory Visit: Payer: Self-pay | Admitting: Family Medicine

## 2012-12-02 ENCOUNTER — Other Ambulatory Visit: Payer: Self-pay | Admitting: Emergency Medicine

## 2013-01-10 ENCOUNTER — Other Ambulatory Visit: Payer: Self-pay | Admitting: Emergency Medicine

## 2013-01-20 ENCOUNTER — Other Ambulatory Visit: Payer: Self-pay | Admitting: Family Medicine

## 2013-02-18 ENCOUNTER — Other Ambulatory Visit: Payer: Self-pay | Admitting: Emergency Medicine

## 2013-03-31 ENCOUNTER — Other Ambulatory Visit: Payer: Self-pay | Admitting: Emergency Medicine

## 2013-04-01 ENCOUNTER — Other Ambulatory Visit: Payer: Self-pay | Admitting: Emergency Medicine

## 2013-04-18 ENCOUNTER — Other Ambulatory Visit: Payer: Self-pay | Admitting: Emergency Medicine

## 2015-01-16 ENCOUNTER — Emergency Department (HOSPITAL_COMMUNITY)
Admission: EM | Admit: 2015-01-16 | Discharge: 2015-01-16 | Disposition: A | Discharge status: Home / Self Care | Payer: Self-pay | Attending: Emergency Medicine | Admitting: Emergency Medicine

## 2015-01-16 ENCOUNTER — Encounter (HOSPITAL_COMMUNITY): Payer: Self-pay | Admitting: *Deleted

## 2015-01-16 DIAGNOSIS — R519 Headache, unspecified: Secondary | ICD-10-CM

## 2015-01-16 DIAGNOSIS — R51 Headache: Secondary | ICD-10-CM | POA: Insufficient documentation

## 2015-01-16 DIAGNOSIS — R590 Localized enlarged lymph nodes: Secondary | ICD-10-CM | POA: Insufficient documentation

## 2015-01-16 DIAGNOSIS — H9201 Otalgia, right ear: Secondary | ICD-10-CM | POA: Insufficient documentation

## 2015-01-16 DIAGNOSIS — Z720 Tobacco use: Secondary | ICD-10-CM | POA: Insufficient documentation

## 2015-01-16 DIAGNOSIS — M542 Cervicalgia: Secondary | ICD-10-CM | POA: Insufficient documentation

## 2015-01-16 DIAGNOSIS — I10 Essential (primary) hypertension: Secondary | ICD-10-CM | POA: Insufficient documentation

## 2015-01-16 HISTORY — DX: Essential (primary) hypertension: I10

## 2015-01-16 LAB — CBC WITH DIFFERENTIAL/PLATELET
BASOS PCT: 0 % (ref 0–1)
Basophils Absolute: 0 10*3/uL (ref 0.0–0.1)
EOS PCT: 1 % (ref 0–5)
Eosinophils Absolute: 0.1 10*3/uL (ref 0.0–0.7)
HEMATOCRIT: 45.6 % (ref 36.0–46.0)
Hemoglobin: 15.6 g/dL — ABNORMAL HIGH (ref 12.0–15.0)
LYMPHS ABS: 2.1 10*3/uL (ref 0.7–4.0)
Lymphocytes Relative: 39 % (ref 12–46)
MCH: 30.4 pg (ref 26.0–34.0)
MCHC: 34.2 g/dL (ref 30.0–36.0)
MCV: 88.9 fL (ref 78.0–100.0)
MONOS PCT: 8 % (ref 3–12)
Monocytes Absolute: 0.4 10*3/uL (ref 0.1–1.0)
NEUTROS PCT: 52 % (ref 43–77)
Neutro Abs: 2.8 10*3/uL (ref 1.7–7.7)
Platelets: 248 10*3/uL (ref 150–400)
RBC: 5.13 MIL/uL — AB (ref 3.87–5.11)
RDW: 13.9 % (ref 11.5–15.5)
WBC: 5.3 10*3/uL (ref 4.0–10.5)

## 2015-01-16 LAB — BASIC METABOLIC PANEL
Anion gap: 9 (ref 5–15)
BUN: 9 mg/dL (ref 6–20)
CO2: 24 mmol/L (ref 22–32)
Calcium: 10.3 mg/dL (ref 8.9–10.3)
Chloride: 109 mmol/L (ref 101–111)
Creatinine, Ser: 0.93 mg/dL (ref 0.44–1.00)
GFR calc Af Amer: >60 mL/min (ref >60)
GLUCOSE: 116 mg/dL — AB (ref 65–99)
Potassium: 3.7 mmol/L (ref 3.5–5.1)
Sodium: 142 mmol/L (ref 135–145)

## 2015-01-16 MED ORDER — ACETAMINOPHEN 325 MG PO TABS
650.0000 mg | ORAL_TABLET | Freq: Once | ORAL | Status: AC
Start: 2015-01-16 — End: 2015-01-16
  Administered 2015-01-16: 650 mg via ORAL
  Filled 2015-01-16: qty 2

## 2015-01-16 MED ORDER — OXYCODONE-ACETAMINOPHEN 5-325 MG PO TABS
1.0000 | ORAL_TABLET | Freq: Once | ORAL | Status: AC
  Administered 2015-01-16: 1 via ORAL
  Filled 2015-01-16: qty 1

## 2015-01-16 MED ORDER — ACETAMINOPHEN 500 MG PO TABS
500.0000 mg | ORAL_TABLET | Freq: Four times a day (QID) | ORAL | Status: DC | PRN
Start: 2015-01-16 — End: 2015-01-26

## 2015-01-16 MED ORDER — HYDRALAZINE HCL 20 MG/ML IJ SOLN
5.0000 mg | Freq: Once | INTRAMUSCULAR | Status: AC
  Administered 2015-01-16: 5 mg via INTRAVENOUS
  Filled 2015-01-16: qty 1

## 2015-01-16 MED ORDER — HYDROCHLOROTHIAZIDE 25 MG PO TABS: 25.0000 mg | ORAL_TABLET | Freq: Every day | ORAL | Status: DC

## 2015-01-16 MED ORDER — HYDRALAZINE HCL 20 MG/ML IJ SOLN
5.0000 mg | Freq: Once | INTRAMUSCULAR | Status: AC
  Administered 2015-01-16: 5 mg via INTRAVENOUS

## 2015-01-16 NOTE — Discharge Instructions (Signed)
Hypertension °Hypertension, commonly called high blood pressure, is when the force of blood pumping through your arteries is too strong. Your arteries are the blood vessels that carry blood from your heart throughout your body. A blood pressure reading consists of a higher number over a lower number, such as 110/72. The higher number (systolic) is the pressure inside your arteries when your heart pumps. The lower number (diastolic) is the pressure inside your arteries when your heart relaxes. Ideally you want your blood pressure below 120/80. °Hypertension forces your heart to work harder to pump blood. Your arteries may become narrow or stiff. Having hypertension puts you at risk for heart disease, stroke, and other problems.  °RISK FACTORS °Some risk factors for high blood pressure are controllable. Others are not.  °Risk factors you cannot control include:  °· Race. You may be at higher risk if you are African American. °· Age. Risk increases with age. °· Gender. Men are at higher risk than women before age 45 years. After age 65, women are at higher risk than men. °Risk factors you can control include: °· Not getting enough exercise or physical activity. °· Being overweight. °· Getting too much fat, sugar, calories, or salt in your diet. °· Drinking too much alcohol. °SIGNS AND SYMPTOMS °Hypertension does not usually cause signs or symptoms. Extremely high blood pressure (hypertensive crisis) may cause headache, anxiety, shortness of breath, and nosebleed. °DIAGNOSIS  °To check if you have hypertension, your health care provider will measure your blood pressure while you are seated, with your arm held at the level of your heart. It should be measured at least twice using the same arm. Certain conditions can cause a difference in blood pressure between your right and left arms. A blood pressure reading that is higher than normal on one occasion does not mean that you need treatment. If one blood pressure reading  is high, ask your health care provider about having it checked again. °TREATMENT  °Treating high blood pressure includes making lifestyle changes and possibly taking medicine. Living a healthy lifestyle can help lower high blood pressure. You may need to change some of your habits. °Lifestyle changes may include: °· Following the DASH diet. This diet is high in fruits, vegetables, and whole grains. It is low in salt, red meat, and added sugars. °· Getting at least 2½ hours of brisk physical activity every week. °· Losing weight if necessary. °· Not smoking. °· Limiting alcoholic beverages. °· Learning ways to reduce stress. ° If lifestyle changes are not enough to get your blood pressure under control, your health care provider may prescribe medicine. You may need to take more than one. Work closely with your health care provider to understand the risks and benefits. °HOME CARE INSTRUCTIONS °· Have your blood pressure rechecked as directed by your health care provider.   °· Take medicines only as directed by your health care provider. Follow the directions carefully. Blood pressure medicines must be taken as prescribed. The medicine does not work as well when you skip doses. Skipping doses also puts you at risk for problems.   °· Do not smoke.   °· Monitor your blood pressure at home as directed by your health care provider.  °SEEK MEDICAL CARE IF:  °· You think you are having a reaction to medicines taken. °· You have recurrent headaches or feel dizzy. °· You have swelling in your ankles. °· You have trouble with your vision. °SEEK IMMEDIATE MEDICAL CARE IF: °· You develop a severe headache or confusion. °·   You have unusual weakness, numbness, or feel faint.  You have severe chest or abdominal pain.  You vomit repeatedly.  You have trouble breathing. MAKE SURE YOU:   Understand these instructions.  Will watch your condition.  Will get help right away if you are not doing well or get worse. Document  Released: 06/19/2005 Document Revised: 11/03/2013 Document Reviewed: 04/11/2013 [facility] Patient Information 2015 [place], [place]. This information is not intended to replace advice given to you by your health care provider. Make sure you discuss any questions you have with your health care provider. DASH Eating Plan DASH stands for "Dietary Approaches to Stop Hypertension." The DASH eating plan is a healthy eating plan that has been shown to reduce high blood pressure (hypertension). Additional health benefits may include reducing the risk of type 2 diabetes mellitus, heart disease, and stroke. The DASH eating plan may also help with weight loss. WHAT DO I NEED TO KNOW ABOUT THE DASH EATING PLAN? For the DASH eating plan, you will follow these general guidelines:  Choose foods with a percent daily value for sodium of less than 5% (as listed on the food label).  Use salt-free seasonings or herbs instead of table salt or sea salt.  Check with your health care provider or pharmacist before using salt substitutes.  Eat lower-sodium products, often labeled as "lower sodium" or "no salt added."  Eat fresh foods.  Eat more vegetables, fruits, and low-fat dairy products.  Choose whole grains. Look for the word "whole" as the first word in the ingredient list.  Choose fish and skinless chicken or [place] more often than red meat. Limit fish, poultry, and meat to 6 oz (170 g) each day.  Limit sweets, desserts, sugars, and sugary drinks.  Choose heart-healthy fats.  Limit cheese to 1 oz (28 g) per day.  Eat more home-cooked food and less restaurant, buffet, and fast food.  Limit fried foods.  Cook foods using methods other than frying.  Limit canned vegetables. If you do use them, rinse them well to decrease the sodium.  When eating at a restaurant, ask that your food be prepared with less salt, or no salt if possible. WHAT FOODS CAN I EAT? Seek help from a dietitian for individual  calorie needs. Grains Whole grain or whole wheat bread. Brown rice. Whole grain or whole wheat pasta. Quinoa, bulgur, and whole grain cereals. Low-sodium cereals. Corn or whole wheat flour tortillas. Whole grain cornbread. Whole grain crackers. Low-sodium crackers. Vegetables Fresh or frozen vegetables (raw, steamed, roasted, or grilled). Low-sodium or reduced-sodium tomato and vegetable juices. Low-sodium or reduced-sodium tomato sauce and paste. Low-sodium or reduced-sodium canned vegetables.  Fruits All fresh, canned (in natural juice), or frozen fruits. Meat and Other Protein Products Ground beef (85% or leaner), grass-fed beef, or beef trimmed of fat. Skinless chicken or [place]. Ground chicken or [place]. Pork trimmed of fat. All fish and seafood. Eggs. Dried beans, peas, or lentils. Unsalted nuts and seeds. Unsalted canned beans. Dairy Low-fat dairy products, such as skim or 1% milk, 2% or reduced-fat cheeses, low-fat ricotta or cottage cheese, or plain low-fat yogurt. Low-sodium or reduced-sodium cheeses. Fats and Oils Tub margarines without trans fats. Light or reduced-fat mayonnaise and salad dressings (reduced sodium). Avocado. Safflower, olive, or canola oils. Natural peanut or almond butter. Other Unsalted popcorn and pretzels. The items listed above may not be a complete list of recommended foods or beverages. Contact your dietitian for more options. WHAT FOODS ARE NOT RECOMMENDED? Grains White bread.  White pasta. White rice. Refined cornbread. Bagels and croissants. Crackers that contain trans fat. Vegetables Creamed or fried vegetables. Vegetables in a cheese sauce. Regular canned vegetables. Regular canned tomato sauce and paste. Regular tomato and vegetable juices. Fruits Dried fruits. Canned fruit in light or heavy syrup. Fruit juice. Meat and Other Protein Products Fatty cuts of meat. Ribs, chicken wings, bacon, sausage, bologna, salami, chitterlings, fatback, hot dogs,  bratwurst, and packaged luncheon meats. Salted nuts and seeds. Canned beans with salt. Dairy Whole or 2% milk, cream, half-and-half, and cream cheese. Whole-fat or sweetened yogurt. Full-fat cheeses or blue cheese. Nondairy creamers and whipped toppings. Processed cheese, cheese spreads, or cheese curds. Condiments Onion and garlic salt, seasoned salt, table salt, and sea salt. Canned and packaged gravies. Worcestershire sauce. Tartar sauce. Barbecue sauce. Teriyaki sauce. Soy sauce, including reduced sodium. Steak sauce. Fish sauce. Oyster sauce. Cocktail sauce. Horseradish. Ketchup and mustard. Meat flavorings and tenderizers. Bouillon cubes. Hot sauce. Tabasco sauce. Marinades. Taco seasonings. Relishes. Fats and Oils Butter, stick margarine, lard, shortening, ghee, and bacon fat. Coconut, palm kernel, or palm oils. Regular salad dressings. Other Pickles and olives. Salted popcorn and pretzels. The items listed above may not be a complete list of foods and beverages to avoid. Contact your dietitian for more information. WHERE CAN I FIND MORE INFORMATION? National Heart, Lung, and Blood Institute: travelstabloid.com Document Released: 06/08/2011 Document Revised: 11/03/2013 Document Reviewed: 04/23/2013 [facility] Patient Information 2015 [place], [place]. This information is not intended to replace advice given to you by your health care provider. Make sure you discuss any questions you have with your health care provider. General Headache A headache is pain or discomfort felt around the head or neck area. The specific cause of a headache may not be found. There are many causes and types of headaches. A few common ones are:  Tension headaches.  Migraine headaches.  Cluster headaches.  Chronic daily headaches. HOME CARE INSTRUCTIONS   Keep all follow-up appointments with your caregiver or any specialist referral.  Only take over-the-counter or prescription  medicines for pain or discomfort as directed by your caregiver.  Lie down in a dark, quiet room when you have a headache.  Keep a headache journal to find out what may trigger your migraine headaches. For example, write down:  What you eat and drink.  How much sleep you get.  Any change to your diet or medicines.  Try massage or other relaxation techniques.  Put ice packs or heat on the head and neck. Use these 3 to 4 times per day for 15 to 20 minutes each time, or as needed.  Limit stress.  Sit up straight, and do not tense your muscles.  Quit smoking if you smoke.  Limit alcohol use.  Decrease the amount of caffeine you drink, or stop drinking caffeine.  Eat and sleep on a regular schedule.  Get 7 to 9 hours of sleep, or as recommended by your caregiver.  Keep lights dim if bright lights bother you and make your headaches worse. SEEK MEDICAL CARE IF:   You have problems with the medicines you were prescribed.  Your medicines are not working.  You have a change from the usual headache.  You have nausea or vomiting. SEEK IMMEDIATE MEDICAL CARE IF:   Your headache becomes severe.  You have a fever.  You have a stiff neck.  You have loss of vision.  You have muscular weakness or loss of muscle control.  You start losing your balance  or have trouble walking.  You feel faint or pass out.  You have severe symptoms that are different from your first symptoms. MAKE SURE YOU:   Understand these instructions.  Will watch your condition.  Will get help right away if you are not doing well or get worse. Document Released: 06/19/2005 Document Revised: 09/11/2011 Document Reviewed: 07/05/2011 [facility] Patient Information 2015 [place], [place]. This information is not intended to replace advice given to you by your health care provider. Make sure you discuss any questions you have with your health care provider.

## 2015-01-16 NOTE — Care Management Note (Signed)
Case Management Note  Patient Details  Name: Lisa Taylor MRN: [id number] Date of Birth: 12-Dec-1950  Subjective/Objective:     Pleasant F seen in the ED for headache/earache who is being treated for HBP. CM asked to talk with the pt about f/u with PCP. Talked about the importance of BP monitoring while taking meds x 2 weeks to provide accurate record to MD she will be seeing in the future.  Pt appreciative of all information. Has been seen at [facility] in past but it has been over three yrs since her last visit.               Action/Plan: CM spoke with pt who confirms self pay [facility] resident with no pcp.  CM discussed and provided written information for self pay pcps, discussed the importance of pcp vs EDP services for f/u care, www.needymeds.org, www.goodrx.com, discounted pharmacies and other [employer] such as [employer] [facility] [employer], affordable care act,  [place] med assist, financial assistance, self pay dental services, [place] med assist, DSS and  health department  Reviewed resources for [employer] self pay pcps like [facility], family [facility] at [employer], [facility], [facility], local urgent care centers, [facility], [facility] family practice, general medical clinics, [facility] [place], [facility] urgent care plus others, medication resources, [facility] out patient pharmacies and housing Pt voiced understanding and appreciation of resources provided   Provided [facility] contact information Pt agreed to a referral Cm completed referral Pt to be contact by [facility] clinical liaison    Expected Discharge Date:                  Expected Discharge Plan:  Home/Self Care  In-House Referral:     Discharge planning Services  CM Consult, [facility] / [facility] (established/new)  Post Acute Care Choice:    Choice offered to:     DME Arranged:    DME Agency:     HH Arranged:    [place] Agency:     Status of Service:  Completed, signed off  Medicare Important  Message Given:    Date Medicare IM Given:    Medicare IM give by:    Date Additional Medicare IM Given:    Additional Medicare Important Message give by:     If discussed at [place] of Stay Meetings, dates discussed:    Additional Comments:  Delrae Sawyers, RN 01/16/2015, 3:48 PM

## 2015-01-16 NOTE — ED Notes (Signed)
Pt reports right ear pain, neck pain and headache x 3 days. Pt is hypertensive bp 244/100, hx of HTN but not taking meds x 3 years.

## 2015-01-16 NOTE — ED Provider Notes (Signed)
CSN: [id number]     Arrival date & time 01/16/15  1148 History   First MD Initiated Contact with Patient 01/16/15 1150     Chief Complaint  Patient presents with  . Hypertension  . Headache   Lisa Taylor is a 64 y.o. female with a history of hypertension who presents to the ED complaining of right ear pain and a right-sided headache for the past 3 days. Patient reports a history of hypertension but has not been taking her blood pressure medicines for the past 3 years. Patient is a [facility] of the [facility]. She complains of an 8 out of 10 right frontal headache which is been constant for the past 2 days. She complains of right-sided sharp pain in her ear that radiates to the right side of her head. She has taking nothing for treatment today. The patient denies fevers, chills, abdominal pain, nausea, vomiting, chest pain, shortness of breath, neck stiffness, ear discharge, postnasal drip, runny nose, numbness, tingling, weakness, back pain, lightheadedness, dizziness, double vision, sore throat, urinary symptoms or rashes.  (Consider location/radiation/quality/duration/timing/severity/associated sxs/prior Treatment) HPI  Past Medical History  Diagnosis Date  . Hypertension    History reviewed. No pertinent past surgical history. History reviewed. No pertinent family history. History  Substance Use Topics  . Smoking status: Current Every Day Smoker -- 0.30 packs/day    Types: Cigarettes  . Smokeless tobacco: Not on file  . Alcohol Use: No   OB History    No data available     Review of Systems  Constitutional: Negative for fever and chills.  HENT: Positive for ear pain. Negative for congestion, dental problem, ear discharge, facial swelling, hearing loss, nosebleeds, postnasal drip, rhinorrhea, sneezing and sore throat.   Eyes: Negative for pain and visual disturbance.  Respiratory: Negative for cough and shortness of breath.   Cardiovascular: Negative for chest pain.   Gastrointestinal: Negative for nausea, vomiting, abdominal pain and diarrhea.  Genitourinary: Negative for dysuria, urgency, frequency and difficulty urinating.  Musculoskeletal: Negative for back pain and neck stiffness.  Skin: Negative for rash.  Neurological: Negative for dizziness, syncope, weakness, light-headedness and headaches.      Allergies  Codeine  Home Medications   Prior to Admission medications   Medication Sig Start Date End Date Taking? Authorizing Provider  acetaminophen (TYLENOL) 500 MG tablet Take 1 tablet (500 mg total) by mouth every 6 (six) hours as needed for mild pain or moderate pain. 01/16/15   Waynetta Pean, PA-C  amLODipine (NORVASC) 10 MG tablet TAKE ONE TABLET BY MOUTH ONCE DAILY **NEEDS  OFFICE  VISIT  PRIOR  TO  ADDITIONAL  REFILLS Patient not taking: Reported on 01/16/2015 02/18/13   Melony Overly, MD  carvedilol (COREG) 3.125 MG tablet TAKE ONE TABLET BY MOUTH TWICE DAILY FOR BLOOD PRESSURE Patient not taking: Reported on 01/16/2015 02/18/13   Melony Overly, MD  hydrochlorothiazide (HYDRODIURIL) 25 MG tablet Take 1 tablet (25 mg total) by mouth daily. 01/16/15   Waynetta Pean, PA-C  hydrocortisone 2.5 % cream Apply topically 2 (two) times daily. Apply to legs 2 times a day. Dispense 30 grams Patient not taking: Reported on 01/16/2015 06/18/12   Melony Overly, MD   BP 169/59 mmHg  Pulse 57  Temp(Src) 98.3 F (36.8 C) (Oral)  Resp 13  SpO2 99% Physical Exam  Constitutional: She is oriented to person, place, and time. She appears well-developed and well-nourished. No distress.  Nontoxic appearing.  HENT:  Head: Normocephalic and  atraumatic.  Right Ear: External ear normal.  Left Ear: External ear normal.  Mouth/Throat: Oropharynx is clear and moist. No oropharyngeal exudate.  Bilateral tympanic membranes are pearly-gray without erythema or loss of landmarks. No mastoid tenderness.   Eyes: Conjunctivae and EOM are normal. Pupils are equal, round, and  reactive to light. Right eye exhibits no discharge. Left eye exhibits no discharge.  Neck: Normal range of motion. Neck supple. No JVD present. No tracheal deviation present.  Mild bilateral tender cervical LAD.   Cardiovascular: Normal rate, regular rhythm, normal heart sounds and intact distal pulses.  Exam reveals no gallop and no friction rub.   No murmur heard. Bilateral radial, posterior tibialis and dorsalis pedis pulses are intact.    Pulmonary/Chest: Effort normal and breath sounds normal. No respiratory distress. She has no wheezes. She has no rales.  Lungs are clear to auscultation bilaterally.  Abdominal: Soft. Bowel sounds are normal. She exhibits no distension. There is no tenderness. There is no guarding.  Musculoskeletal: She exhibits no edema or tenderness.  No lower extremity edema or tenderness. She has 5 out of 5 strength in her bilateral upper and lower extremities. She is able to ambulate without difficulty or assistance. Steady gait.  Lymphadenopathy:    She has cervical adenopathy.  Neurological: She is alert and oriented to person, place, and time. No cranial nerve deficit. Coordination normal.  Patient is alert and oriented 3. Cranial nerves are intact. No pronator drift. Finger to nose intact bilaterally. Sensation is intact to bilateral upper and lower extremities.   Skin: Skin is warm and dry. No rash noted. She is not diaphoretic. No erythema. No pallor.  Psychiatric: She has a normal mood and affect. Her behavior is normal.  Nursing note and vitals reviewed.   ED Course  Procedures (including critical care time) Labs Review Labs Reviewed  BASIC METABOLIC PANEL - Abnormal; Notable for the following:    Glucose, Bld 116 (*)    All other components within normal limits  CBC WITH DIFFERENTIAL/PLATELET - Abnormal; Notable for the following:    RBC 5.13 (*)    Hemoglobin 15.6 (*)    All other components within normal limits    Imaging Review No results  found.   EKG Interpretation   Date/Time:  Saturday January 16 2015 12:05:20 EDT Ventricular Rate:  58 PR Interval:  174 QRS Duration: 82 QT Interval:  410 QTC Calculation: 403 R Axis:   45 Text Interpretation:  Sinus rhythm Minimal ST elevation, anterior leads  Baseline wander in lead(s) II III aVF V2 V4 No significant change since  last tracing Confirmed by ALLEN  MD, ANTHONY ([id number]) on 01/16/2015 12:25:42  PM      Filed Vitals:   01/16/15 1418 01/16/15 1500 01/16/15 1523 01/16/15 1551  BP: 194/67 186/55 168/80 169/59  Pulse: 51 41 64 57  Temp:      TempSrc:      Resp: '20 24 13 13  '$ SpO2: 100% 94% 99% 99%     MDM   Meds given in ED:  Medications  oxyCODONE-acetaminophen (PERCOCET/ROXICET) 5-325 MG per tablet 1 tablet (not administered)  hydrALAZINE (APRESOLINE) injection 5 mg (5 mg Intravenous Given 01/16/15 1236)  acetaminophen (TYLENOL) tablet 650 mg (650 mg Oral Given 01/16/15 1229)  hydrALAZINE (APRESOLINE) injection 5 mg (5 mg Intravenous Given 01/16/15 1507)    New Prescriptions   ACETAMINOPHEN (TYLENOL) 500 MG TABLET    Take 1 tablet (500 mg total) by mouth every 6 (six) hours  as needed for mild pain or moderate pain.   HYDROCHLOROTHIAZIDE (HYDRODIURIL) 25 MG TABLET    Take 1 tablet (25 mg total) by mouth daily.    Final diagnoses:  Essential hypertension  Bad headache    This is a 64 y.o. female with a history of hypertension who presents to the ED complaining of right ear pain and a right-sided headache for the past 3 days. Patient reports a history of hypertension but has not been taking her blood pressure medicines for the past 3 years. Patient is a [facility] of the [facility]. She complains of an 8 out of 10 right frontal headache which is been constant for the past 2 days. On exam the patient is afebrile and nontoxic appearing. Her initial blood pressure is 244/100. She has no focal neurological deficits. We'll check blood work and give patient 5 mg  hydralazine for her blood pressure and tylenol. BMP is unremarkable. CBC is remarkable only for hemoglobin of 15.6.    14:45 at reevaluation the patient reports her headache is greatly improved. She reports still having a slight headache. She reports she ambulated to the bathroom without feeling lightheaded or dizzy. In the room she sits and stands for me without feeling lightheaded or dizzy. Blood pressure is 194/67 which is a great improvement. Will do one more dose of hydralazine and reevaluate. After second dose of hydralazine her blood pressure is 169/59. She denies feeling lightheaded or dizzy. She reports her headache has improved. We'll discharge with prescription for HCTZ 25 mg. Patient also spoke with case management who is arranging her follow-up with the [facility]. I provided the patient with strict return precautions. Patient reports feeling comfortable being discharged. I advised the patient to follow-up with their primary care provider this week. I advised the patient to return to the emergency department with new or worsening symptoms or new concerns. The patient verbalized understanding and agreement with plan.    This patient was discussed with Dr. Zenia Resides who agrees with assessment and plan.    Waynetta Pean, PA-C 01/16/15 [place], MD 01/19/15 [phone]

## 2015-01-25 ENCOUNTER — Encounter (HOSPITAL_COMMUNITY): Payer: Self-pay | Admitting: *Deleted

## 2015-01-25 ENCOUNTER — Emergency Department (HOSPITAL_COMMUNITY)
Admission: EM | Admit: 2015-01-25 | Discharge: 2015-01-25 | Disposition: A | Discharge status: Home / Self Care | Payer: Medicare Other | Attending: Emergency Medicine | Admitting: Emergency Medicine

## 2015-01-25 ENCOUNTER — Emergency Department (HOSPITAL_COMMUNITY)
Admission: EM | Admit: 2015-01-25 | Discharge: 2015-01-26 | Disposition: A | Discharge status: Home / Self Care | Payer: Medicare Other | Attending: Emergency Medicine | Admitting: Emergency Medicine

## 2015-01-25 ENCOUNTER — Encounter (HOSPITAL_COMMUNITY): Payer: Self-pay | Admitting: Family Medicine

## 2015-01-25 DIAGNOSIS — J3489 Other specified disorders of nose and nasal sinuses: Secondary | ICD-10-CM | POA: Insufficient documentation

## 2015-01-25 DIAGNOSIS — Z720 Tobacco use: Secondary | ICD-10-CM | POA: Insufficient documentation

## 2015-01-25 DIAGNOSIS — R001 Bradycardia, unspecified: Secondary | ICD-10-CM | POA: Insufficient documentation

## 2015-01-25 DIAGNOSIS — R51 Headache: Secondary | ICD-10-CM | POA: Insufficient documentation

## 2015-01-25 DIAGNOSIS — I160 Hypertensive urgency: Secondary | ICD-10-CM

## 2015-01-25 DIAGNOSIS — I10 Essential (primary) hypertension: Secondary | ICD-10-CM

## 2015-01-25 DIAGNOSIS — R42 Dizziness and giddiness: Secondary | ICD-10-CM | POA: Insufficient documentation

## 2015-01-25 DIAGNOSIS — E876 Hypokalemia: Secondary | ICD-10-CM

## 2015-01-25 DIAGNOSIS — Z79899 Other long term (current) drug therapy: Secondary | ICD-10-CM | POA: Insufficient documentation

## 2015-01-25 LAB — CBC WITH DIFFERENTIAL/PLATELET
BASOS PCT: 0 % (ref 0–1)
Basophils Absolute: 0 10*3/uL (ref 0.0–0.1)
Eosinophils Absolute: 0.1 10*3/uL (ref 0.0–0.7)
Eosinophils Relative: 1 % (ref 0–5)
HEMATOCRIT: 50.6 % — AB (ref 36.0–46.0)
Hemoglobin: 17.7 g/dL — ABNORMAL HIGH (ref 12.0–15.0)
LYMPHS PCT: 42 % (ref 12–46)
Lymphs Abs: 2.5 10*3/uL (ref 0.7–4.0)
MCH: 30.9 pg (ref 26.0–34.0)
MCHC: 35 g/dL (ref 30.0–36.0)
MCV: 88.3 fL (ref 78.0–100.0)
MONOS PCT: 7 % (ref 3–12)
Monocytes Absolute: 0.4 10*3/uL (ref 0.1–1.0)
Neutro Abs: 3.1 10*3/uL (ref 1.7–7.7)
Neutrophils Relative %: 50 % (ref 43–77)
Platelets: 254 10*3/uL (ref 150–400)
RBC: 5.73 MIL/uL — ABNORMAL HIGH (ref 3.87–5.11)
RDW: 13.3 % (ref 11.5–15.5)
WBC: 6.1 10*3/uL (ref 4.0–10.5)

## 2015-01-25 LAB — TROPONIN I

## 2015-01-25 LAB — BASIC METABOLIC PANEL
ANION GAP: 14 (ref 5–15)
BUN: 12 mg/dL (ref 6–20)
CO2: 28 mmol/L (ref 22–32)
Calcium: 10.5 mg/dL — ABNORMAL HIGH (ref 8.9–10.3)
Chloride: 95 mmol/L — ABNORMAL LOW (ref 101–111)
Creatinine, Ser: 0.92 mg/dL (ref 0.44–1.00)
GFR calc Af Amer: >60 mL/min (ref >60)
GFR calc non Af Amer: >60 mL/min (ref >60)
GLUCOSE: 168 mg/dL — AB (ref 65–99)
POTASSIUM: 2.7 mmol/L — AB (ref 3.5–5.1)
Sodium: 137 mmol/L (ref 135–145)

## 2015-01-25 MED ORDER — POTASSIUM CHLORIDE CRYS ER 20 MEQ PO TBCR
60.0000 meq | EXTENDED_RELEASE_TABLET | Freq: Once | ORAL | Status: AC
  Administered 2015-01-25: 60 meq via ORAL
  Filled 2015-01-25: qty 3

## 2015-01-25 MED ORDER — OXYCODONE-ACETAMINOPHEN 5-325 MG PO TABS
1.0000 | ORAL_TABLET | Freq: Once | ORAL | Status: AC
  Administered 2015-01-25: 1 via ORAL
  Filled 2015-01-25: qty 1

## 2015-01-25 MED ORDER — ACETAMINOPHEN 325 MG PO TABS
650.0000 mg | ORAL_TABLET | Freq: Once | ORAL | Status: AC
  Administered 2015-01-25: 650 mg via ORAL
  Filled 2015-01-25: qty 2

## 2015-01-25 MED ORDER — AMLODIPINE BESYLATE 5 MG PO TABS: 5.0000 mg | ORAL_TABLET | Freq: Every day | ORAL | Status: DC

## 2015-01-25 MED ORDER — AMLODIPINE BESYLATE 5 MG PO TABS
10.0000 mg | ORAL_TABLET | Freq: Once | ORAL | Status: AC
  Administered 2015-01-25: 10 mg via ORAL
  Filled 2015-01-25: qty 2

## 2015-01-25 MED ORDER — POTASSIUM CHLORIDE CRYS ER 20 MEQ PO TBCR: 20.0000 meq | EXTENDED_RELEASE_TABLET | Freq: Every day | ORAL | Status: DC

## 2015-01-25 NOTE — ED Notes (Signed)
Lab informed this nurse of Potassium 2.7, EDP Dr. Reather Converse notified.

## 2015-01-25 NOTE — ED Notes (Signed)
Pt is in stable condition upon d/c and ambulates from ED. 

## 2015-01-25 NOTE — ED Notes (Addendum)
Pt in c/o hypertension, states she has been seen here multiple times recently for same, they keep adjusting her medication, tonight c/o severe headache and pressure behind her eyes, seen earlier today and started on a new medication, pt has not taken anything for her BP today- pt just picked up her new prescription and is taking a dose on arrival.

## 2015-01-25 NOTE — ED Notes (Signed)
Pt here for hypertension, dizziness and weakness. St some spots in eyes. sts this has been happening since a week ago when she was seen here and given HTN medication. sts she has been taking it and BP has been fluctuating.

## 2015-01-25 NOTE — Discharge Instructions (Signed)
Follow-up closely with your doctor for repeat check of your potassium and blood pressure the end of this week.  If you were given medicines take as directed.  If you are on coumadin or contraceptives realize their levels and effectiveness is altered by many different medicines.  If you have any reaction (rash, tongues swelling, other) to the medicines stop taking and see a physician.    If your blood pressure was elevated in the ER make sure you follow up for management with a primary doctor or return for chest pain, shortness of breath or stroke symptoms.  Please follow up as directed and return to the ER or see a physician for new or worsening symptoms.  Thank you. Filed Vitals:   01/25/15 1000 01/25/15 1015 01/25/15 1030 01/25/15 1045  BP: 188/83 172/97 174/61 207/77  Pulse: 56 49 49 55  Temp:      TempSrc:      Resp: '19 20 15 14  '$ SpO2: 100% 98% 99% 99%    Hypertension Hypertension, commonly called high blood pressure, is when the force of blood pumping through your arteries is too strong. Your arteries are the blood vessels that carry blood from your heart throughout your body. A blood pressure reading consists of a higher number over a lower number, such as 110/72. The higher number (systolic) is the pressure inside your arteries when your heart pumps. The lower number (diastolic) is the pressure inside your arteries when your heart relaxes. Ideally you want your blood pressure below 120/80. Hypertension forces your heart to work harder to pump blood. Your arteries may become narrow or stiff. Having hypertension puts you at risk for heart disease, stroke, and other problems.  RISK FACTORS Some risk factors for high blood pressure are controllable. Others are not.  Risk factors you cannot control include:   Race. You may be at higher risk if you are African American.  Age. Risk increases with age.  Gender. Men are at higher risk than women before age 87 years. After age 55, women are  at higher risk than men. Risk factors you can control include:  Not getting enough exercise or physical activity.  Being overweight.  Getting too much fat, sugar, calories, or salt in your diet.  Drinking too much alcohol. SIGNS AND SYMPTOMS Hypertension does not usually cause signs or symptoms. Extremely high blood pressure (hypertensive crisis) may cause headache, anxiety, shortness of breath, and nosebleed. DIAGNOSIS  To check if you have hypertension, your health care provider will measure your blood pressure while you are seated, with your arm held at the level of your heart. It should be measured at least twice using the same arm. Certain conditions can cause a difference in blood pressure between your right and left arms. A blood pressure reading that is higher than normal on one occasion does not mean that you need treatment. If one blood pressure reading is high, ask your health care provider about having it checked again. TREATMENT  Treating high blood pressure includes making lifestyle changes and possibly taking medicine. Living a healthy lifestyle can help lower high blood pressure. You may need to change some of your habits. Lifestyle changes may include:  Following the DASH diet. This diet is high in fruits, vegetables, and whole grains. It is low in salt, red meat, and added sugars.  Getting at least 2 hours of brisk physical activity every week.  Losing weight if necessary.  Not smoking.  Limiting alcoholic beverages.  Learning ways to  reduce stress. If lifestyle changes are not enough to get your blood pressure under control, your health care provider may prescribe medicine. You may need to take more than one. Work closely with your health care provider to understand the risks and benefits. HOME CARE INSTRUCTIONS  Have your blood pressure rechecked as directed by your health care provider.   Take medicines only as directed by your health care provider. Follow  the directions carefully. Blood pressure medicines must be taken as prescribed. The medicine does not work as well when you skip doses. Skipping doses also puts you at risk for problems.   Do not smoke.   Monitor your blood pressure at home as directed by your health care provider. SEEK MEDICAL CARE IF:   You think you are having a reaction to medicines taken.  You have recurrent headaches or feel dizzy.  You have swelling in your ankles.  You have trouble with your vision. SEEK IMMEDIATE MEDICAL CARE IF:  You develop a severe headache or confusion.  You have unusual weakness, numbness, or feel faint.  You have severe chest or abdominal pain.  You vomit repeatedly.  You have trouble breathing. MAKE SURE YOU:   Understand these instructions.  Will watch your condition.  Will get help right away if you are not doing well or get worse. Document Released: 06/19/2005 Document Revised: 11/03/2013 Document Reviewed: 04/11/2013 [facility] Patient Information 2015 [place], [place]. This information is not intended to replace advice given to you by your health care provider. Make sure you discuss any questions you have with your health care provider.

## 2015-01-25 NOTE — ED Provider Notes (Signed)
CSN: [id number]     Arrival date & time 01/25/15  0908 History   First MD Initiated Contact with Patient 01/25/15 [phone]     Chief Complaint  Patient presents with  . Hypertension     (Consider location/radiation/quality/duration/timing/severity/associated sxs/prior Treatment) HPI Comments: 64 year old female with history of Graves' disease, tobacco abuse, kidney insufficiency presents with elevated blood pressure. Patient was seen recently and started on high blood pressure medications for which he takes regularly. Patient is taking hydrochlorothiazide daily. Patient has had mild intermittent dizziness and fatigue the past week with fluctuating blood pressures ranging from mid 100s to low 200s. Patient has outpatient follow-up for blood pressure. Patient has no chest pain or shortness of breath no severe headache. Patient has mild frontal headache gradual onset. Blood pressure is elevated or searches pain.  Patient is a 64 y.o. female presenting with hypertension. The history is provided by the patient.  Hypertension Associated symptoms include headaches. Pertinent negatives include no chest pain, no abdominal pain and no shortness of breath.    Past Medical History  Diagnosis Date  . Hypertension    History reviewed. No pertinent past surgical history. History reviewed. No pertinent family history. History  Substance Use Topics  . Smoking status: Current Every Day Smoker -- 0.30 packs/day    Types: Cigarettes  . Smokeless tobacco: Not on file  . Alcohol Use: No   OB History    No data available     Review of Systems  Constitutional: Negative for fever and chills.  HENT: Negative for congestion.   Eyes: Negative for visual disturbance.  Respiratory: Negative for shortness of breath.   Cardiovascular: Negative for chest pain.  Gastrointestinal: Negative for vomiting and abdominal pain.  Genitourinary: Negative for dysuria and flank pain.  Musculoskeletal: Negative for back  pain, neck pain and neck stiffness.  Skin: Negative for rash.  Neurological: Positive for dizziness and headaches. Negative for light-headedness.      Allergies  Codeine  Home Medications   Reviewed BP 187/68 mmHg  Pulse 52  Temp(Src) 97.8 F (36.6 C) (Oral)  Resp 11  SpO2 99% Physical Exam  Constitutional: She is oriented to person, place, and time. She appears well-developed and well-nourished.  HENT:  Head: Normocephalic and atraumatic.  Eyes: Conjunctivae are normal. Right eye exhibits no discharge. Left eye exhibits no discharge.  Neck: Normal range of motion. Neck supple. No tracheal deviation present.  Cardiovascular: Regular rhythm and intact distal pulses.  Bradycardia present.   Pulmonary/Chest: Effort normal and breath sounds normal.  Abdominal: Soft. She exhibits no distension. There is no tenderness. There is no guarding.  Musculoskeletal: She exhibits no edema.  Neurological: She is alert and oriented to person, place, and time. No cranial nerve deficit.  Skin: Skin is warm. No rash noted.  Psychiatric: She has a normal mood and affect.  Nursing note and vitals reviewed.   ED Course  Procedures (including critical care time) Labs Review Labs Reviewed  BASIC METABOLIC PANEL - Abnormal; Notable for the following:    Potassium 2.7 (*)    Chloride 95 (*)    Glucose, Bld 168 (*)    Calcium 10.5 (*)    All other components within normal limits  CBC WITH DIFFERENTIAL/PLATELET - Abnormal; Notable for the following:    RBC 5.73 (*)    Hemoglobin 17.7 (*)    HCT 50.6 (*)    All other components within normal limits  TROPONIN I    Imaging Review No results found.  EKG Interpretation   Date/Time:  Monday January 25 2015 09:56:01 EDT Ventricular Rate:  51 PR Interval:  167 QRS Duration: 84 QT Interval:  457 QTC Calculation: 421 R Axis:   77 Text Interpretation:  Sinus rhythm Ventricular premature complex Consider  left ventricular hypertrophy Similar  to previous, no recipricol depression  Confirmed by Asbury Hair  MD, Walther Sanagustin ([id number]) on 01/25/2015 10:25:19 AM      MDM   Final diagnoses:  Essential hypertension  Hypokalemia   Well-appearing patient with minimal symptoms along with elevated blood pressure. No clinical signs of endorgan damage, plan for screening blood work. Plan to start her back on amlodipine with close outpatient follow-up. Discussed risks of uncontrolled high blood pressure and strict reasons to return. EKG reviewed similar to previous, screening troponin pending. Patient has no symptoms on recheck. Discussed close follow up for repeat potassium, oral potassium given. Discussed taking 20 mEq of potassium each day until she sees her primary later this week and also to halve her hydrochlorothiazide dose and start taking amlodipine. Results and differential diagnosis were discussed with the patient/parent/guardian. Xrays were independently reviewed by myself.  Close follow up outpatient was discussed, comfortable with the plan.   Medications  amLODipine (NORVASC) tablet 10 mg (10 mg Oral Given 01/25/15 1012)  acetaminophen (TYLENOL) tablet 650 mg (650 mg Oral Given 01/25/15 1124)  potassium chloride SA (K-DUR,KLOR-CON) CR tablet 60 mEq (60 mEq Oral Given 01/25/15 1124)    Filed Vitals:   01/25/15 1015 01/25/15 1030 01/25/15 1045 01/25/15 1123  BP: 172/97 174/61 207/77 187/68  Pulse: 49 49 55 52  Temp:      TempSrc:      Resp: '20 15 14 11  '$ SpO2: 98% 99% 99% 99%    Final diagnoses:  Essential hypertension  Hypokalemia        Elnora Morrison, MD 01/25/15 1130

## 2015-01-25 NOTE — ED Notes (Signed)
[place], MD at bedside.  

## 2015-01-26 MED ORDER — OXYCODONE-ACETAMINOPHEN 5-325 MG PO TABS: 1.0000 | ORAL_TABLET | Freq: Three times a day (TID) | ORAL | Status: DC | PRN

## 2015-01-26 MED ORDER — TRIAMCINOLONE ACETONIDE 55 MCG/ACT NA AERO: 2.0000 | INHALATION_SPRAY | Freq: Every day | NASAL | Status: DC

## 2015-01-26 MED ORDER — PSEUDOEPHEDRINE HCL ER 120 MG PO TB12: 120.0000 mg | ORAL_TABLET | Freq: Two times a day (BID) | ORAL | Status: DC

## 2015-01-26 NOTE — ED Provider Notes (Signed)
CSN: [id number]     Arrival date & time 01/25/15  2112 History   First MD Initiated Contact with Patient 01/25/15 2324     Chief Complaint  Patient presents with  . Hypertension     HPI  Patient presents about 12 hours after being evaluated at this facility, now with concern for persistent hypertension, as well as ongoing sinus pressure. She states that for some time she has had right facial, head pain/pressure, including ear discomfort, radiating towards the neck. She states that this is more prominent recently. She denies new chest pain, dyspnea, new fever, chills, syncope, confusion. She states that since discharge earlier today, she only obtained her blood pressure medication in the past few hours, and has only taken one dose.   Past Medical History  Diagnosis Date  . Hypertension    History reviewed. No pertinent past surgical history. History reviewed. No pertinent family history. History  Substance Use Topics  . Smoking status: Current Every Day Smoker -- 0.30 packs/day    Types: Cigarettes  . Smokeless tobacco: Not on file  . Alcohol Use: No   OB History    No data available     Review of Systems  Constitutional:       Per HPI, otherwise negative  HENT:       Per HPI, otherwise negative  Respiratory:       Per HPI, otherwise negative  Cardiovascular:       Per HPI, otherwise negative  Gastrointestinal: Negative for vomiting.  Endocrine:       Negative aside from HPI  Genitourinary:       Neg aside from HPI   Musculoskeletal:       Per HPI, otherwise negative  Skin: Negative.   Neurological: Negative for syncope.      Allergies  Codeine  Home Medications   Prior to Admission medications   Medication Sig Start Date End Date Taking? Authorizing Provider  acetaminophen (TYLENOL) 500 MG tablet Take 1 tablet (500 mg total) by mouth every 6 (six) hours as needed for mild pain or moderate pain. 01/16/15   Waynetta Pean, PA-C  amLODipine (NORVASC) 10 MG  tablet TAKE ONE TABLET BY MOUTH ONCE DAILY NEEDS  OFFICE  VISIT  PRIOR  TO  ADDITIONAL  REFILLS Patient not taking: Reported on 01/16/2015 02/18/13   Melony Overly, MD  amLODipine (NORVASC) 5 MG tablet Take 1 tablet (5 mg total) by mouth daily. 01/25/15   Elnora Morrison, MD  carvedilol (COREG) 3.125 MG tablet TAKE ONE TABLET BY MOUTH TWICE DAILY FOR BLOOD PRESSURE Patient not taking: Reported on 01/16/2015 02/18/13   Melony Overly, MD  hydrochlorothiazide (HYDRODIURIL) 25 MG tablet Take 1 tablet (25 mg total) by mouth daily. 01/16/15   Waynetta Pean, PA-C  HYDROcodone-acetaminophen (NORCO/VICODIN) 5-325 MG per tablet Take 1 tablet by mouth every 6 (six) hours as needed for moderate pain.    Historical Provider, MD  hydrocortisone 2.5 % cream Apply topically 2 (two) times daily. Apply to legs 2 times a day. Dispense 30 grams Patient not taking: Reported on 01/16/2015 06/18/12   Melony Overly, MD  potassium chloride SA (K-DUR,KLOR-CON) 20 MEQ tablet Take 1 tablet (20 mEq total) by mouth daily. 01/25/15   Elnora Morrison, MD   BP 176/79 mmHg  Pulse 66  Temp(Src) 98.1 F (36.7 C) (Oral)  Resp 12  SpO2 99% Physical Exam  Constitutional: She is oriented to person, place, and time. She appears well-developed and well-nourished. No  distress.  HENT:  Head: Normocephalic and atraumatic.  Right Ear: Tympanic membrane, external ear and ear canal normal. No drainage, swelling or tenderness. No mastoid tenderness. Tympanic membrane is not injected. No middle ear effusion. No hemotympanum.  Mouth/Throat:    Eyes: Conjunctivae and EOM are normal.  Cardiovascular: Normal rate and regular rhythm.   Pulmonary/Chest: Effort normal and breath sounds normal. No stridor. No respiratory distress.  Abdominal: She exhibits no distension.  Musculoskeletal: She exhibits no edema.  Neurological: She is alert and oriented to person, place, and time. She displays no atrophy and no tremor. No cranial nerve deficit or sensory  deficit. She exhibits normal muscle tone. She displays no seizure activity. Coordination normal.  Skin: Skin is warm and dry.  Psychiatric: She has a normal mood and affect.  Nursing note and vitals reviewed.   ED Course  Procedures (including critical care time)   I saw the ECG, relevant labs and studies for the visit earlier today - notable for mild hypokalemia    Requests Percocet for her headache. We had a lengthy conversation about her likely sinusitis, as well as the need to continue taking her blood pressure medication as directed. Patient was also provided additional resources to ensure outpatient follow-up.  MDM  Patient presents with concern of persistent hypertension, as well as ongoing sinus congestion, head pressure. Patient is awake, alert, neurologically intact, hemodynamically stable. Labs conducted within the past 12 hours were all largely reassuring, aside from mild hypokalemia, which the patient received repletion. With these reassuring findings, the short timeframe prior to starting her medication, patient was discharged in stable condition with additional medication for her sinus congestion, to follow-up with primary care.   Carmin Muskrat, MD 01/26/15 [phone]

## 2015-01-26 NOTE — Discharge Instructions (Signed)
As discussed, it is important that you follow up as soon as possible with your physician for continued management of your condition. ° °If you develop any new, or concerning changes in your condition, please return to the emergency department immediately. ° °

## 2015-02-03 ENCOUNTER — Ambulatory Visit: Payer: Medicare Other | Attending: Family Medicine | Admitting: Family Medicine

## 2015-02-03 ENCOUNTER — Other Ambulatory Visit: Payer: Self-pay | Admitting: Family Medicine

## 2015-02-03 ENCOUNTER — Encounter: Payer: Self-pay | Admitting: Family Medicine

## 2015-02-03 VITALS — BP 130/70 | HR 86 | Temp 99.0°F | Ht 60.0 in | Wt 121.2 lb

## 2015-02-03 DIAGNOSIS — I10 Essential (primary) hypertension: Secondary | ICD-10-CM | POA: Insufficient documentation

## 2015-02-03 DIAGNOSIS — E0500 Thyrotoxicosis with diffuse goiter without thyrotoxic crisis or storm: Secondary | ICD-10-CM

## 2015-02-03 DIAGNOSIS — R531 Weakness: Secondary | ICD-10-CM | POA: Insufficient documentation

## 2015-02-03 DIAGNOSIS — M62838 Other muscle spasm: Secondary | ICD-10-CM | POA: Insufficient documentation

## 2015-02-03 DIAGNOSIS — F17210 Nicotine dependence, cigarettes, uncomplicated: Secondary | ICD-10-CM | POA: Insufficient documentation

## 2015-02-03 DIAGNOSIS — Z79899 Other long term (current) drug therapy: Secondary | ICD-10-CM | POA: Insufficient documentation

## 2015-02-03 LAB — BASIC METABOLIC PANEL
BUN: 11 mg/dL (ref 7–25)
CALCIUM: 10.5 mg/dL — AB (ref 8.6–10.4)
CHLORIDE: 99 mmol/L (ref 98–110)
CO2: 27 mmol/L (ref 20–31)
Creat: 0.87 mg/dL (ref 0.50–0.99)
Glucose, Bld: 107 mg/dL — ABNORMAL HIGH (ref 65–99)
POTASSIUM: 4.4 mmol/L (ref 3.5–5.3)
Sodium: 139 mmol/L (ref 135–146)

## 2015-02-03 MED ORDER — CYCLOBENZAPRINE HCL 10 MG PO TABS: 10.0000 mg | ORAL_TABLET | Freq: Two times a day (BID) | ORAL | Status: DC | PRN

## 2015-02-03 MED ORDER — CLONIDINE HCL 0.1 MG PO TABS
0.2000 mg | ORAL_TABLET | Freq: Once | ORAL | Status: AC
  Administered 2015-02-03: 0.2 mg via ORAL

## 2015-02-03 MED ORDER — CARVEDILOL 6.25 MG PO TABS: 6.2500 mg | ORAL_TABLET | Freq: Two times a day (BID) | ORAL | Status: DC

## 2015-02-03 MED ORDER — ASPIRIN 81 MG PO TABS: 81.0000 mg | ORAL_TABLET | Freq: Every day | ORAL | Status: DC

## 2015-02-03 NOTE — Progress Notes (Signed)
Lisa Taylor, is a 64 y.o. female  AST:[id number]  LGX:[id number]  DOB - 1951-02-07  CC:  Chief Complaint  Patient presents with  . Follow-up  . Hypertension       HPI: Lisa Taylor is a 64 y.o. female here today to establish medical care. She has a history of accelerated hypertension evidenced by her visits to the ED on 716/16, 01/25/15 and 01/26/15 where she was seen for ear pain, headaches and was found to have  blood pressures of 169/59, 187/68, 176/79.  She initially received antihypertensives which did not seem to bring down her blood pressure on subsequent visits. Was treated for sinus pressure but she tells me she never took the Sudafed,has been using the Flonase. She denies any facial pressure and nasal congestion but does have some right ear pain which radiates to her neck and jaw bone with right shoulder pain which is felt small on the posterior aspect and has intermittent left arm weakness which is absent at this time. Denies numbness and tingling.  Admits to being compliant with her antihypertensive and she has a blood pressure log from home which reveals blood pressures in the 814-481 range systolic with a single value of 856 systolic however her blood pressure in the clinic is 227/99. She denies any chest pains, shortness of breath; she does have a previous history of Graves' disease but is not on medications.  Allergies  Allergen Reactions  . Codeine Other (See Comments)    Nausea/vomitting; some dizziness   Past Medical History  Diagnosis Date  . Hypertension    Current Outpatient Prescriptions on File Prior to Visit  Medication Sig Dispense Refill  . amLODipine (NORVASC) 5 MG tablet Take 1 tablet (5 mg total) by mouth daily. 30 tablet 1  . hydrocortisone 2.5 % cream Apply topically 2 (two) times daily. Apply to legs 2 times a day. Dispense 30 grams (Patient not taking: Reported on 01/16/2015) 30 g 3  . potassium chloride SA (K-DUR,KLOR-CON) 20 MEQ tablet Take 1  tablet (20 mEq total) by mouth daily. (Patient not taking: Reported on 02/03/2015) 10 tablet 0  . triamcinolone (NASACORT AQ) 55 MCG/ACT AERO nasal inhaler Place 2 sprays into the nose daily. 1 Inhaler 0   No current facility-administered medications on file prior to visit.   History reviewed. No pertinent family history. History   Social History  . Marital Status: Married    Spouse Name: N/A  . Number of Children: N/A  . Years of Education: N/A   Occupational History  . Not on file.   Social History Main Topics  . Smoking status: Current Every Day Smoker -- 0.25 packs/day    Types: Cigarettes  . Smokeless tobacco: Not on file  . Alcohol Use: No  . Drug Use: No  . Sexual Activity: Not on file   Other Topics Concern  . Not on file   Social History Narrative    Review of Systems: Constitutional: Negative for fever, chills, diaphoresis, activity change, appetite change and fatigue. HENT: see hpi  Eyes: Negative for pain, discharge, redness, itching and visual disturbance. Respiratory: Negative for cough, choking, chest tightness, shortness of breath, wheezing and stridor.  Cardiovascular: Negative for chest pain, palpitations and leg swelling. Gastrointestinal: Negative for abdominal distention. Genitourinary: Negative for dysuria, urgency, frequency, hematuria, flank pain, decreased urine volume, difficulty urinating and dyspareunia.  Musculoskeletal: Negative for back pain, joint swelling, arthralgia and gait problem, right shoulder pain. Neurological: Negative for dizziness, tremors, seizures, syncope, facial asymmetry,  speech difficulty, weakness, light-headedness, numbness, positive for headaches.  Hematological: Negative for adenopathy. Does not bruise/bleed easily. Skin: Negative for rash, ulcer. Psychiatric/Behavioral: Negative for hallucinations, behavioral problems, confusion, dysphoric mood, decreased concentration and agitation.    Objective:  Filed Vitals:    02/03/15 1134 02/03/15 1135 02/03/15 1230  BP: 227/99 220/80 130/70  Pulse: 86    Temp: 99 F (37.2 C)    Height: 5' (1.524 m)    Weight: 121 lb 3.2 oz (54.976 kg)    SpO2: 98%          Physical Exam: Constitutional: Patient appears well-developed and well-nourished. No distress. HENT: Normocephalic, atraumatic, External right and left ear normal. Oropharynx is clear and moist.  Eyes: Conjunctivae and EOM are normal. PERRLA, no scleral icterus. Neck: Normal ROM, No JVD. No tracheal deviation. No thyromegaly. CVS: RRR, S1/S2 +, no murmurs, no gallops, no carotid bruit.  Pulmonary: Effort and breath sounds normal, no stridor, rhonchi, wheezes, rales.  Abdominal: Soft. BS +, no distension, tenderness, rebound or guarding.  Musculoskeletal: Normal range of motion. No edema and no tenderness.  Lymphadenopathy: No lymphadenopathy noted, cervical, inguinal or axillary Neuro: Alert. Normal reflexes, muscle tone coordination. No cranial nerve deficit. Skin: Skin is warm and dry. No rash noted. Not diaphoretic. No erythema. No pallor. Psychiatric: Normal mood and affect. Behavior, judgment, thought content normal.  Lab Results  Component Value Date   WBC 6.1 01/25/2015   HGB 17.7* 01/25/2015   HCT 50.6* 01/25/2015   MCV 88.3 01/25/2015   PLT 254 01/25/2015   Lab Results  Component Value Date   CREATININE 0.92 01/25/2015   BUN 12 01/25/2015   NA 137 01/25/2015   K 2.7* 01/25/2015   CL 95* 01/25/2015   CO2 28 01/25/2015    No results found for: HGBA1C Lipid Panel     Component Value Date/Time   CHOL 247* 09/15/2011 1214   TRIG 70 09/15/2011 1214   HDL 72 09/15/2011 1214   CHOLHDL 3.4 09/15/2011 1214   VLDL 14 09/15/2011 1214   LDLCALC 161* 09/15/2011 1214       Assessment and plan:   63 year old female with a history of accelerated hypertension, previous history of Graves' disease here to establish care blood pressure is severely elevated.    Accelerated hypertension:    Severe discrepancy between home blood pressure logs and blood pressure in the clinic  clonidine 0.2 mg given to patient and she was observed in the clinic and a repeat blood pressure taken after 1 hour improved to 1:30/70.  Increased dose of Coreg continue current dose of amlodipine; I will send off a thyroid panel as well as metanephrine to evaluate for secondary causes of hypertension. Reassess blood pressure at the next visit and will increase dose of amlodipine and if still elevated. Advised to maintain low-sodium diet-diet as well as lifestyle changes. Placed on aspirin in the event that she could have some symptoms of TIA given the left hand weakness she occasionally has.  Muscle spasm: Right posterior shoulder pain secondary to trapezius muscle spasm and so I have placed her on Flexeril and educated on side effects.     The patient was given clear instructions to go to ER or return to medical center if symptoms don't improve, worsen or new problems develop. The patient verbalized understanding. The patient was told to call to get lab results if they haven't heard anything in the next week.     Arnoldo Morale, MD. [facility]  [phone] 02/03/2015, 12:16 PM

## 2015-02-03 NOTE — Addendum Note (Signed)
Addended byBrooke Bonito on: 02/03/2015 04:53 PM   Modules accepted: Orders

## 2015-02-03 NOTE — Progress Notes (Signed)
Follow up for hypertensive urgency Blood pressure today is 227/99 and pulse is 86 Complains of episodes of right ear pain that radiates to the back of her head and after episode her arm is weak Complains of shoulder pain and stiffness 7/10

## 2015-02-03 NOTE — Patient Instructions (Signed)

## 2015-02-05 LAB — THYROID PANEL WITH TSH
FREE THYROXINE INDEX: 2.3 (ref 1.4–3.8)
T3 Uptake: 29 % (ref 22–35)
T4 TOTAL: 7.9 ug/dL (ref 4.5–12.0)
TSH: 1.256 u[IU]/mL (ref 0.350–4.500)

## 2015-02-06 LAB — METANEPHRINES, PLASMA
Metanephrine, Free: 88 pg/mL — ABNORMAL HIGH (ref <=57)
NORMETANEPHRINE FREE: 328 pg/mL — AB (ref <=148)
Total Metanephrines-Plasma: 416 pg/mL — ABNORMAL HIGH (ref <=205)

## 2015-02-08 ENCOUNTER — Telehealth: Payer: Self-pay | Admitting: *Deleted

## 2015-02-08 NOTE — Telephone Encounter (Signed)
-----   Message from Arnoldo Morale, MD sent at 02/05/2015  4:59 PM EDT ----- Please inform the patient that labs are normal. Thank you.

## 2015-02-08 NOTE — Telephone Encounter (Signed)
Verified  Name and date of birth and gave normal lab results.

## 2015-02-10 ENCOUNTER — Ambulatory Visit: Payer: Medicare Other | Attending: Family Medicine | Admitting: Family Medicine

## 2015-02-10 ENCOUNTER — Encounter: Payer: Self-pay | Admitting: Family Medicine

## 2015-02-10 VITALS — BP 160/75 | HR 85 | Temp 98.0°F | Ht 61.0 in | Wt 129.0 lb

## 2015-02-10 DIAGNOSIS — I10 Essential (primary) hypertension: Secondary | ICD-10-CM | POA: Insufficient documentation

## 2015-02-10 DIAGNOSIS — R7989 Other specified abnormal findings of blood chemistry: Secondary | ICD-10-CM | POA: Insufficient documentation

## 2015-02-10 DIAGNOSIS — R799 Abnormal finding of blood chemistry, unspecified: Secondary | ICD-10-CM | POA: Diagnosis not present

## 2015-02-10 DIAGNOSIS — Z7982 Long term (current) use of aspirin: Secondary | ICD-10-CM | POA: Insufficient documentation

## 2015-02-10 DIAGNOSIS — Z79899 Other long term (current) drug therapy: Secondary | ICD-10-CM | POA: Insufficient documentation

## 2015-02-10 DIAGNOSIS — F17210 Nicotine dependence, cigarettes, uncomplicated: Secondary | ICD-10-CM | POA: Insufficient documentation

## 2015-02-10 NOTE — Patient Instructions (Signed)

## 2015-02-10 NOTE — Progress Notes (Signed)
Subjective:    Patient ID: Lisa Taylor, female    DOB: 1951-01-12, 64 y.o.   MRN: [id number]  HPI Lisa Taylor is a 64 year old female with a history of accelerated hypertension who comes in for follow-up on her blood pressure after increasing the dose of carvedilol at her last office visit. She has her blood  Pressure log which reveals systolic blood sugars should pressures in the 130s and she has been compliant with both carvedilol and amlodipine. She had labs at her last office visit to evaluate for secondary causes of accelerated hypertension which revealed elevated metanephrines:free plasma metanephrine was elevated at 88, free normetanephrine elevated at 328, total plasma metanephrines elevated at 416.  She has no complaints today.  Past Medical History  Diagnosis Date  . Hypertension     History reviewed. No pertinent past surgical history.  Social History   Social History  . Marital Status: Married    Spouse Name: N/A  . Number of Children: N/A  . Years of Education: N/A   Occupational History  . Not on file.   Social History Main Topics  . Smoking status: Current Every Day Smoker -- 0.25 packs/day    Types: Cigarettes  . Smokeless tobacco: Not on file  . Alcohol Use: No  . Drug Use: No  . Sexual Activity: Not on file   Other Topics Concern  . Not on file   Social History Narrative    Allergies  Allergen Reactions  . Codeine Other (See Comments)    Nausea/vomitting; some dizziness    Current Outpatient Prescriptions on File Prior to Visit  Medication Sig Dispense Refill  . amLODipine (NORVASC) 5 MG tablet Take 1 tablet (5 mg total) by mouth daily. 30 tablet 1  . aspirin 81 MG tablet Take 1 tablet (81 mg total) by mouth daily. 30 tablet 2  . carvedilol (COREG) 6.25 MG tablet Take 1 tablet (6.25 mg total) by mouth 2 (two) times daily with a meal. 60 tablet 2  . cyclobenzaprine (FLEXERIL) 10 MG tablet Take 1 tablet (10 mg total) by mouth 2 (two) times  daily as needed for muscle spasms. (Patient not taking: Reported on 02/10/2015) 30 tablet 1  . hydrocortisone 2.5 % cream Apply topically 2 (two) times daily. Apply to legs 2 times a day. Dispense 30 grams (Patient not taking: Reported on 02/10/2015) 30 g 3  . potassium chloride SA (K-DUR,KLOR-CON) 20 MEQ tablet Take 1 tablet (20 mEq total) by mouth daily. (Patient not taking: Reported on 02/10/2015) 10 tablet 0  . triamcinolone (NASACORT AQ) 55 MCG/ACT AERO nasal inhaler Place 2 sprays into the nose daily. 1 Inhaler 0   No current facility-administered medications on file prior to visit.      Review of Systems  Constitutional: Negative for activity change and appetite change.  HENT: Negative for sinus pressure and sore throat.   Respiratory: Negative for chest tightness, shortness of breath and wheezing.   Gastrointestinal: Negative for abdominal pain, constipation and abdominal distention.  Genitourinary: Negative.   Musculoskeletal: Negative.   Psychiatric/Behavioral: Negative for behavioral problems and dysphoric mood.         Objective:\ Filed Vitals:   02/10/15 1036  BP: 160/75  Pulse: 85  Temp: 98 F (36.7 C)  Height: '5\' 1"'$  (1.549 m)  Weight: 129 lb (58.514 kg)  SpO2: 98%      Physical Exam  Constitutional: She is oriented to person, place, and time. She appears well-developed and well-nourished.  Cardiovascular: Normal rate,  normal heart sounds and intact distal pulses.   No murmur heard. Pulmonary/Chest: Effort normal and breath sounds normal. She has no wheezes. She has no rales. She exhibits no tenderness.  Abdominal: Soft. Bowel sounds are normal. She exhibits no distension and no mass. There is no tenderness.  Musculoskeletal: Normal range of motion.  Neurological: She is alert and oriented to person, place, and time.             Assessment & Plan:  Hypertension:  Home blood pressure log reveals blood pressure is controlled however her blood pressure is  still elevated in the clinic.  I will hold off on any regimen changes at this time she will need to implement lifestyle changes.  Referred for renal artery Doppler to exclude renal artery stenosis.   In the setting of elevated plasma metanephrines I would love to order a CT abdomen and pelvis without contrast to exclude presence of a pheohromocytoma  however this is not covered by Medicare and so I will hold off for now as levels are in the indeterminate range. Low sodium, DASH diet.

## 2015-02-10 NOTE — Progress Notes (Signed)
F/u HTN Patient has been taking her increased dose of Coreg and has continued her Amlopdopine She is still smoking several cigarettes per day She states her head and ear pain are gone Her pressure manually was 160/75

## 2015-02-11 ENCOUNTER — Encounter: Payer: Self-pay | Admitting: *Deleted

## 2015-02-11 ENCOUNTER — Ambulatory Visit (HOSPITAL_COMMUNITY)
Admission: RE | Admit: 2015-02-11 | Discharge: 2015-02-11 | Disposition: A | Discharge status: Home / Self Care | Payer: Medicare Other | Source: Ambulatory Visit | Attending: Family Medicine | Admitting: Family Medicine

## 2015-02-11 DIAGNOSIS — I10 Essential (primary) hypertension: Secondary | ICD-10-CM | POA: Diagnosis not present

## 2015-02-11 NOTE — Progress Notes (Signed)
*  PRELIMINARY RESULTS* Vascular Ultrasound Renal Artery Duplex has been completed.  Findings suggest 1-59% renal artery stenosis bilaterally.   02/11/2015 9:44 AM Maudry Mayhew, RVT, RDCS, RDMS

## 2015-02-18 ENCOUNTER — Telehealth: Payer: Self-pay | Admitting: *Deleted

## 2015-02-18 NOTE — Telephone Encounter (Signed)
Left HIPAA compliant message for patient to return my call at [phone]

## 2015-02-18 NOTE — Telephone Encounter (Signed)
-----   Message from Arnoldo Morale, MD sent at 02/16/2015  4:17 PM EDT ----- Renal artery Doppler is negative for renal artery stenosis which could be a secondary cause of hypertension.

## 2015-03-01 NOTE — Telephone Encounter (Signed)
Error

## 2015-03-12 ENCOUNTER — Ambulatory Visit: Payer: Medicare Other | Attending: Family Medicine | Admitting: Family Medicine

## 2015-03-12 ENCOUNTER — Encounter: Payer: Self-pay | Admitting: Family Medicine

## 2015-03-12 VITALS — BP 177/69 | HR 57 | Temp 98.1°F | Resp 18 | Ht 60.0 in | Wt 124.0 lb

## 2015-03-12 DIAGNOSIS — I10 Essential (primary) hypertension: Secondary | ICD-10-CM | POA: Insufficient documentation

## 2015-03-12 DIAGNOSIS — M25512 Pain in left shoulder: Secondary | ICD-10-CM | POA: Insufficient documentation

## 2015-03-12 DIAGNOSIS — M25519 Pain in unspecified shoulder: Secondary | ICD-10-CM | POA: Insufficient documentation

## 2015-03-12 MED ORDER — AMLODIPINE BESYLATE 10 MG PO TABS: 10.0000 mg | ORAL_TABLET | Freq: Every day | ORAL | Status: DC

## 2015-03-12 MED ORDER — CAPSAICIN-MENTHOL-METHYL SAL 0.025-1-12 % EX CREA: 1.0000 "application " | TOPICAL_CREAM | Freq: Two times a day (BID) | CUTANEOUS | Status: DC | PRN

## 2015-03-12 NOTE — Patient Instructions (Signed)

## 2015-03-12 NOTE — Progress Notes (Signed)
CC:  HPI: Lisa Taylor is a 64 y.o. female with a history of HTN here today for a follow up visit. Her blood pressure remains elevated despite compliance with amlodipine and carvedilol but she attributes to the patient to currently being stressed about her Social Security income. She brings in her blood sugar log which reveals systolic blood sugars of 478-295.  She is requesting a refill of the topical Capsaicin cream which she applies to her left shoulder. Complains of chronic left shoulder pain 8/10 since her surgery years ago.   Patient has No headache, No chest pain, No abdominal pain - No Nausea, No new weakness tingling or numbness, No Cough - SOB.  Allergies  Allergen Reactions  . Codeine Other (See Comments)    Nausea/vomitting; some dizziness   Past Medical History  Diagnosis Date  . Hypertension    Current Outpatient Prescriptions on File Prior to Visit  Medication Sig Dispense Refill  . amLODipine (NORVASC) 5 MG tablet Take 1 tablet (5 mg total) by mouth daily. 30 tablet 1  . aspirin 81 MG tablet Take 1 tablet (81 mg total) by mouth daily. 30 tablet 2  . carvedilol (COREG) 6.25 MG tablet Take 1 tablet (6.25 mg total) by mouth 2 (two) times daily with a meal. 60 tablet 2  . cyclobenzaprine (FLEXERIL) 10 MG tablet Take 1 tablet (10 mg total) by mouth 2 (two) times daily as needed for muscle spasms. 30 tablet 1  . hydrocortisone 2.5 % cream Apply topically 2 (two) times daily. Apply to legs 2 times a day. Dispense 30 grams (Patient not taking: Reported on 02/10/2015) 30 g 3  . potassium chloride SA (K-DUR,KLOR-CON) 20 MEQ tablet Take 1 tablet (20 mEq total) by mouth daily. (Patient not taking: Reported on 03/12/2015) 10 tablet 0  . triamcinolone (NASACORT AQ) 55 MCG/ACT AERO nasal inhaler Place 2 sprays into the nose daily. 1 Inhaler 0   No current facility-administered medications on file prior to visit.   History reviewed. No pertinent family history. Social History     Social History  . Marital Status: Married    Spouse Name: N/A  . Number of Children: N/A  . Years of Education: N/A   Occupational History  . Not on file.   Social History Main Topics  . Smoking status: Current Every Day Smoker -- 0.25 packs/day    Types: Cigarettes  . Smokeless tobacco: Not on file  . Alcohol Use: Yes     Comment: occassional  . Drug Use: Yes    Special: Marijuana     Comment: september 4  . Sexual Activity: Not on file   Other Topics Concern  . Not on file   Social History Narrative    Review of Systems: Constitutional: Negative for fever, chills, diaphoresis, activity change, appetite change and fatigue. HENT: Negative for ear pain, nosebleeds, congestion, facial swelling, rhinorrhea, neck pain, neck stiffness and ear discharge.  Eyes: Negative for pain, discharge, redness, itching and visual disturbance. Respiratory: Negative for cough, choking, chest tightness, shortness of breath, wheezing and stridor.  Cardiovascular: Negative for chest pain, palpitations and leg swelling. Gastrointestinal: Negative for abdominal distention. Genitourinary: Negative for dysuria, urgency, frequency, hematuria, flank pain, decreased urine volume, difficulty urinating and dyspareunia.  Musculoskeletal: + L shoulder pain Neurological: Negative for dizziness, tremors, seizures, syncope, facial asymmetry, speech difficulty, weakness, light-headedness, numbness and headaches.  Hematological: Negative for adenopathy. Does not bruise/bleed easily. Psychiatric/Behavioral: Negative for hallucinations, behavioral problems, confusion, dysphoric mood, decreased concentration  and agitation.    Objective:   Filed Vitals:   03/12/15 1018  BP: 177/69  Pulse: 57  Temp: 98.1 F (36.7 C)  Resp: 18    Physical Exam: Constitutional: Patient appears well-developed and well-nourished. No distress. Neck: Normal ROM. Neck supple. No JVD. No tracheal deviation. No  thyromegaly. CVS: RRR, S1/S2 +, no murmurs, no gallops, no carotid bruit.  Pulmonary: Effort and breath sounds normal, no stridor, rhonchi, wheezes, rales.  Abdominal: Soft. BS +,  no distension, tenderness, rebound or guarding.  Musculoskeletal: L shoulder ROM limited; forward elevation limited to 70 degrees.R shoulder is normal Lymphadenopathy: No lymphadenopathy noted, cervical, inguinal or axillary Neuro: Alert. Normal reflexes, muscle tone coordination. No cranial nerve deficit. Skin: Skin is warm and dry. No rash noted. Not diaphoretic. No erythema. No pallor. Psychiatric: Normal mood and affect. Behavior, judgment, thought content normal.  Lab Results  Component Value Date   WBC 6.1 01/25/2015   HGB 17.7* 01/25/2015   HCT 50.6* 01/25/2015   MCV 88.3 01/25/2015   PLT 254 01/25/2015   Lab Results  Component Value Date   CREATININE 0.87 02/03/2015   BUN 11 02/03/2015   NA 139 02/03/2015   K 4.4 02/03/2015   CL 99 02/03/2015   CO2 27 02/03/2015    No results found for: HGBA1C Lipid Panel     Component Value Date/Time   CHOL 247* 09/15/2011 1214   TRIG 70 09/15/2011 1214   HDL 72 09/15/2011 1214   CHOLHDL 3.4 09/15/2011 1214   VLDL 14 09/15/2011 1214   LDLCALC 161* 09/15/2011 1214       Assessment and plan:  64 year old female with uncontrolled hypertension and chronic left shoulder pain.  Essential hypertension: Uncontrolled. Amlodipine raised from 5 mg to 10 mg. Low-sodium, DASH diet Lipid panel ordered.  Left shoulder pain: Status post surgery. Refilled Capsaicin as per patient request.  Arnoldo Morale, [facility] [phone] 03/12/2015, 10:55 AM

## 2015-03-12 NOTE — Progress Notes (Signed)
Patient here for HTN  F/U.  Patient has left shoulder pain, scaled at an 8, described as an aching and throbbing pain. Patient states pain has been present since shoulder surgery in 1993. 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 Patient states she can only left it half way over her head. Patient uses Arthritis cream on shoulder. Would like a refill or prescription on cream if possible.  Patient has taken her medications this morning.

## 2015-03-16 ENCOUNTER — Other Ambulatory Visit (HOSPITAL_COMMUNITY)
Admission: AD | Admit: 2015-03-16 | Discharge: 2015-03-16 | Disposition: A | Discharge status: Home / Self Care | Payer: Medicare Other | Source: Ambulatory Visit | Attending: Surgery | Admitting: Surgery

## 2015-03-16 DIAGNOSIS — I10 Essential (primary) hypertension: Secondary | ICD-10-CM | POA: Insufficient documentation

## 2015-03-16 LAB — LIPID PANEL
CHOL/HDL RATIO: 2.9 ratio
Cholesterol: 223 mg/dL — ABNORMAL HIGH (ref 0–200)
HDL: 77 mg/dL (ref >40)
LDL Cholesterol: 136 mg/dL — ABNORMAL HIGH (ref 0–99)
Triglycerides: 50 mg/dL (ref <150)
VLDL: 10 mg/dL (ref 0–40)

## 2015-04-14 ENCOUNTER — Ambulatory Visit: Payer: PPO | Attending: Family Medicine | Admitting: Family Medicine

## 2015-04-14 ENCOUNTER — Encounter: Payer: Self-pay | Admitting: Family Medicine

## 2015-04-14 VITALS — BP 176/150 | HR 83 | Temp 97.7°F | Resp 18 | Ht 61.0 in | Wt 131.0 lb

## 2015-04-14 DIAGNOSIS — M542 Cervicalgia: Secondary | ICD-10-CM

## 2015-04-14 DIAGNOSIS — F17200 Nicotine dependence, unspecified, uncomplicated: Secondary | ICD-10-CM

## 2015-04-14 DIAGNOSIS — I10 Essential (primary) hypertension: Secondary | ICD-10-CM

## 2015-04-14 DIAGNOSIS — E785 Hyperlipidemia, unspecified: Secondary | ICD-10-CM | POA: Diagnosis not present

## 2015-04-14 MED ORDER — TRAMADOL HCL 50 MG PO TABS: 50.0000 mg | ORAL_TABLET | Freq: Two times a day (BID) | ORAL | Status: DC | PRN

## 2015-04-14 MED ORDER — CARVEDILOL 12.5 MG PO TABS: 12.5000 mg | ORAL_TABLET | Freq: Two times a day (BID) | ORAL | Status: DC

## 2015-04-14 MED ORDER — ATORVASTATIN CALCIUM 20 MG PO TABS: 20.0000 mg | ORAL_TABLET | Freq: Every day | ORAL | Status: DC

## 2015-04-14 MED ORDER — CYCLOBENZAPRINE HCL 10 MG PO TABS: 10.0000 mg | ORAL_TABLET | Freq: Two times a day (BID) | ORAL | Status: DC | PRN

## 2015-04-14 NOTE — Patient Instructions (Signed)
Hypertension Hypertension, commonly called high blood pressure, is when the force of blood pumping through your arteries is too strong. Your arteries are the blood vessels that carry blood from your heart throughout your body. A blood pressure reading consists of a higher number over a lower number, such as 110/72. The higher number (systolic) is the pressure inside your arteries when your heart pumps. The lower number (diastolic) is the pressure inside your arteries when your heart relaxes. Ideally you want your blood pressure below 120/80. Hypertension forces your heart to work harder to pump blood. Your arteries may become narrow or stiff. Having untreated or uncontrolled hypertension can cause heart attack, stroke, kidney disease, and other problems. RISK FACTORS Some risk factors for high blood pressure are controllable. Others are not.  Risk factors you cannot control include:   Race. You may be at higher risk if you are African American.  Age. Risk increases with age.  Gender. Men are at higher risk than women before age 45 years. After age 65, women are at higher risk than men. Risk factors you can control include:  Not getting enough exercise or physical activity.  Being overweight.  Getting too much fat, sugar, calories, or salt in your diet.  Drinking too much alcohol. SIGNS AND SYMPTOMS Hypertension does not usually cause signs or symptoms. Extremely high blood pressure (hypertensive crisis) may cause headache, anxiety, shortness of breath, and nosebleed. DIAGNOSIS To check if you have hypertension, your health care provider will measure your blood pressure while you are seated, with your arm held at the level of your heart. It should be measured at least twice using the same arm. Certain conditions can cause a difference in blood pressure between your right and left arms. A blood pressure reading that is higher than normal on one occasion does not mean that you need treatment. If  it is not clear whether you have high blood pressure, you may be asked to return on a different day to have your blood pressure checked again. Or, you may be asked to monitor your blood pressure at home for 1 or more weeks. TREATMENT Treating high blood pressure includes making lifestyle changes and possibly taking medicine. Living a healthy lifestyle can help lower high blood pressure. You may need to change some of your habits. Lifestyle changes may include:  Following the DASH diet. This diet is high in fruits, vegetables, and whole grains. It is low in salt, red meat, and added sugars.  Keep your sodium intake below 2,300 mg per day.  Getting at least 30-45 minutes of aerobic exercise at least 4 times per week.  Losing weight if necessary.  Not smoking.  Limiting alcoholic beverages.  Learning ways to reduce stress. Your health care provider may prescribe medicine if lifestyle changes are not enough to get your blood pressure under control, and if one of the following is true:  You are 18-59 years of age and your systolic blood pressure is above 140.  You are 60 years of age or older, and your systolic blood pressure is above 150.  Your diastolic blood pressure is above 90.  You have diabetes, and your systolic blood pressure is over 140 or your diastolic blood pressure is over 90.  You have kidney disease and your blood pressure is above 140/90.  You have heart disease and your blood pressure is above 140/90. Your personal target blood pressure may vary depending on your medical conditions, your age, and other factors. HOME CARE INSTRUCTIONS    Have your blood pressure rechecked as directed by your health care provider.   Take medicines only as directed by your health care provider. Follow the directions carefully. Blood pressure medicines must be taken as prescribed. The medicine does not work as well when you skip doses. Skipping doses also puts you at risk for  problems.  Do not smoke.   Monitor your blood pressure at home as directed by your health care provider. SEEK MEDICAL CARE IF:   You think you are having a reaction to medicines taken.  You have recurrent headaches or feel dizzy.  You have swelling in your ankles.  You have trouble with your vision. SEEK IMMEDIATE MEDICAL CARE IF:  You develop a severe headache or confusion.  You have unusual weakness, numbness, or feel faint.  You have severe chest or abdominal pain.  You vomit repeatedly.  You have trouble breathing. MAKE SURE YOU:   Understand these instructions.  Will watch your condition.  Will get help right away if you are not doing well or get worse.   This information is not intended to replace advice given to you by your health care provider. Make sure you discuss any questions you have with your health care provider.   Document Released: 06/19/2005 Document Revised: 11/03/2014 Document Reviewed: 04/11/2013 Elsevier Interactive Patient Education 2016 [employer]  

## 2015-04-14 NOTE — Progress Notes (Signed)
Pt's here for f/up HTN. Pt reports pain in neck in between shoulder blades x2days rated pain at 7, with constant pain. Pt needs rx of flexeril. Pt reports taking her med.

## 2015-04-14 NOTE — Progress Notes (Signed)
Subjective:    Patient ID: Lisa Taylor, female    DOB: Aug 09, 1950, 64 y.o.   MRN: [id number]  HPI Lisa Taylor is a 64 year old female with a history of hypertension, hyperlipidemia, tobacco abuse whose blood pressure has remained uncontrolled despite medication changes. Her previous labs revealed elevated plasma metanephrines in the indeterminate range and renal artery Dopplers was negative for renal artery stenosis.   Her home blood pressure log reveals systolics in the 277A to 128N. She admits compliance with medications, low-sodium diet but continues to smoke and is not planning to quit anytime soon. Most recent labs revealed elevated lipids and she is not on a statin.  She complains of right-sided neck pain which has been on for the last few days and is not related to trauma. Pain is a 7/10.  Past Medical History  Diagnosis Date  . Hypertension     History reviewed. No pertinent past surgical history.  Social History   Social History  . Marital Status: Married    Spouse Name: N/A  . Number of Children: N/A  . Years of Education: N/A   Occupational History  . Not on file.   Social History Main Topics  . Smoking status: Current Every Day Smoker -- 0.25 packs/day    Types: Cigarettes  . Smokeless tobacco: Not on file  . Alcohol Use: Yes     Comment: occassional  . Drug Use: Yes    Special: Marijuana     Comment: september 4  . Sexual Activity: Yes   Other Topics Concern  . Not on file   Social History Narrative    Allergies  Allergen Reactions  . Codeine Other (See Comments)    Nausea/vomitting; some dizziness    Current Outpatient Prescriptions on File Prior to Visit  Medication Sig Dispense Refill  . amLODipine (NORVASC) 10 MG tablet Take 1 tablet (10 mg total) by mouth daily. 30 tablet 2  . aspirin 81 MG tablet Take 1 tablet (81 mg total) by mouth daily. 30 tablet 2  . cyclobenzaprine (FLEXERIL) 10 MG tablet Take 1 tablet (10 mg total) by  mouth 2 (two) times daily as needed for muscle spasms. 30 tablet 1  . hydrocortisone 2.5 % cream Apply topically 2 (two) times daily. Apply to legs 2 times a day. Dispense 30 grams (Patient not taking: Reported on 02/10/2015) 30 g 3  . triamcinolone (NASACORT AQ) 55 MCG/ACT AERO nasal inhaler Place 2 sprays into the nose daily. 1 Inhaler 0   No current facility-administered medications on file prior to visit.      Review of Systems  Constitutional: Negative for activity change, appetite change and fatigue.  HENT: Negative for congestion, sinus pressure and sore throat.   Eyes: Negative for visual disturbance.  Respiratory: Negative for cough, chest tightness, shortness of breath and wheezing.   Cardiovascular: Negative for chest pain and palpitations.  Gastrointestinal: Negative for abdominal pain, constipation and abdominal distention.  Endocrine: Negative for polydipsia.  Genitourinary: Negative for dysuria and frequency.  Musculoskeletal: Positive for neck pain. Negative for back pain and arthralgias.  Skin: Negative for rash.  Neurological: Negative for tremors, light-headedness and numbness.  Hematological: Does not bruise/bleed easily.  Psychiatric/Behavioral: Negative for behavioral problems and agitation.       Objective: Filed Vitals:   04/14/15 1203 04/14/15 1215  BP: 166/88 176/150  Pulse: 83   Temp: 97.7 F (36.5 C)   TempSrc: Oral   Resp: 18   Height: '5\' 1"'$  (  1.549 m)   Weight: 131 lb (59.421 kg)   SpO2: 98%       Physical Exam Constitutional: Patient appears well-developed and well-nourished. No distress. Neck: Tenderness on palpation of right sternocleidomastoid muscle, reduced range of motion from left-to-right. CVS: RRR, S1/S2 +, no murmurs, no gallops, no carotid bruit.  Pulmonary: Effort and breath sounds normal, no stridor, rhonchi, wheezes, rales.  Abdominal: Soft. BS +,  no distension, tenderness, rebound or guarding.  Musculoskeletal: L shoulder ROM  limited; forward elevation limited to 70 degrees.R shoulder is normal Lymphadenopathy: No lymphadenopathy noted, cervical, inguinal or axillary Neuro: Alert. Normal reflexes, muscle tone coordination. No cranial nerve deficit. Skin: Skin is warm and dry. No rash noted. Not diaphoretic. No erythema. No pallor. Psychiatric: Normal mood and affect. Behavior, judgment, thought content normal.       Assessment & Plan:   Essential hypertension: Uncontrolled. Increased dose of Coreg. Low-sodium, DASH diet. Blood pressure will be reassessed at her next office visit.  Hyperlipidemia: Uncontrolled. Commenced on atorvastatin.  Neck pain Likely muscle spasm. Tramadol added to regimen meanwhile advised to use heating pad and continue muscle relaxant  Tobacco abuse: This could contribute to elevated blood pressure she smoked about 2 hours ago. Spent 4 minutes discussing cessation but the patient is not interested at this time.  This note has been created with Surveyor, quantity. Any transcriptional errors are unintentional.

## 2015-05-11 ENCOUNTER — Other Ambulatory Visit: Payer: Self-pay | Admitting: Family Medicine

## 2015-05-14 ENCOUNTER — Ambulatory Visit: Payer: PPO | Admitting: Family Medicine

## 2015-05-21 ENCOUNTER — Ambulatory Visit: Payer: PPO | Attending: Family Medicine | Admitting: Family Medicine

## 2015-05-21 ENCOUNTER — Encounter: Payer: Self-pay | Admitting: Family Medicine

## 2015-05-21 VITALS — BP 198/68 | HR 53 | Temp 98.1°F | Resp 18 | Ht 60.0 in | Wt 132.0 lb

## 2015-05-21 DIAGNOSIS — M25512 Pain in left shoulder: Secondary | ICD-10-CM

## 2015-05-21 DIAGNOSIS — M25511 Pain in right shoulder: Secondary | ICD-10-CM | POA: Diagnosis not present

## 2015-05-21 DIAGNOSIS — R202 Paresthesia of skin: Secondary | ICD-10-CM | POA: Insufficient documentation

## 2015-05-21 DIAGNOSIS — G8929 Other chronic pain: Secondary | ICD-10-CM | POA: Insufficient documentation

## 2015-05-21 DIAGNOSIS — F17210 Nicotine dependence, cigarettes, uncomplicated: Secondary | ICD-10-CM | POA: Insufficient documentation

## 2015-05-21 DIAGNOSIS — I10 Essential (primary) hypertension: Secondary | ICD-10-CM | POA: Insufficient documentation

## 2015-05-21 DIAGNOSIS — Z7982 Long term (current) use of aspirin: Secondary | ICD-10-CM | POA: Diagnosis not present

## 2015-05-21 DIAGNOSIS — E785 Hyperlipidemia, unspecified: Secondary | ICD-10-CM | POA: Insufficient documentation

## 2015-05-21 DIAGNOSIS — R21 Rash and other nonspecific skin eruption: Secondary | ICD-10-CM | POA: Insufficient documentation

## 2015-05-21 DIAGNOSIS — Z79899 Other long term (current) drug therapy: Secondary | ICD-10-CM | POA: Diagnosis not present

## 2015-05-21 MED ORDER — OLMESARTAN MEDOXOMIL-HCTZ 20-12.5 MG PO TABS: 2.0000 | ORAL_TABLET | Freq: Once | ORAL | Status: DC

## 2015-05-21 MED ORDER — HYDROCORTISONE 2.5 % EX CREA: TOPICAL_CREAM | Freq: Two times a day (BID) | CUTANEOUS | Status: DC

## 2015-05-21 MED ORDER — NON FORMULARY
40.0000 mg | Freq: Once | Status: AC
  Administered 2015-05-21: 40 mg via ORAL

## 2015-05-21 NOTE — Progress Notes (Signed)
Pt's here for 38mof/up for cholesterol. Pt states that she feels lightheaded and pain in R shoulder.  Shoulder pain described to throbbing and tingling with a shooting pain rated at 7/10. Pt reports pain in shoulder x2wks.  Pt c/o itchy rash on R leg.  Pt states that she taken her meds this morning.

## 2015-05-21 NOTE — Patient Instructions (Signed)
Hypertension Hypertension, commonly called high blood pressure, is when the force of blood pumping through your arteries is too strong. Your arteries are the blood vessels that carry blood from your heart throughout your body. A blood pressure reading consists of a higher number over a lower number, such as 110/72. The higher number (systolic) is the pressure inside your arteries when your heart pumps. The lower number (diastolic) is the pressure inside your arteries when your heart relaxes. Ideally you want your blood pressure below 120/80. Hypertension forces your heart to work harder to pump blood. Your arteries may become narrow or stiff. Having untreated or uncontrolled hypertension can cause heart attack, stroke, kidney disease, and other problems. RISK FACTORS Some risk factors for high blood pressure are controllable. Others are not.  Risk factors you cannot control include:   Race. You may be at higher risk if you are African American.  Age. Risk increases with age.  Gender. Men are at higher risk than women before age 45 years. After age 65, women are at higher risk than men. Risk factors you can control include:  Not getting enough exercise or physical activity.  Being overweight.  Getting too much fat, sugar, calories, or salt in your diet.  Drinking too much alcohol. SIGNS AND SYMPTOMS Hypertension does not usually cause signs or symptoms. Extremely high blood pressure (hypertensive crisis) may cause headache, anxiety, shortness of breath, and nosebleed. DIAGNOSIS To check if you have hypertension, your health care provider will measure your blood pressure while you are seated, with your arm held at the level of your heart. It should be measured at least twice using the same arm. Certain conditions can cause a difference in blood pressure between your right and left arms. A blood pressure reading that is higher than normal on one occasion does not mean that you need treatment. If  it is not clear whether you have high blood pressure, you may be asked to return on a different day to have your blood pressure checked again. Or, you may be asked to monitor your blood pressure at home for 1 or more weeks. TREATMENT Treating high blood pressure includes making lifestyle changes and possibly taking medicine. Living a healthy lifestyle can help lower high blood pressure. You may need to change some of your habits. Lifestyle changes may include:  Following the DASH diet. This diet is high in fruits, vegetables, and whole grains. It is low in salt, red meat, and added sugars.  Keep your sodium intake below 2,300 mg per day.  Getting at least 30-45 minutes of aerobic exercise at least 4 times per week.  Losing weight if necessary.  Not smoking.  Limiting alcoholic beverages.  Learning ways to reduce stress. Your health care provider may prescribe medicine if lifestyle changes are not enough to get your blood pressure under control, and if one of the following is true:  You are 18-59 years of age and your systolic blood pressure is above 140.  You are 60 years of age or older, and your systolic blood pressure is above 150.  Your diastolic blood pressure is above 90.  You have diabetes, and your systolic blood pressure is over 140 or your diastolic blood pressure is over 90.  You have kidney disease and your blood pressure is above 140/90.  You have heart disease and your blood pressure is above 140/90. Your personal target blood pressure may vary depending on your medical conditions, your age, and other factors. HOME CARE INSTRUCTIONS    Have your blood pressure rechecked as directed by your health care provider.   Take medicines only as directed by your health care provider. Follow the directions carefully. Blood pressure medicines must be taken as prescribed. The medicine does not work as well when you skip doses. Skipping doses also puts you at risk for  problems.  Do not smoke.   Monitor your blood pressure at home as directed by your health care provider. SEEK MEDICAL CARE IF:   You think you are having a reaction to medicines taken.  You have recurrent headaches or feel dizzy.  You have swelling in your ankles.  You have trouble with your vision. SEEK IMMEDIATE MEDICAL CARE IF:  You develop a severe headache or confusion.  You have unusual weakness, numbness, or feel faint.  You have severe chest or abdominal pain.  You vomit repeatedly.  You have trouble breathing. MAKE SURE YOU:   Understand these instructions.  Will watch your condition.  Will get help right away if you are not doing well or get worse.   This information is not intended to replace advice given to you by your health care provider. Make sure you discuss any questions you have with your health care provider.   Document Released: 06/19/2005 Document Revised: 11/03/2014 Document Reviewed: 04/11/2013 Elsevier Interactive Patient Education 2016 [employer]  

## 2015-05-21 NOTE — Progress Notes (Signed)
Subjective:    Patient ID: Lisa Taylor, female    DOB: 1950-11-22, 64 y.o.   MRN: [id number]  HPI 64 year old female with a history of uncontrolled hypertension, hyperlipidemia who comes in today for follow-up of her blood pressure which is in the accelerated range today but review of her home blood pressure log reveals 126-140/69-72 and she reports compliance with her medications. She also states she did not smoke a cigarette before coming into the clinic. Endorses some insomnia and does not feel well rested overnight and so sleeps most of the time during the day; she has no known history of sleep apnea.  She also complains of some tingling in the fingers of her right hand shoulder pain but denies weakness in that extremity or difficulty with her speech. She has had chronic right shoulder pain for which she remains on a muscle relaxant and tramadol. Denies headache or blurry vision but does feel a bit lightheaded. Also complains of a pruritic rash in her right leg which is intermittent and denies history of insect bite  Past Medical History  Diagnosis Date  . Hypertension     History reviewed. No pertinent past surgical history.  Social History   Social History  . Marital Status: Married    Spouse Name: N/A  . Number of Children: N/A  . Years of Education: N/A   Occupational History  . Not on file.   Social History Main Topics  . Smoking status: Current Every Day Smoker -- 0.25 packs/day    Types: Cigarettes  . Smokeless tobacco: Not on file  . Alcohol Use: Yes     Comment: occassional  . Drug Use: Yes    Special: Marijuana     Comment: september 4  . Sexual Activity: Yes   Other Topics Concern  . Not on file   Social History Narrative    Allergies  Allergen Reactions  . Codeine Other (See Comments)    Nausea/vomitting; some dizziness    Current Outpatient Prescriptions on File Prior to Visit  Medication Sig Dispense Refill  . amLODipine (NORVASC)  10 MG tablet Take 1 tablet (10 mg total) by mouth daily. 30 tablet 2  . atorvastatin (LIPITOR) 20 MG tablet Take 1 tablet (20 mg total) by mouth daily. 30 tablet 3  . carvedilol (COREG) 12.5 MG tablet Take 1 tablet (12.5 mg total) by mouth 2 (two) times daily with a meal. 60 tablet 2  . cyclobenzaprine (FLEXERIL) 10 MG tablet Take 1 tablet (10 mg total) by mouth 2 (two) times daily as needed for muscle spasms. 30 tablet 1  . EQ ASPIRIN ADULT LOW DOSE 81 MG EC tablet TAKE ONE TABLET BY MOUTH ONCE DAILY 30 tablet 0  . traMADol (ULTRAM) 50 MG tablet Take 1 tablet (50 mg total) by mouth every 12 (twelve) hours as needed. 30 tablet 0  . triamcinolone (NASACORT AQ) 55 MCG/ACT AERO nasal inhaler Place 2 sprays into the nose daily. 1 Inhaler 0   No current facility-administered medications on file prior to visit.    Review of Systems Constitutional: Negative for activity change, appetite change and fatigue.  HENT: Negative for congestion, sinus pressure and sore throat.   Eyes: Negative for visual disturbance.  Respiratory: Negative for cough, chest tightness, shortness of breath and wheezing.   Cardiovascular: Negative for chest pain and palpitations.  Gastrointestinal: Negative for abdominal pain, constipation and abdominal distention.  Endocrine: Negative for polydipsia.  Genitourinary: Negative for dysuria and frequency.  Musculoskeletal:  Positive for right shoulder pain Negative for back pain and arthralgias.  Skin: Negative for rash.  Neurological: Negative for tremors, light-headedness and positive for tingling on tips of right fingers.  Hematological: Does not bruise/bleed easily.  Psychiatric/Behavioral: Negative for behavioral problems and agitation    Objective: Filed Vitals:   05/21/15 0904 05/21/15 0915  BP: 208/70 200/68  Pulse: 53   Temp: 98.1 F (36.7 C)   TempSrc: Oral   Resp: 18   Height: 5' (1.524 m)   Weight: 132 lb (59.875 kg)   SpO2: 98%       Physical  Exam Constitutional: Patient appears well-developed and well-nourished. No distress. Neck: Tenderness on palpation of right sternocleidomastoid muscle, reduced range of motion from left-to-right. CVS: RRR, S1/S2 +, no murmurs, no gallops, no carotid bruit.  Pulmonary: Effort and breath sounds normal, no stridor, rhonchi, wheezes, rales.  Abdominal: Soft. BS +,  no distension, tenderness, rebound or guarding.  Musculoskeletal: L shoulder ROM limited; forward elevation limited to 70 degrees.R shoulder is normal Lymphadenopathy: No lymphadenopathy noted, cervical, inguinal or axillary Neuro: Alert. Normal reflexes, muscle tone coordination. No cranial nerve deficit. Skin: Skin is warm and dry. No rash noted. Not diaphoretic. No erythema. No pallor. Psychiatric: Normal mood and affect. Behavior, judgment, thought content normal.         Assessment & Plan:  Accelerated hypertension: Uncontrolled. Benicar '40mg'$  given, patient observed for 30 mins and blood pressure repeated Increased dose of Coreg. Low-sodium, DASH diet. Blood pressure will be reassessed at her next office visit; advised to bring in blood pressure meter for comparison of reading  Hyperlipidemia: Uncontrolled. Continue atorvastatin.  Right shoulder pain; Continue tramadol and flexeril Tingling in fingers could be due to nerve impingement; also discussed stroke symptoms and Ed precautions.  Rash: Unknown etiology Placed on Triamcinolone

## 2015-06-08 ENCOUNTER — Other Ambulatory Visit: Payer: Self-pay | Admitting: Family Medicine

## 2015-06-11 ENCOUNTER — Ambulatory Visit: Payer: PPO | Attending: Family Medicine | Admitting: Family Medicine

## 2015-07-10 ENCOUNTER — Other Ambulatory Visit: Payer: Self-pay | Admitting: Family Medicine

## 2015-07-16 ENCOUNTER — Other Ambulatory Visit: Payer: Self-pay | Admitting: Family Medicine

## 2015-07-30 ENCOUNTER — Encounter: Payer: Self-pay | Admitting: Family Medicine

## 2015-07-30 ENCOUNTER — Ambulatory Visit: Payer: PPO | Attending: Family Medicine | Admitting: Family Medicine

## 2015-07-30 VITALS — BP 210/76 | HR 50 | Temp 98.5°F | Resp 15 | Ht 61.0 in | Wt 125.0 lb

## 2015-07-30 DIAGNOSIS — M25512 Pain in left shoulder: Secondary | ICD-10-CM | POA: Diagnosis not present

## 2015-07-30 DIAGNOSIS — F17210 Nicotine dependence, cigarettes, uncomplicated: Secondary | ICD-10-CM | POA: Diagnosis not present

## 2015-07-30 DIAGNOSIS — I10 Essential (primary) hypertension: Secondary | ICD-10-CM | POA: Diagnosis not present

## 2015-07-30 DIAGNOSIS — E785 Hyperlipidemia, unspecified: Secondary | ICD-10-CM | POA: Insufficient documentation

## 2015-07-30 DIAGNOSIS — F329 Major depressive disorder, single episode, unspecified: Secondary | ICD-10-CM | POA: Insufficient documentation

## 2015-07-30 DIAGNOSIS — M25511 Pain in right shoulder: Secondary | ICD-10-CM | POA: Insufficient documentation

## 2015-07-30 DIAGNOSIS — Z131 Encounter for screening for diabetes mellitus: Secondary | ICD-10-CM

## 2015-07-30 LAB — POCT GLYCOSYLATED HEMOGLOBIN (HGB A1C): Hemoglobin A1C: 6.4

## 2015-07-30 MED ORDER — NIFEDIPINE ER 90 MG PO TB24: 90.0000 mg | ORAL_TABLET | Freq: Every day | ORAL | Status: DC

## 2015-07-30 MED ORDER — GABAPENTIN 300 MG PO CAPS: 300.0000 mg | ORAL_CAPSULE | Freq: Two times a day (BID) | ORAL | Status: DC

## 2015-07-30 MED ORDER — OLMESARTAN MEDOXOMIL 40 MG PO TABS: 40.0000 mg | ORAL_TABLET | Freq: Every day | ORAL | Status: DC

## 2015-07-30 NOTE — Progress Notes (Signed)
Patient here for follow up on HTN Right shoulder pain that radiates down arm with numbness and tingling that has ben going on for months

## 2015-07-30 NOTE — Progress Notes (Signed)
Subjective:    Patient ID: Lisa Taylor, female    DOB: [birth year], 65 y.o.   MRN: [id number]  HPI 65 year old female with a history of limited hypertension, hyperlipidemia who comes into the clinic for a follow-up visit. She endorses compliance with her medications but her blood pressure today is in the accelerated range of 240/140 and she denies any chest pains or shortness of breath. Her blood pressure log reveals elevated blood pressures of 168-187/88-95.  She also complains of persisting right shoulder pain with associated tingling on the right arm which gets worse when she lies on that side. Her left shoulder has reduced range of motion and this is chronic. I had placed her on tramadol previously which she has been taking.  She endorses being stressed as she is currently going through a separation with her husband and has been trying to avoid smoking but her blood pressure remains elevated.  Past Medical History  Diagnosis Date  . Hypertension     History reviewed. No pertinent past surgical history.  Social History   Social History  . Marital Status: Married    Spouse Name: N/A  . Number of Children: N/A  . Years of Education: N/A   Occupational History  . Not on file.   Social History Main Topics  . Smoking status: Current Every Day Smoker -- 0.25 packs/day    Types: Cigarettes  . Smokeless tobacco: Not on file  . Alcohol Use: Yes     Comment: occassional  . Drug Use: Yes    Special: Marijuana     Comment: september 4  . Sexual Activity: Yes   Other Topics Concern  . Not on file   Social History Narrative    Allergies  Allergen Reactions  . Codeine Other (See Comments)    Nausea/vomitting; some dizziness      Review of Systems  Constitutional: Negative for activity change, appetite change and fatigue.  HENT: Negative for congestion, sinus pressure and sore throat.   Eyes: Negative for visual disturbance.  Respiratory: Negative for cough,  chest tightness, shortness of breath and wheezing.   Cardiovascular: Negative for chest pain and palpitations.  Gastrointestinal: Negative for abdominal pain, constipation and abdominal distention.  Endocrine: Negative for polydipsia.  Genitourinary: Negative for dysuria and frequency.  Musculoskeletal:       See history of present illness  Skin: Negative for rash.  Neurological: Positive for numbness. Negative for tremors and light-headedness.  Hematological: Does not bruise/bleed easily.  Psychiatric/Behavioral: Negative for behavioral problems and agitation.       Objective: Filed Vitals:   07/30/15 1500 07/30/15 1515 07/30/15 1603 07/30/15 1625  BP: 240/140 240/90 210/84 210/76  Pulse: 60   50  Temp: 98.5 F (36.9 C)     Resp: 15     Height: '5\' 1"'$  (1.549 m)     Weight: 125 lb (56.7 kg)     SpO2: 96%         Physical Exam Constitutional: Patient appears well-developed and well-nourished. No distress. Neck: Tenderness on palpation of right sternocleidomastoid muscle, reduced range of motion from left-to-right. CVS: RRR, S1/S2 +, no murmurs, no gallops, no carotid bruit.  Pulmonary: Effort and breath sounds normal, no stridor, rhonchi, wheezes, rales.  Abdominal: Soft. BS +,  no distension, tenderness, rebound or guarding.  Musculoskeletal: L shoulder ROM limited; forward elevation limited to 70 degrees.R shoulder is normal Lymphadenopathy: No lymphadenopathy noted, cervical, inguinal or axillary Neuro: Alert. Normal reflexes, muscle tone coordination.  No cranial nerve deficit. Skin: Skin is warm and dry. No rash noted. Not diaphoretic. No erythema. No pallor. Psychiatric: Normal mood and affect. Behavior, judgment, thought content normal.        Assessment & Plan:  Accelerated hypertension: Uncontrolled. Benicar '40mg'$  given, patient observed for 30 mins and blood pressure repeated Amlodipine and switch to nifedipine, continue Coreg. If blood pressure is elevated at  her next visit I will add an ACE inhibitor to her regimen Might have a component of renovascular hypertension especially with a history of elevated plasma and may need to see nephrologist Low-sodium, DASH diet. Blood pressure will be reassessed at her next office visit; advised to bring in blood pressure meter for comparison of reading  Hyperlipidemia: Uncontrolled. Continue atorvastatin.  Bilateral shoulder pain; Left shoulder with possible adhesive capsulitis versus frozen shoulder. Right shoulder with possible impingement. Continue tramadol and flexeril Gabapentin added to regimen. Tingling in fingers could be due to nerve impingement; also discussed stroke symptoms and Ed precautions.  Depression: She is currently going through a separation and is not ready to speak to LCSW at this time but will do so at her next visit.

## 2015-07-30 NOTE — Patient Instructions (Signed)
Hypertension Hypertension, commonly called high blood pressure, is when the force of blood pumping through your arteries is too strong. Your arteries are the blood vessels that carry blood from your heart throughout your body. A blood pressure reading consists of a higher number over a lower number, such as 110/72. The higher number (systolic) is the pressure inside your arteries when your heart pumps. The lower number (diastolic) is the pressure inside your arteries when your heart relaxes. Ideally you want your blood pressure below 120/80. Hypertension forces your heart to work harder to pump blood. Your arteries may become narrow or stiff. Having untreated or uncontrolled hypertension can cause heart attack, stroke, kidney disease, and other problems. RISK FACTORS Some risk factors for high blood pressure are controllable. Others are not.  Risk factors you cannot control include:   Race. You may be at higher risk if you are African American.  Age. Risk increases with age.  Gender. Men are at higher risk than women before age 45 years. After age 65, women are at higher risk than men. Risk factors you can control include:  Not getting enough exercise or physical activity.  Being overweight.  Getting too much fat, sugar, calories, or salt in your diet.  Drinking too much alcohol. SIGNS AND SYMPTOMS Hypertension does not usually cause signs or symptoms. Extremely high blood pressure (hypertensive crisis) may cause headache, anxiety, shortness of breath, and nosebleed. DIAGNOSIS To check if you have hypertension, your health care provider will measure your blood pressure while you are seated, with your arm held at the level of your heart. It should be measured at least twice using the same arm. Certain conditions can cause a difference in blood pressure between your right and left arms. A blood pressure reading that is higher than normal on one occasion does not mean that you need treatment. If  it is not clear whether you have high blood pressure, you may be asked to return on a different day to have your blood pressure checked again. Or, you may be asked to monitor your blood pressure at home for 1 or more weeks. TREATMENT Treating high blood pressure includes making lifestyle changes and possibly taking medicine. Living a healthy lifestyle can help lower high blood pressure. You may need to change some of your habits. Lifestyle changes may include:  Following the DASH diet. This diet is high in fruits, vegetables, and whole grains. It is low in salt, red meat, and added sugars.  Keep your sodium intake below 2,300 mg per day.  Getting at least 30-45 minutes of aerobic exercise at least 4 times per week.  Losing weight if necessary.  Not smoking.  Limiting alcoholic beverages.  Learning ways to reduce stress. Your health care provider may prescribe medicine if lifestyle changes are not enough to get your blood pressure under control, and if one of the following is true:  You are 18-59 years of age and your systolic blood pressure is above 140.  You are 60 years of age or older, and your systolic blood pressure is above 150.  Your diastolic blood pressure is above 90.  You have diabetes, and your systolic blood pressure is over 140 or your diastolic blood pressure is over 90.  You have kidney disease and your blood pressure is above 140/90.  You have heart disease and your blood pressure is above 140/90. Your personal target blood pressure may vary depending on your medical conditions, your age, and other factors. HOME CARE INSTRUCTIONS    Have your blood pressure rechecked as directed by your health care provider.   Take medicines only as directed by your health care provider. Follow the directions carefully. Blood pressure medicines must be taken as prescribed. The medicine does not work as well when you skip doses. Skipping doses also puts you at risk for  problems.  Do not smoke.   Monitor your blood pressure at home as directed by your health care provider. SEEK MEDICAL CARE IF:   You think you are having a reaction to medicines taken.  You have recurrent headaches or feel dizzy.  You have swelling in your ankles.  You have trouble with your vision. SEEK IMMEDIATE MEDICAL CARE IF:  You develop a severe headache or confusion.  You have unusual weakness, numbness, or feel faint.  You have severe chest or abdominal pain.  You vomit repeatedly.  You have trouble breathing. MAKE SURE YOU:   Understand these instructions.  Will watch your condition.  Will get help right away if you are not doing well or get worse.   This information is not intended to replace advice given to you by your health care provider. Make sure you discuss any questions you have with your health care provider.   Document Released: 06/19/2005 Document Revised: 11/03/2014 Document Reviewed: 04/11/2013 Elsevier Interactive Patient Education 2016 [employer]  

## 2015-08-02 ENCOUNTER — Other Ambulatory Visit: Payer: Self-pay | Admitting: Family Medicine

## 2015-08-02 DIAGNOSIS — I10 Essential (primary) hypertension: Secondary | ICD-10-CM

## 2015-08-02 MED ORDER — NON FORMULARY: 40.0000 mg | Freq: Once | Status: DC

## 2015-08-06 ENCOUNTER — Encounter: Payer: Self-pay | Admitting: Family Medicine

## 2015-08-06 ENCOUNTER — Ambulatory Visit: Payer: PPO | Attending: Family Medicine | Admitting: Family Medicine

## 2015-08-06 VITALS — BP 134/65 | HR 74 | Temp 98.2°F | Resp 16 | Ht 60.0 in | Wt 130.0 lb

## 2015-08-06 DIAGNOSIS — M25511 Pain in right shoulder: Secondary | ICD-10-CM | POA: Diagnosis not present

## 2015-08-06 DIAGNOSIS — R739 Hyperglycemia, unspecified: Secondary | ICD-10-CM | POA: Diagnosis not present

## 2015-08-06 DIAGNOSIS — M25512 Pain in left shoulder: Secondary | ICD-10-CM

## 2015-08-06 DIAGNOSIS — R7303 Prediabetes: Secondary | ICD-10-CM | POA: Diagnosis not present

## 2015-08-06 DIAGNOSIS — G629 Polyneuropathy, unspecified: Secondary | ICD-10-CM | POA: Insufficient documentation

## 2015-08-06 DIAGNOSIS — E785 Hyperlipidemia, unspecified: Secondary | ICD-10-CM | POA: Insufficient documentation

## 2015-08-06 DIAGNOSIS — Z79899 Other long term (current) drug therapy: Secondary | ICD-10-CM | POA: Diagnosis not present

## 2015-08-06 DIAGNOSIS — G8929 Other chronic pain: Secondary | ICD-10-CM | POA: Insufficient documentation

## 2015-08-06 DIAGNOSIS — I10 Essential (primary) hypertension: Secondary | ICD-10-CM | POA: Insufficient documentation

## 2015-08-06 LAB — GLUCOSE, POCT (MANUAL RESULT ENTRY): POC GLUCOSE: 121 mg/dL — AB (ref 70–99)

## 2015-08-06 MED ORDER — HYDROCORTISONE 2.5 % EX CREA: TOPICAL_CREAM | Freq: Two times a day (BID) | CUTANEOUS | Status: AC

## 2015-08-06 MED ORDER — GABAPENTIN 300 MG PO CAPS: 300.0000 mg | ORAL_CAPSULE | Freq: Two times a day (BID) | ORAL | Status: DC

## 2015-08-06 MED ORDER — ATORVASTATIN CALCIUM 20 MG PO TABS: 20.0000 mg | ORAL_TABLET | Freq: Every day | ORAL | Status: DC

## 2015-08-06 MED ORDER — ASPIRIN 81 MG PO CHEW: CHEWABLE_TABLET | ORAL | Status: DC

## 2015-08-06 MED ORDER — CYCLOBENZAPRINE HCL 10 MG PO TABS: 10.0000 mg | ORAL_TABLET | Freq: Two times a day (BID) | ORAL | Status: DC | PRN

## 2015-08-06 MED ORDER — NIFEDIPINE ER 90 MG PO TB24: 90.0000 mg | ORAL_TABLET | Freq: Every day | ORAL | Status: DC

## 2015-08-06 MED ORDER — CARVEDILOL 12.5 MG PO TABS: 12.5000 mg | ORAL_TABLET | Freq: Two times a day (BID) | ORAL | Status: DC

## 2015-08-06 NOTE — Progress Notes (Signed)
Patient's here for f/up hypertension.   Patient denies pain today.  Patient requesting a refill hydrocortisone cream, carvedilol, '81mg'$  aspirin.

## 2015-08-06 NOTE — Patient Instructions (Signed)
Hypertension Hypertension, commonly called high blood pressure, is when the force of blood pumping through your arteries is too strong. Your arteries are the blood vessels that carry blood from your heart throughout your body. A blood pressure reading consists of a higher number over a lower number, such as 110/72. The higher number (systolic) is the pressure inside your arteries when your heart pumps. The lower number (diastolic) is the pressure inside your arteries when your heart relaxes. Ideally you want your blood pressure below 120/80. Hypertension forces your heart to work harder to pump blood. Your arteries may become narrow or stiff. Having untreated or uncontrolled hypertension can cause heart attack, stroke, kidney disease, and other problems. RISK FACTORS Some risk factors for high blood pressure are controllable. Others are not.  Risk factors you cannot control include:   Race. You may be at higher risk if you are African American.  Age. Risk increases with age.  Gender. Men are at higher risk than women before age 45 years. After age 65, women are at higher risk than men. Risk factors you can control include:  Not getting enough exercise or physical activity.  Being overweight.  Getting too much fat, sugar, calories, or salt in your diet.  Drinking too much alcohol. SIGNS AND SYMPTOMS Hypertension does not usually cause signs or symptoms. Extremely high blood pressure (hypertensive crisis) may cause headache, anxiety, shortness of breath, and nosebleed. DIAGNOSIS To check if you have hypertension, your health care provider will measure your blood pressure while you are seated, with your arm held at the level of your heart. It should be measured at least twice using the same arm. Certain conditions can cause a difference in blood pressure between your right and left arms. A blood pressure reading that is higher than normal on one occasion does not mean that you need treatment. If  it is not clear whether you have high blood pressure, you may be asked to return on a different day to have your blood pressure checked again. Or, you may be asked to monitor your blood pressure at home for 1 or more weeks. TREATMENT Treating high blood pressure includes making lifestyle changes and possibly taking medicine. Living a healthy lifestyle can help lower high blood pressure. You may need to change some of your habits. Lifestyle changes may include:  Following the DASH diet. This diet is high in fruits, vegetables, and whole grains. It is low in salt, red meat, and added sugars.  Keep your sodium intake below 2,300 mg per day.  Getting at least 30-45 minutes of aerobic exercise at least 4 times per week.  Losing weight if necessary.  Not smoking.  Limiting alcoholic beverages.  Learning ways to reduce stress. Your health care provider may prescribe medicine if lifestyle changes are not enough to get your blood pressure under control, and if one of the following is true:  You are 18-59 years of age and your systolic blood pressure is above 140.  You are 60 years of age or older, and your systolic blood pressure is above 150.  Your diastolic blood pressure is above 90.  You have diabetes, and your systolic blood pressure is over 140 or your diastolic blood pressure is over 90.  You have kidney disease and your blood pressure is above 140/90.  You have heart disease and your blood pressure is above 140/90. Your personal target blood pressure may vary depending on your medical conditions, your age, and other factors. HOME CARE INSTRUCTIONS    Have your blood pressure rechecked as directed by your health care provider.   Take medicines only as directed by your health care provider. Follow the directions carefully. Blood pressure medicines must be taken as prescribed. The medicine does not work as well when you skip doses. Skipping doses also puts you at risk for  problems.  Do not smoke.   Monitor your blood pressure at home as directed by your health care provider. SEEK MEDICAL CARE IF:   You think you are having a reaction to medicines taken.  You have recurrent headaches or feel dizzy.  You have swelling in your ankles.  You have trouble with your vision. SEEK IMMEDIATE MEDICAL CARE IF:  You develop a severe headache or confusion.  You have unusual weakness, numbness, or feel faint.  You have severe chest or abdominal pain.  You vomit repeatedly.  You have trouble breathing. MAKE SURE YOU:   Understand these instructions.  Will watch your condition.  Will get help right away if you are not doing well or get worse.   This information is not intended to replace advice given to you by your health care provider. Make sure you discuss any questions you have with your health care provider.   Document Released: 06/19/2005 Document Revised: 11/03/2014 Document Reviewed: 04/11/2013 Elsevier Interactive Patient Education 2016 [employer]  

## 2015-08-06 NOTE — Progress Notes (Signed)
Subjective:  Patient ID: Lisa Taylor, female    DOB: 1950/08/16  Age: 65 y.o. MRN: [id number]  CC: Follow-up and Hypertension   HPI Lisa Taylor is a 65 year old female with history of hypertension, hyperlipidemia, chronic bilateral shoulder pain who comes into the clinic for a follow-up of her blood pressure. Her blood pressure has remained in the accelerated range and amlodipine was switched to nifedipine at her last office visit with resulting improvement in her blood pressure today. She had history the low-sodium diet and has been exercising as tolerated.  She is also being treated for chronic shoulder pain worse in the right joint with associated neuropathy for which she was placed on gabapentin. She reports some improvement with the use of gabapentin however it makes her sleepy and feel "drunk" and so she takes it at night.  She does not have any other issues at this time.  Outpatient Prescriptions Prior to Visit  Medication Sig Dispense Refill  . traMADol (ULTRAM) 50 MG tablet Take 1 tablet (50 mg total) by mouth every 12 (twelve) hours as needed. 30 tablet 0  . atorvastatin (LIPITOR) 20 MG tablet Take 1 tablet (20 mg total) by mouth daily. 30 tablet 3  . carvedilol (COREG) 12.5 MG tablet Take 1 tablet (12.5 mg total) by mouth 2 (two) times daily with a meal. 60 tablet 2  . cyclobenzaprine (FLEXERIL) 10 MG tablet Take 1 tablet (10 mg total) by mouth 2 (two) times daily as needed for muscle spasms. 30 tablet 1  . EQ ASPIRIN LOW DOSE 81 MG chewable tablet CHEW AND SWALLOW ONE TABLET BY MOUTH ONCE DAILY 30 tablet 2  . gabapentin (NEURONTIN) 300 MG capsule Take 1 capsule (300 mg total) by mouth 2 (two) times daily. 60 capsule 2  . hydrocortisone 2.5 % cream Apply topically 2 (two) times daily. Apply to legs 2 times a day. Dispense 30 grams 30 g 3  . NIFEdipine (ADALAT CC) 90 MG 24 hr tablet Take 1 tablet (90 mg total) by mouth daily. 30 tablet 2   Facility-Administered  Medications Prior to Visit  Medication Dose Route Frequency Provider Last Rate Last Dose  . NON FORMULARY 40 mg  40 mg Oral Once Arnoldo Morale, MD        ROS Review of Systems Constitutional: Negative for activity change, appetite change and fatigue.  HENT: Negative for congestion, sinus pressure and sore throat.   Eyes: Negative for visual disturbance.  Respiratory: Negative for cough, chest tightness, shortness of breath and wheezing.   Cardiovascular: Negative for chest pain and palpitations.  Gastrointestinal: Negative for abdominal pain, constipation and abdominal distention.  Endocrine: Negative for polydipsia.  Genitourinary: Negative for dysuria and frequency.  Musculoskeletal:       See history of present illness  Skin: Negative for rash.  Neurological: Positive for numbness. Negative for tremors and light-headedness.  Hematological: Does not bruise/bleed easily.  Psychiatric/Behavioral: Negative for behavioral problems and agitation.    Objective:  BP 134/65 mmHg  Pulse 74  Temp(Src) 98.2 F (36.8 C) (Oral)  Resp 16  Ht 5' (1.524 m)  Wt 130 lb (58.968 kg)  BMI 25.39 kg/m2  SpO2 99%  BP/Weight 08/06/2015 07/30/2015 03/47/4259  Systolic BP 563 875 643  Diastolic BP 65 76 68  Wt. (Lbs) 130 125 132  BMI 25.39 23.63 25.78      Physical Exam Constitutional: Patient appears well-developed and well-nourished. No distress. Neck: Tenderness on palpation of right sternocleidomastoid muscle, reduced  range of motion from left-to-right. CVS: RRR, S1/S2 +, no murmurs, no gallops, no carotid bruit.  Pulmonary: Effort and breath sounds normal, no stridor, rhonchi, wheezes, rales.  Abdominal: Soft. BS +,  no distension, tenderness, rebound or guarding.  Musculoskeletal: L shoulder ROM limited; forward elevation limited to 70 degrees.R shoulder is normal Lymphadenopathy: No lymphadenopathy noted, cervical, inguinal or axillary Neuro: Alert. Normal reflexes, muscle tone  coordination. No cranial nerve deficit. Skin: Skin is warm and dry. No rash noted. Not diaphoretic. No erythema. No pallor. Psychiatric: Normal mood and affect. Behavior, judgment, thought content normal.  Assessment & Plan:   1. Screening for diabetes mellitus Random blood sugar is 121; A1c was 6.4 at her last office visit-prediabetes - POCT glucose (manual entry)  2. Pain of both shoulder joints - cyclobenzaprine (FLEXERIL) 10 MG tablet; Take 1 tablet (10 mg total) by mouth 2 (two) times daily as needed for muscle spasms.  Dispense: 30 tablet; Refill: 1 - gabapentin (NEURONTIN) 300 MG capsule; Take 1 capsule (300 mg total) by mouth 2 (two) times daily.  Dispense: 60 capsule; Refill: 3  3. Essential hypertension Controlled - carvedilol (COREG) 12.5 MG tablet; Take 1 tablet (12.5 mg total) by mouth 2 (two) times daily with a meal.  Dispense: 60 tablet; Refill: 3 - NIFEdipine (ADALAT CC) 90 MG 24 hr tablet; Take 1 tablet (90 mg total) by mouth daily.  Dispense: 30 tablet; Refill: 2  4. 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 Hyperlipidemia Continue statin and we'll repeat lipid panel at next visit. - atorvastatin (LIPITOR) 20 MG tablet; Take 1 tablet (20 mg total) by mouth daily.  Dispense: 30 tablet; Refill: 3   Meds ordered this encounter  Medications  . atorvastatin (LIPITOR) 20 MG tablet    Sig: Take 1 tablet (20 mg total) by mouth daily.    Dispense:  30 tablet    Refill:  3  . hydrocortisone 2.5 % cream    Sig: Apply topically 2 (two) times daily. Apply to legs 2 times a day. Dispense 30 grams    Dispense:  30 g    Refill:  3  . aspirin (EQ ASPIRIN LOW DOSE) 81 MG chewable tablet    Sig: CHEW AND SWALLOW ONE TABLET BY MOUTH ONCE DAILY    Dispense:  30 tablet    Refill:  3  . cyclobenzaprine (FLEXERIL) 10 MG tablet    Sig: Take 1 tablet (10 mg total) by mouth 2 (two) times daily as needed for muscle spasms.    Dispense:   30 tablet    Refill:  1  . carvedilol (COREG) 12.5 MG tablet    Sig: Take 1 tablet (12.5 mg total) by mouth 2 (two) times daily with a meal.    Dispense:  60 tablet    Refill:  3  . NIFEdipine (ADALAT CC) 90 MG 24 hr tablet    Sig: Take 1 tablet (90 mg total) by mouth daily.    Dispense:  30 tablet    Refill:  2    Discontinue Amlodipine  . gabapentin (NEURONTIN) 300 MG capsule    Sig: Take 1 capsule (300 mg total) by mouth 2 (two) times daily.    Dispense:  60 capsule    Refill:  3    Follow-up: Return in about 3 months (around 11/03/2015) for follow up on Hypertension.   Arnoldo Morale MD

## 2015-09-23 ENCOUNTER — Encounter: Payer: Self-pay | Admitting: Gastroenterology

## 2015-11-02 ENCOUNTER — Other Ambulatory Visit: Payer: Self-pay | Admitting: *Deleted

## 2015-11-02 DIAGNOSIS — I10 Essential (primary) hypertension: Secondary | ICD-10-CM

## 2015-11-02 MED ORDER — NIFEDIPINE ER 90 MG PO TB24: 90.0000 mg | ORAL_TABLET | Freq: Every day | ORAL | Status: DC

## 2015-11-10 ENCOUNTER — Encounter: Payer: Self-pay | Admitting: Family Medicine

## 2015-11-10 ENCOUNTER — Telehealth: Payer: Self-pay

## 2015-11-10 ENCOUNTER — Ambulatory Visit: Payer: PPO | Attending: Family Medicine | Admitting: Family Medicine

## 2015-11-10 VITALS — BP 180/80 | HR 65 | Temp 98.0°F | Resp 16 | Ht 61.0 in | Wt 127.8 lb

## 2015-11-10 DIAGNOSIS — Z7982 Long term (current) use of aspirin: Secondary | ICD-10-CM | POA: Diagnosis not present

## 2015-11-10 DIAGNOSIS — E785 Hyperlipidemia, unspecified: Secondary | ICD-10-CM | POA: Diagnosis not present

## 2015-11-10 DIAGNOSIS — Z79899 Other long term (current) drug therapy: Secondary | ICD-10-CM | POA: Insufficient documentation

## 2015-11-10 DIAGNOSIS — M25512 Pain in left shoulder: Secondary | ICD-10-CM

## 2015-11-10 DIAGNOSIS — R7303 Prediabetes: Secondary | ICD-10-CM | POA: Diagnosis not present

## 2015-11-10 DIAGNOSIS — G629 Polyneuropathy, unspecified: Secondary | ICD-10-CM | POA: Diagnosis not present

## 2015-11-10 DIAGNOSIS — I10 Essential (primary) hypertension: Secondary | ICD-10-CM | POA: Diagnosis not present

## 2015-11-10 DIAGNOSIS — M25511 Pain in right shoulder: Secondary | ICD-10-CM | POA: Diagnosis not present

## 2015-11-10 DIAGNOSIS — E119 Type 2 diabetes mellitus without complications: Secondary | ICD-10-CM | POA: Insufficient documentation

## 2015-11-10 MED ORDER — CARVEDILOL 12.5 MG PO TABS: 12.5000 mg | ORAL_TABLET | Freq: Two times a day (BID) | ORAL | Status: DC

## 2015-11-10 MED ORDER — GABAPENTIN 300 MG PO CAPS: 300.0000 mg | ORAL_CAPSULE | Freq: Two times a day (BID) | ORAL | Status: DC

## 2015-11-10 MED ORDER — NIFEDIPINE ER 90 MG PO TB24: 90.0000 mg | ORAL_TABLET | Freq: Every day | ORAL | Status: DC

## 2015-11-10 MED ORDER — ATORVASTATIN CALCIUM 20 MG PO TABS: 20.0000 mg | ORAL_TABLET | Freq: Every day | ORAL | Status: DC

## 2015-11-10 NOTE — Telephone Encounter (Signed)
Placed call to patient to return my call. Labs were not obtain and patient needs to make a lab visit only.  Note to patient:  Please make a lab visit. Dr.Amao ordered labs on your visit 11/10/15.

## 2015-11-10 NOTE — Progress Notes (Signed)
Patient's here for f/up HTN.  Patient states she's having numbness in the tips of her R hand and numbness in her bottom lips that has gotten worse since last OV.  Patient states she took her BP meds @ 11:15 this morning.

## 2015-11-10 NOTE — Progress Notes (Signed)
Subjective:  Patient ID: Lisa Taylor, female    DOB: [birth year]  Age: 65 y.o. MRN: [id number]  CC: Follow-up and Hypertension   HPI Lisa Taylor is a 65 year old female with a history of hypertension, hyperlipidemia, Pre Diabetes who comes into the clinic for follow-up visit.  Her blood pressure is severely elevated today but she reports taking her medications about 30 minutes ago; her blood pressure log from home reveals blood pressures of 110/77, 125/70. Denies headaches, blurry vision or chest pain. She has not been compliant with atorvastatin neither has she been compliant with a low-cholesterol diet because she states she has been going through a lot lately but is motivated to make dietary changes.  She complains of numbness of the tip of her fingers which occur more night but denies wrist pain or trauma to the hands; she was placed on gabapentin previously which she has not been taking.  Outpatient Prescriptions Prior to Visit  Medication Sig Dispense Refill  . aspirin (EQ ASPIRIN LOW DOSE) 81 MG chewable tablet CHEW AND SWALLOW ONE TABLET BY MOUTH ONCE DAILY 30 tablet 3  . cyclobenzaprine (FLEXERIL) 10 MG tablet Take 1 tablet (10 mg total) by mouth 2 (two) times daily as needed for muscle spasms. 30 tablet 1  . hydrocortisone 2.5 % cream Apply topically 2 (two) times daily. Apply to legs 2 times a day. Dispense 30 grams 30 g 3  . traMADol (ULTRAM) 50 MG tablet Take 1 tablet (50 mg total) by mouth every 12 (twelve) hours as needed. 30 tablet 0  . atorvastatin (LIPITOR) 20 MG tablet Take 1 tablet (20 mg total) by mouth daily. 30 tablet 3  . carvedilol (COREG) 12.5 MG tablet Take 1 tablet (12.5 mg total) by mouth 2 (two) times daily with a meal. 60 tablet 3  . gabapentin (NEURONTIN) 300 MG capsule Take 1 capsule (300 mg total) by mouth 2 (two) times daily. 60 capsule 3  . NIFEdipine (ADALAT CC) 90 MG 24 hr tablet Take 1 tablet (90 mg total) by mouth daily. 30 tablet 2    Facility-Administered Medications Prior to Visit  Medication Dose Route Frequency Provider Last Rate Last Dose  . NON FORMULARY 40 mg  40 mg Oral Once Arnoldo Morale, MD        ROS Review of Systems Constitutional: Negative for activity change, appetite change and fatigue.  HENT: Negative for congestion, sinus pressure and sore throat.   Eyes: Negative for visual disturbance.  Respiratory: Negative for cough, chest tightness, shortness of breath and wheezing.   Cardiovascular: Negative for chest pain and palpitations.  Gastrointestinal: Negative for abdominal pain, constipation and abdominal distention.  Endocrine: Negative for polydipsia.  Genitourinary: Negative for dysuria and frequency.  Musculoskeletal: Negative for joint pains Skin: Negative for rash.  Neurological: Positive for numbness. Negative for tremors and light-headedness.  Hematological: Does not bruise/bleed easily.  Psychiatric/Behavioral: Negative for behavioral problems and agitation.   Objective:  BP 180/80 mmHg  Pulse 65  Temp(Src) 98 F (36.7 C) (Oral)  Resp 16  Ht '5\' 1"'$  (1.549 m)  Wt 127 lb 12.8 oz (57.97 kg)  BMI 24.16 kg/m2  SpO2 99%  BP/Weight 11/10/2015 08/06/2015 8/93/7342  Systolic BP 876 811 572  Diastolic BP 80 65 76  Wt. (Lbs) 127.8 130 125  BMI 24.16 25.39 23.63      Physical Exam Constitutional: Patient appears well-developed and well-nourished. No distress. Neck: no JVD, normal range of motion CVS: RRR, S1/S2 +, no  murmurs, no gallops, no carotid bruit.  Pulmonary: Effort and breath sounds normal, no stridor, rhonchi, wheezes, rales.  Abdominal: Soft. BS +,  no distension, tenderness, rebound or guarding.  Musculoskeletal: L shoulder ROM limited; forward elevation limited to 70 degrees.R shoulder is normal Lymphadenopathy: No lymphadenopathy noted, cervical, inguinal or axillary Neuro: Alert. Normal reflexes, muscle tone coordination. No cranial nerve deficit. Skin: Skin is warm and  dry. No rash noted. Not diaphoretic. No erythema. No pallor. Psychiatric: Normal mood and affect. Behavior, judgment, thought content normal.  CMP Latest Ref Rng 02/03/2015 01/25/2015 01/16/2015  Glucose 65 - 99 mg/dL 107(H) 168(H) 116(H)  BUN 7 - 25 mg/dL '11 12 9  '$ Creatinine 0.50 - 0.99 mg/dL 0.87 0.92 0.93  Sodium 135 - 146 mmol/L 139 137 142  Potassium 3.5 - 5.3 mmol/L 4.4 2.7(LL) 3.7  Chloride 98 - 110 mmol/L 99 95(L) 109  CO2 20 - 31 mmol/L '27 28 24  '$ Calcium 8.6 - 10.4 mg/dL 10.5(H) 10.5(H) 10.3  Total Protein 6.0 - 8.3 g/dL - - -  Total Bilirubin 0.3 - 1.2 mg/dL - - -  Alkaline Phos 39 - 117 U/L - - -  AST 0 - 37 U/L - - -  ALT 0 - 35 U/L - - -     Lipid Panel     Component Value Date/Time   CHOL 223* 03/16/2015 1230   TRIG 50 03/16/2015 1230   HDL 77 03/16/2015 1230   CHOLHDL 2.9 03/16/2015 1230   VLDL 10 03/16/2015 1230   LDLCALC 136* 03/16/2015 1230     Lab Results  Component Value Date   HGBA1C 6.40 07/30/2015    Assessment & Plan:   1. Essential hypertension Uncontrolled because she took her antihypertensive not long ago; BP at her last office visit was 134/65 Low-sodium, DASH diet - NIFEdipine (ADALAT CC) 90 MG 24 hr tablet; Take 1 tablet (90 mg total) by mouth daily.  Dispense: 30 tablet; Refill: 2 - carvedilol (COREG) 12.5 MG tablet; Take 1 tablet (12.5 mg total) by mouth 2 (two) times daily with a meal.  Dispense: 60 tablet; Refill: 3 - COMPLETE METABOLIC PANEL WITH GFR  2. Hyperlipidemia Noncompliant with statin and so if lipids are elevated I will make no changes to regimen. Encouraged compliance, low-cholesterol diet - atorvastatin (LIPITOR) 20 MG tablet; Take 1 tablet (20 mg total) by mouth daily.  Dispense: 30 tablet; Refill: 3 - Lipid panel   3. Neuropathy ([place]) Possible etiologies include positioning during sleep She does have gabapentin but has not been taking it - gabapentin (NEURONTIN) 300 MG capsule; Take 1 capsule (300 mg total) by mouth  2 (two) times daily.  Dispense: 60 capsule; Refill: 3  5. Pre-diabetes Last A1c was 6.4, three months ago Discussed dietary modifications to prevent development of diabetes mellitus - HgB A1c  Complete physical exam at next office visit  Meds ordered this encounter  Medications  . NIFEdipine (ADALAT CC) 90 MG 24 hr tablet    Sig: Take 1 tablet (90 mg total) by mouth daily.    Dispense:  30 tablet    Refill:  2    Discontinue Amlodipine  . atorvastatin (LIPITOR) 20 MG tablet    Sig: Take 1 tablet (20 mg total) by mouth daily.    Dispense:  30 tablet    Refill:  3  . carvedilol (COREG) 12.5 MG tablet    Sig: Take 1 tablet (12.5 mg total) by mouth 2 (two) times daily with a meal.  Dispense:  60 tablet    Refill:  3  . gabapentin (NEURONTIN) 300 MG capsule    Sig: Take 1 capsule (300 mg total) by mouth 2 (two) times daily.    Dispense:  60 capsule    Refill:  3    Follow-up: Return in about 2 weeks (around 11/24/2015) for Complete physical exam.   Arnoldo Morale MD

## 2015-11-10 NOTE — Patient Instructions (Signed)

## 2015-11-11 ENCOUNTER — Telehealth: Payer: Self-pay | Admitting: Family Medicine

## 2015-11-11 NOTE — Telephone Encounter (Signed)
Patient has been scheduled for lab appt.

## 2015-11-12 ENCOUNTER — Ambulatory Visit: Payer: PPO | Attending: Family Medicine

## 2015-11-12 ENCOUNTER — Encounter (INDEPENDENT_AMBULATORY_CARE_PROVIDER_SITE_OTHER): Payer: Self-pay

## 2015-11-12 DIAGNOSIS — I10 Essential (primary) hypertension: Secondary | ICD-10-CM | POA: Diagnosis not present

## 2015-11-12 DIAGNOSIS — E119 Type 2 diabetes mellitus without complications: Secondary | ICD-10-CM | POA: Diagnosis not present

## 2015-11-12 LAB — LIPID PANEL
CHOL/HDL RATIO: 3 ratio (ref <=5.0)
Cholesterol: 210 mg/dL — ABNORMAL HIGH (ref 125–200)
HDL: 69 mg/dL (ref >=46)
LDL Cholesterol: 132 mg/dL — ABNORMAL HIGH (ref <130)
TRIGLYCERIDES: 46 mg/dL (ref <150)
VLDL: 9 mg/dL (ref <30)

## 2015-11-12 LAB — HEMOGLOBIN A1C
Hgb A1c MFr Bld: 6.5 % — ABNORMAL HIGH (ref <5.7)
Mean Plasma Glucose: 140 mg/dL

## 2015-11-15 ENCOUNTER — Telehealth: Payer: Self-pay

## 2015-11-15 ENCOUNTER — Encounter: Payer: Self-pay | Admitting: Family Medicine

## 2015-11-15 NOTE — Telephone Encounter (Signed)
-----   Message from Arnoldo Morale, MD sent at 11/15/2015  1:10 PM EDT ----- Labs reveal A1C trending up from 6.4 to 6.5 indicating a diagnosis of Diabetes mellitus. She is advised to work on exercise and lifestyle changes. We will further discuss the diagnosis at her next visit.

## 2015-11-15 NOTE — Telephone Encounter (Signed)
Placed call to patient, patient verified name and DOB. Patient was given lab results verbalize understanding with no further questions.

## 2016-01-25 ENCOUNTER — Encounter: Payer: Self-pay | Admitting: Family Medicine

## 2016-01-25 ENCOUNTER — Other Ambulatory Visit (HOSPITAL_COMMUNITY)
Admission: RE | Admit: 2016-01-25 | Discharge: 2016-01-25 | Disposition: A | Discharge status: Home / Self Care | Payer: PPO | Source: Ambulatory Visit | Attending: Family Medicine | Admitting: Family Medicine

## 2016-01-25 ENCOUNTER — Ambulatory Visit: Payer: PPO | Attending: Family Medicine | Admitting: Family Medicine

## 2016-01-25 DIAGNOSIS — E785 Hyperlipidemia, unspecified: Secondary | ICD-10-CM

## 2016-01-25 DIAGNOSIS — Z1239 Encounter for other screening for malignant neoplasm of breast: Secondary | ICD-10-CM

## 2016-01-25 DIAGNOSIS — Z13820 Encounter for screening for osteoporosis: Secondary | ICD-10-CM

## 2016-01-25 DIAGNOSIS — I10 Essential (primary) hypertension: Secondary | ICD-10-CM

## 2016-01-25 DIAGNOSIS — Z23 Encounter for immunization: Secondary | ICD-10-CM | POA: Diagnosis not present

## 2016-01-25 DIAGNOSIS — Z124 Encounter for screening for malignant neoplasm of cervix: Secondary | ICD-10-CM | POA: Diagnosis not present

## 2016-01-25 DIAGNOSIS — Z0000 Encounter for general adult medical examination without abnormal findings: Secondary | ICD-10-CM

## 2016-01-25 DIAGNOSIS — Z1151 Encounter for screening for human papillomavirus (HPV): Secondary | ICD-10-CM | POA: Insufficient documentation

## 2016-01-25 DIAGNOSIS — Z01419 Encounter for gynecological examination (general) (routine) without abnormal findings: Secondary | ICD-10-CM | POA: Diagnosis not present

## 2016-01-25 DIAGNOSIS — Z79899 Other long term (current) drug therapy: Secondary | ICD-10-CM | POA: Diagnosis not present

## 2016-01-25 DIAGNOSIS — E119 Type 2 diabetes mellitus without complications: Secondary | ICD-10-CM

## 2016-01-25 DIAGNOSIS — Z1159 Encounter for screening for other viral diseases: Secondary | ICD-10-CM

## 2016-01-25 MED ORDER — ATORVASTATIN CALCIUM 20 MG PO TABS
20.0000 mg | ORAL_TABLET | Freq: Every day | ORAL | 3 refills | Status: DC
Start: 2016-01-25 — End: 2016-07-10

## 2016-01-25 MED ORDER — NIFEDIPINE ER 90 MG PO TB24: 90.0000 mg | ORAL_TABLET | Freq: Every day | ORAL | 3 refills | Status: DC

## 2016-01-25 MED ORDER — CARVEDILOL 12.5 MG PO TABS: 12.5000 mg | ORAL_TABLET | Freq: Two times a day (BID) | ORAL | 3 refills | Status: DC

## 2016-01-25 NOTE — Progress Notes (Signed)
Subjective:  Patient ID: Lisa Taylor, female    DOB: 1950/11/30  Age: 65 y.o. MRN: [id number]  CC: Diabetes and Hypertension   HPI Lisa Taylor presents for A complete physical exam. She is currently under a lot of stress she is trying to obtain housing through [employer] and is on a limited income; she denies depression and would like to work through the situation on her own.  Past Medical History:  Diagnosis Date  . Hypertension     History reviewed. No pertinent surgical history.    Outpatient Medications Prior to Visit  Medication Sig Dispense Refill  . aspirin (EQ ASPIRIN LOW DOSE) 81 MG chewable tablet CHEW AND SWALLOW ONE TABLET BY MOUTH ONCE DAILY 30 tablet 3  . atorvastatin (LIPITOR) 20 MG tablet Take 1 tablet (20 mg total) by mouth daily. 30 tablet 3  . carvedilol (COREG) 12.5 MG tablet Take 1 tablet (12.5 mg total) by mouth 2 (two) times daily with a meal. 60 tablet 3  . NIFEdipine (ADALAT CC) 90 MG 24 hr tablet Take 1 tablet (90 mg total) by mouth daily. 30 tablet 2  . cyclobenzaprine (FLEXERIL) 10 MG tablet Take 1 tablet (10 mg total) by mouth 2 (two) times daily as needed for muscle spasms. (Patient not taking: Reported on 01/25/2016) 30 tablet 1  . hydrocortisone 2.5 % cream Apply topically 2 (two) times daily. Apply to legs 2 times a day. Dispense 30 grams (Patient not taking: Reported on 01/25/2016) 30 g 3  . traMADol (ULTRAM) 50 MG tablet Take 1 tablet (50 mg total) by mouth every 12 (twelve) hours as needed. (Patient not taking: Reported on 01/25/2016) 30 tablet 0  . gabapentin (NEURONTIN) 300 MG capsule Take 1 capsule (300 mg total) by mouth 2 (two) times daily. (Patient not taking: Reported on 01/25/2016) 60 capsule 3   Facility-Administered Medications Prior to Visit  Medication Dose Route Frequency Provider Last Rate Last Dose  . NON FORMULARY 40 mg  40 mg Oral Once Arnoldo Morale, MD        ROS Review of Systems  Constitutional: Negative for  activity change, appetite change and fatigue.  HENT: Negative for congestion, sinus pressure and sore throat.   Eyes: Negative for visual disturbance.  Respiratory: Negative for cough, chest tightness, shortness of breath and wheezing.   Cardiovascular: Negative for chest pain and palpitations.  Gastrointestinal: Negative for abdominal distention, abdominal pain and constipation.  Endocrine: Negative for polydipsia.  Genitourinary: Negative for dysuria and frequency.  Musculoskeletal: Negative for arthralgias and back pain.  Skin: Negative for rash.  Neurological: Negative for tremors, light-headedness and numbness.  Hematological: Does not bruise/bleed easily.  Psychiatric/Behavioral: Negative for agitation and behavioral problems.    Objective:  BP (!) 144/68 (BP Location: Left Arm, Patient Position: Sitting, Cuff Size: Small)   Pulse (!) 54   Temp 98.1 F (36.7 C) (Oral)   Ht 5' (1.524 m)   Wt 125 lb 12.8 oz (57.1 kg)   SpO2 100%   BMI 24.57 kg/m   BP/Weight 01/25/2016 6/76/1951 03/05/2670  Systolic BP 245 809 983  Diastolic BP 68 80 65  Wt. (Lbs) 125.8 127.8 130  BMI 24.57 24.16 25.39      Physical Exam  Constitutional: She is oriented to person, place, and time. She appears well-developed and well-nourished. No distress.  HENT:  Head: Normocephalic.  Right Ear: External ear normal.  Left Ear: External ear normal.  Nose: Nose normal.  Mouth/Throat: Oropharynx  is clear and moist.  Eyes: Conjunctivae and EOM are normal. Pupils are equal, round, and reactive to light.  Neck: Normal range of motion. No JVD present.  Cardiovascular: Normal rate, regular rhythm, normal heart sounds and intact distal pulses.  Exam reveals no gallop.   No murmur heard. Pulmonary/Chest: Effort normal and breath sounds normal. No respiratory distress. She has no wheezes. She has no rales. She exhibits no tenderness. Right breast exhibits tenderness. Right breast exhibits no mass and no skin  change. Left breast exhibits skin change and tenderness. Left breast exhibits no mass.  Abdominal: Soft. Bowel sounds are normal. She exhibits no distension and no mass. There is no tenderness.  Genitourinary: Vagina normal.  Genitourinary Comments: Normal cervix, normal adnexa  Musculoskeletal: Normal range of motion. She exhibits no edema or tenderness.  Neurological: She is alert and oriented to person, place, and time. She has normal reflexes.  Skin: Skin is warm and dry. She is not diaphoretic.  Psychiatric: She has a normal mood and affect.     Assessment & Plan:   1. Routine general medical examination at a health care facility She has not had a Pap smear in a while and Pap smear performed today She will no longer need Pap smears after today's visit as per guidelines - Cytology - PAP ([place])  2. Screening for breast cancer - Mammogram Digital Screening; Future  3. Screening for cervical cancer - Cytology - PAP ([place])  4. Screening for osteoporosis - DEXAScan; Future  5. Need for Tdap vaccination - Tdap vaccine greater than or equal to 7yo IM  6. Screening for viral disease - Hepatitis C antibody screen - HIV antibody  7. Type 2 diabetes mellitus without complication, without long-term current use of insulin (HCC) A1c 6.5 Foot exam performed today Encouraged to schedule an annual eye exam. - Microalbumin / creatinine urine ratio - Pneumococcal polysaccharide vaccine 23-valent greater than or equal to 2yo subcutaneous/IM   No orders of the defined types were placed in this encounter.   Follow-up: Return in 3 months (on 04/26/2016) for follow up on hypertension.   Arnoldo Morale MD

## 2016-01-25 NOTE — Patient Instructions (Signed)
Health Maintenance, Female Adopting a healthy lifestyle and getting preventive care can go a long way to promote health and wellness. Talk with your health care provider about what schedule of regular examinations is right for you. This is a good chance for you to check in with your provider about disease prevention and staying healthy. In between checkups, there are plenty of things you can do on your own. Experts have done a lot of research about which lifestyle changes and preventive measures are most likely to keep you healthy. Ask your health care provider for more information. WEIGHT AND DIET  Eat a healthy diet  Be sure to include plenty of vegetables, fruits, low-fat dairy products, and lean protein.  Do not eat a lot of foods high in solid fats, added sugars, or salt.  Get regular exercise. This is one of the most important things you can do for your health.  Most adults should exercise for at least 150 minutes each week. The exercise should increase your heart rate and make you sweat (moderate-intensity exercise).  Most adults should also do strengthening exercises at least twice a week. This is in addition to the moderate-intensity exercise.  Maintain a healthy weight  Body mass index (BMI) is a measurement that can be used to identify possible weight problems. It estimates body fat based on height and weight. Your health care provider can help determine your BMI and help you achieve or maintain a healthy weight.  For females 28 years of age and older:   A BMI below 18.5 is considered underweight.  A BMI of 18.5 to 24.9 is normal.  A BMI of 25 to 29.9 is considered overweight.  A BMI of 30 and above is considered obese.  Watch levels of cholesterol and blood lipids  You should start having your blood tested for lipids and cholesterol at 66 years of age, then have this test every 5 years.  You may need to have your cholesterol levels checked more often if:  Your lipid  or cholesterol levels are high.  You are older than 66 years of age.  You are at high risk for heart disease.  CANCER SCREENING   Lung Cancer  Lung cancer screening is recommended for adults 75-66 years old who are at high risk for lung cancer because of a history of smoking.  A yearly low-dose CT scan of the lungs is recommended for people who:  Currently smoke.  Have quit within the past 15 years.  Have at least a 30-pack-year history of smoking. A pack year is smoking an average of one pack of cigarettes a day for 1 year.  Yearly screening should continue until it has been 15 years since you quit.  Yearly screening should stop if you develop a health problem that would prevent you from having lung cancer treatment.  Breast Cancer  Practice breast self-awareness. This means understanding how your breasts normally appear and feel.  It also means doing regular breast self-exams. Let your health care provider know about any changes, no matter how small.  If you are in your 20s or 30s, you should have a clinical breast exam (CBE) by a health care provider every 1-3 years as part of a regular health exam.  If you are 25 or older, have a CBE every year. Also consider having a breast X-ray (mammogram) every year.  If you have a family history of breast cancer, talk to your health care provider about genetic screening.  If you  are at high risk for breast cancer, talk to your health care provider about having an MRI and a mammogram every year.  Breast cancer gene (BRCA) assessment is recommended for women who have family members with BRCA-related cancers. BRCA-related cancers include:  Breast.  Ovarian.  Tubal.  Peritoneal cancers.  Results of the assessment will determine the need for genetic counseling and BRCA1 and BRCA2 testing. Cervical Cancer Your health care provider may recommend that you be screened regularly for cancer of the pelvic organs (ovaries, uterus, and  vagina). This screening involves a pelvic examination, including checking for microscopic changes to the surface of your cervix (Pap test). You may be encouraged to have this screening done every 3 years, beginning at age 21.  For women ages 30-65, health care providers may recommend pelvic exams and Pap testing every 3 years, or they may recommend the Pap and pelvic exam, combined with testing for human papilloma virus (HPV), every 5 years. Some types of HPV increase your risk of cervical cancer. Testing for HPV may also be done on women of any age with unclear Pap test results.  Other health care providers may not recommend any screening for nonpregnant women who are considered low risk for pelvic cancer and who do not have symptoms. Ask your health care provider if a screening pelvic exam is right for you.  If you have had past treatment for cervical cancer or a condition that could lead to cancer, you need Pap tests and screening for cancer for at least 20 years after your treatment. If Pap tests have been discontinued, your risk factors (such as having a new sexual partner) need to be reassessed to determine if screening should resume. Some women have medical problems that increase the chance of getting cervical cancer. In these cases, your health care provider may recommend more frequent screening and Pap tests. Colorectal Cancer  This type of cancer can be detected and often prevented.  Routine colorectal cancer screening usually begins at 66 years of age and continues through 66 years of age.  Your health care provider may recommend screening at an earlier age if you have risk factors for colon cancer.  Your health care provider may also recommend using home test kits to check for hidden blood in the stool.  A small camera at the end of a tube can be used to examine your colon directly (sigmoidoscopy or colonoscopy). This is done to check for the earliest forms of colorectal  cancer.  Routine screening usually begins at age 50.  Direct examination of the colon should be repeated every 5-10 years through 66 years of age. However, you may need to be screened more often if early forms of precancerous polyps or small growths are found. Skin Cancer  Check your skin from head to toe regularly.  Tell your health care provider about any new moles or changes in moles, especially if there is a change in a mole's shape or color.  Also tell your health care provider if you have a mole that is larger than the size of a pencil eraser.  Always use sunscreen. Apply sunscreen liberally and repeatedly throughout the day.  Protect yourself by wearing long sleeves, pants, a wide-brimmed hat, and sunglasses whenever you are outside. HEART DISEASE, DIABETES, AND HIGH BLOOD PRESSURE   High blood pressure causes heart disease and increases the risk of stroke. High blood pressure is more likely to develop in:  People who have blood pressure in the high end   of the normal range (130-139/85-89 mm Hg).  People who are overweight or obese.  People who are African American.  If you are 38-23 years of age, have your blood pressure checked every 3-5 years. If you are 61 years of age or older, have your blood pressure checked every year. You should have your blood pressure measured twice--once when you are at a hospital or clinic, and once when you are not at a hospital or clinic. Record the average of the two measurements. To check your blood pressure when you are not at a hospital or clinic, you can use:  An automated blood pressure machine at a pharmacy.  A home blood pressure monitor.  If you are between 45 years and 39 years old, ask your health care provider if you should take aspirin to prevent strokes.  Have regular diabetes screenings. This involves taking a blood sample to check your fasting blood sugar level.  If you are at a normal weight and have a low risk for diabetes,  have this test once every three years after 66 years of age.  If you are overweight and have a high risk for diabetes, consider being tested at a younger age or more often. PREVENTING INFECTION  Hepatitis B  If you have a higher risk for hepatitis B, you should be screened for this virus. You are considered at high risk for hepatitis B if:  You were born in a country where hepatitis B is common. Ask your health care provider which countries are considered high risk.  Your parents were born in a high-risk country, and you have not been immunized against hepatitis B (hepatitis B vaccine).  You have HIV or AIDS.  You use needles to inject street drugs.  You live with someone who has hepatitis B.  You have had sex with someone who has hepatitis B.  You get hemodialysis treatment.  You take certain medicines for conditions, including cancer, organ transplantation, and autoimmune conditions. Hepatitis C  Blood testing is recommended for:  Everyone born from [birth year] through 1966.  Anyone with known risk factors for hepatitis C. Sexually transmitted infections (STIs)  You should be screened for sexually transmitted infections (STIs) including gonorrhea and chlamydia if:  You are sexually active and are younger than 66 years of age.  You are older than 66 years of age and your health care provider tells you that you are at risk for this type of infection.  Your sexual activity has changed since you were last screened and you are at an increased risk for chlamydia or gonorrhea. Ask your health care provider if you are at risk.  If you do not have HIV, but are at risk, it may be recommended that you take a prescription medicine daily to prevent HIV infection. This is called pre-exposure prophylaxis (PrEP). You are considered at risk if:  You are sexually active and do not regularly use condoms or know the HIV status of your partner(s).  You take drugs by injection.  You are sexually  active with a partner who has HIV. Talk with your health care provider about whether you are at high risk of being infected with HIV. If you choose to begin PrEP, you should first be tested for HIV. You should then be tested every 3 months for as long as you are taking PrEP.  PREGNANCY   If you are premenopausal and you may become pregnant, ask your health care provider about preconception counseling.  If you may  become pregnant, take 400 to 800 micrograms (mcg) of folic acid every day.  If you want to prevent pregnancy, talk to your health care provider about birth control (contraception). OSTEOPOROSIS AND MENOPAUSE   Osteoporosis is a disease in which the bones lose minerals and strength with aging. This can result in serious bone fractures. Your risk for osteoporosis can be identified using a bone density scan.  If you are 61 years of age or older, or if you are at risk for osteoporosis and fractures, ask your health care provider if you should be screened.  Ask your health care provider whether you should take a calcium or vitamin D supplement to lower your risk for osteoporosis.  Menopause may have certain physical symptoms and risks.  Hormone replacement therapy may reduce some of these symptoms and risks. Talk to your health care provider about whether hormone replacement therapy is right for you.  HOME CARE INSTRUCTIONS   Schedule regular health, dental, and eye exams.  Stay current with your immunizations.   Do not use any tobacco products including cigarettes, chewing tobacco, or electronic cigarettes.  If you are pregnant, do not drink alcohol.  If you are breastfeeding, limit how much and how often you drink alcohol.  Limit alcohol intake to no more than 1 drink per day for nonpregnant women. One drink equals 12 ounces of beer, 5 ounces of wine, or 1 ounces of hard liquor.  Do not use street drugs.  Do not share needles.  Ask your health care provider for help if  you need support or information about quitting drugs.  Tell your health care provider if you often feel depressed.  Tell your health care provider if you have ever been abused or do not feel safe at home.   This information is not intended to replace advice given to you by your health care provider. Make sure you discuss any questions you have with your health care provider.   Document Released: 01/02/2011 Document Revised: 07/10/2014 Document Reviewed: 05/21/2013 Elsevier Interactive Patient Education [employer].

## 2016-01-26 ENCOUNTER — Other Ambulatory Visit: Payer: Self-pay | Admitting: Family Medicine

## 2016-01-26 LAB — CYTOLOGY - PAP

## 2016-01-27 ENCOUNTER — Other Ambulatory Visit: Payer: Self-pay | Admitting: Family Medicine

## 2016-02-03 ENCOUNTER — Telehealth: Payer: Self-pay | Admitting: Family Medicine

## 2016-02-03 ENCOUNTER — Telehealth: Payer: Self-pay

## 2016-02-03 NOTE — Telephone Encounter (Signed)
-----   Message from Arnoldo Morale, MD sent at 01/27/2016  9:18 AM EDT ----- Please inform her that her Pap smear results are normal.

## 2016-02-03 NOTE — Telephone Encounter (Signed)
Writer called patient per Dr. Jarold Song to discuss Pap smear results.  LVM and asked that patient call back to discuss.

## 2016-02-03 NOTE — Telephone Encounter (Signed)
Patient called to get lab results. Patient aware pap smear was normal

## 2016-02-03 NOTE — Telephone Encounter (Signed)
Referred to telephone encounter.

## 2016-02-09 NOTE — Telephone Encounter (Signed)
Pt called returning nurse's call to review results. Please f/up

## 2016-02-11 NOTE — Progress Notes (Signed)
Writer called patient and LVM that all her labs were normal and negative.  Patient encouraged to call back with questions.

## 2016-02-21 ENCOUNTER — Other Ambulatory Visit: Payer: Self-pay | Admitting: Family Medicine

## 2016-02-21 DIAGNOSIS — Z1231 Encounter for screening mammogram for malignant neoplasm of breast: Secondary | ICD-10-CM

## 2016-02-21 DIAGNOSIS — E2839 Other primary ovarian failure: Secondary | ICD-10-CM

## 2016-03-03 ENCOUNTER — Ambulatory Visit
Admission: RE | Admit: 2016-03-03 | Discharge: 2016-03-03 | Disposition: A | Discharge status: Home / Self Care | Payer: PPO | Source: Ambulatory Visit | Attending: Family Medicine | Admitting: Family Medicine

## 2016-03-03 DIAGNOSIS — Z780 Asymptomatic menopausal state: Secondary | ICD-10-CM | POA: Diagnosis not present

## 2016-03-03 DIAGNOSIS — M8589 Other specified disorders of bone density and structure, multiple sites: Secondary | ICD-10-CM | POA: Diagnosis not present

## 2016-03-03 DIAGNOSIS — E2839 Other primary ovarian failure: Secondary | ICD-10-CM

## 2016-03-03 DIAGNOSIS — Z1231 Encounter for screening mammogram for malignant neoplasm of breast: Secondary | ICD-10-CM

## 2016-03-15 ENCOUNTER — Telehealth: Payer: Self-pay

## 2016-03-15 NOTE — Telephone Encounter (Signed)
Writer called patient with results to her bone density test and mammogram.  It was recommended per MD that patient start on a calcium and vit d supplement for osteopenia.  Mammogram is normal. Patient stated understanding and will begin supplement immediately.

## 2016-03-15 NOTE — Telephone Encounter (Signed)
-----   Message from Arnoldo Morale, MD sent at 03/07/2016  2:19 PM EDT ----- Bone density reveals osteopenia which is a form of mild bone thinning. Advised to take OTC calcium and vitamin D.

## 2016-03-15 NOTE — Telephone Encounter (Signed)
-----   Message from Arnoldo Morale, MD sent at 03/07/2016  2:18 PM EDT ----- Mammogram is negative for malignancy

## 2016-03-15 NOTE — Telephone Encounter (Signed)
Refer to previous note.

## 2016-06-04 ENCOUNTER — Other Ambulatory Visit: Payer: Self-pay | Admitting: Family Medicine

## 2016-06-04 DIAGNOSIS — I10 Essential (primary) hypertension: Secondary | ICD-10-CM

## 2016-07-08 ENCOUNTER — Other Ambulatory Visit: Payer: Self-pay | Admitting: Family Medicine

## 2016-07-08 DIAGNOSIS — I10 Essential (primary) hypertension: Secondary | ICD-10-CM

## 2016-07-10 MED ORDER — ATORVASTATIN CALCIUM 20 MG PO TABS: 20.0000 mg | ORAL_TABLET | Freq: Every day | ORAL | 0 refills | Status: DC

## 2016-07-10 MED ORDER — CARVEDILOL 12.5 MG PO TABS: 12.5000 mg | ORAL_TABLET | Freq: Two times a day (BID) | ORAL | 0 refills | Status: DC

## 2016-07-10 MED ORDER — NIFEDIPINE ER 90 MG PO TB24: 90.0000 mg | ORAL_TABLET | Freq: Every day | ORAL | 0 refills | Status: DC

## 2016-07-10 NOTE — Addendum Note (Signed)
Addended by: Nicoletta Ba A on: 07/10/2016 11:17 AM   Modules accepted: Orders

## 2016-07-10 NOTE — Telephone Encounter (Signed)
Patient is requesting prescription refill for Carvedilol,Atorvastatin, and Nifedipine....patient has an appt. 1/16 with PCP  please follow up

## 2016-07-18 ENCOUNTER — Encounter: Payer: Self-pay | Admitting: Family Medicine

## 2016-07-18 ENCOUNTER — Ambulatory Visit: Payer: PPO | Attending: Family Medicine | Admitting: Family Medicine

## 2016-07-18 ENCOUNTER — Encounter: Payer: Self-pay | Admitting: Licensed Clinical Social Worker

## 2016-07-18 VITALS — BP 144/50 | HR 64 | Temp 97.7°F | Ht 62.0 in | Wt 119.8 lb

## 2016-07-18 DIAGNOSIS — F4321 Adjustment disorder with depressed mood: Secondary | ICD-10-CM | POA: Diagnosis not present

## 2016-07-18 DIAGNOSIS — R21 Rash and other nonspecific skin eruption: Secondary | ICD-10-CM | POA: Diagnosis not present

## 2016-07-18 DIAGNOSIS — K219 Gastro-esophageal reflux disease without esophagitis: Secondary | ICD-10-CM | POA: Diagnosis not present

## 2016-07-18 DIAGNOSIS — Z7982 Long term (current) use of aspirin: Secondary | ICD-10-CM | POA: Insufficient documentation

## 2016-07-18 DIAGNOSIS — E1100 Type 2 diabetes mellitus with hyperosmolarity without nonketotic hyperglycemic-hyperosmolar coma (NKHHC): Secondary | ICD-10-CM

## 2016-07-18 DIAGNOSIS — E7800 Pure hypercholesterolemia, unspecified: Secondary | ICD-10-CM | POA: Diagnosis not present

## 2016-07-18 DIAGNOSIS — I10 Essential (primary) hypertension: Secondary | ICD-10-CM | POA: Diagnosis not present

## 2016-07-18 LAB — POCT GLYCOSYLATED HEMOGLOBIN (HGB A1C): HEMOGLOBIN A1C: 6.2

## 2016-07-18 LAB — GLUCOSE, POCT (MANUAL RESULT ENTRY): POC GLUCOSE: 153 mg/dL — AB (ref 70–99)

## 2016-07-18 MED ORDER — CETAPHIL MOISTURIZING EX LOTN: 1.0000 "application " | TOPICAL_LOTION | CUTANEOUS | 0 refills | Status: AC | PRN

## 2016-07-18 MED ORDER — ASPIRIN 81 MG PO CHEW: CHEWABLE_TABLET | ORAL | 3 refills | Status: DC

## 2016-07-18 MED ORDER — ATORVASTATIN CALCIUM 20 MG PO TABS: 20.0000 mg | ORAL_TABLET | Freq: Every day | ORAL | 5 refills | Status: DC

## 2016-07-18 MED ORDER — CARVEDILOL 12.5 MG PO TABS: 12.5000 mg | ORAL_TABLET | Freq: Two times a day (BID) | ORAL | 5 refills | Status: DC

## 2016-07-18 MED ORDER — NIFEDIPINE ER 90 MG PO TB24: 90.0000 mg | ORAL_TABLET | Freq: Every day | ORAL | 5 refills | Status: DC

## 2016-07-18 NOTE — Social Work (Signed)
Session Start time: 9:45 AM   End Time: 10:05 AM Total Time:  20 minutes Type of Service: [place]: No.   Interpreter Name & Language: N/A # [facility] Visits July 2017-June 2018: 1st   SUBJECTIVE: Lisa Taylor is a 66 y.o. female  Pt. was referred by Dr. Jarold Song for:  anxiety, depression and community resources (housing). Pt. reports the following symptoms/concerns: overwhelming feelings of sadness and worry, difficulty sleeping, low energy and motivation, difficulty focusing, and irritability Duration of problem:  Ongoing Severity: severe Previous treatment: No report of previous treatment   OBJECTIVE: Mood: Anxious & Affect: Appropriate Risk of harm to self or others: Pt denied SI/HI Assessments administered: PHQ-9; GAD-7  LIFE CONTEXT:  Family & Social: Pt resides with adult daughter.  School/ Work: Pt receives [employer] farm [employer] Self-Care: Pt has difficulty sleeping. No report of substance use Life changes: Pt is interested in obtaining independent housing in the future. She currently resides with adult daughter What is important to pt/family (values): Family, Spirituality, and Independence   GOALS ADDRESSED:  Decrease symptoms of depression Decrease symptoms of anxiety  INTERVENTIONS: Solution Focused, Strength-based and Supportive   ASSESSMENT:  Pt currently experiencing depression and anxiety. Pt reports overwhelming feelings of sadness and worry, difficulty sleeping, low energy and motivation, difficulty focusing, and irritability. Pt may benefit from psychotherapy and medication management. [place] educated pt on how psychosocial stressors can negatively impact mental health. LCSWA educated pt on the cycle of depression and anxiety and discussed healthy coping skills to decrease symptoms. Pt identified coping strategies to apply on a daily basis. Pt reported that she plans to continue residing with daughter to save finances. LCSWA  provided community resources for obtaining stable housing, crisis interventions, psychotherapy, and medication management.     PLAN: 1. F/U with behavioral health clinician: Pt was encouraged to contact [place] if symptoms worsen or fail to improve to schedule behavioral appointments at [facility]. 2. Behavioral Health meds: None reported 3. Behavioral recommendations: LCSWA recommends that pt apply healthy coping skills discussed and utilize community resources, as needed. Pt is encouraged to schedule follow up appointment with LCSWA 4. Referral: Brief Counseling/Psychotherapy, [employer], Problem-solving teaching/coping strategies, Psychoeducation and Supportive Counseling 5. From scale of 1-10, how likely are you to follow plan: 7/10   Rebekah Chesterfield, MSW, [place] Worker 07/21/16 5:15 PM  Warmhandoff:   Warm Hand Off Completed.

## 2016-07-18 NOTE — Progress Notes (Signed)
Subjective:  Patient ID: Lisa Taylor, female    DOB: April 21, 1951  Age: 66 y.o. MRN: [id number]  CC: Hypertension; Rash (right leg); and Diabetes   HPI Lisa Taylor presents today for a follow-up on her chronic medical problems. She was last seen 5 months ago. under a lot of stress she is trying to obtain housing through section 8, as she is on a limited income from her retirement. She endorsed feeling depressed but denies any suicide ideation. She would like to talk with someone from the counseling department.She denies chest pain, shortness of breath, headache, or urinary symptoms.  Diabetes: Her diabetes is diet controlled. Hypertension: She report being compliant with taking her blood pressure medication. Her home BP log shows controlled readings but her office reading today is elevated.  Hyperlipidemia: This is uncontrolled. She report taking the prescribed medication. GERD: She denies any symptoms, symptom is currently contolled with life style modification   Past Medical History:  Diagnosis Date  . Hypertension       Diabetes     Hyperlipidemia     Gerd  History reviewed. No pertinent surgical history.  CMP Latest Ref Rng & Units 02/03/2015 01/25/2015 01/16/2015  Glucose 65 - 99 mg/dL 107(H) 168(H) 116(H)  BUN 7 - 25 mg/dL '11 12 9  '$ Creatinine 0.50 - 0.99 mg/dL 0.87 0.92 0.93  Sodium 135 - 146 mmol/L 139 137 142  Potassium 3.5 - 5.3 mmol/L 4.4 2.7(LL) 3.7  Chloride 98 - 110 mmol/L 99 95(L) 109  CO2 20 - 31 mmol/L '27 28 24  '$ Calcium 8.6 - 10.4 mg/dL 10.5(H) 10.5(H) 10.3  Total Protein 6.0 - 8.3 g/dL - - -  Total Bilirubin 0.3 - 1.2 mg/dL - - -  Alkaline Phos 39 - 117 U/L - - -  AST 0 - 37 U/L - - -  ALT 0 - 35 U/L - - -   Lipid Panel     Component Value Date/Time   CHOL 210 (H) 11/12/2015 0855   TRIG 46 11/12/2015 0855   HDL 69 11/12/2015 0855   CHOLHDL 3.0 11/12/2015 0855   VLDL 9 11/12/2015 0855   LDLCALC 132 (H) 11/12/2015 0855     ROS Review of  Systems  Constitutional: Negative for appetite change and fatigue.  HENT: Negative for congestion, sinus pressure and sore throat.   Eyes: Positive for visual disturbance (blurry vision).  Respiratory: Negative for cough, chest tightness, shortness of breath and wheezing.   Cardiovascular: Negative for chest pain and palpitations.  Gastrointestinal: Negative for abdominal distention, abdominal pain and constipation.  Endocrine: Negative for polydipsia.  Genitourinary: Negative for dysuria and frequency.  Musculoskeletal: Negative for arthralgias and back pain.  Skin: Positive for rash.  Neurological: Negative for tremors, light-headedness and numbness.  Hematological: Does not bruise/bleed easily.  Psychiatric/Behavioral: Negative for agitation and behavioral problems.    Objective:  BP (!) 144/50 (BP Location: Left Arm, Cuff Size: Normal)   Pulse 64   Temp 97.7 F (36.5 C) (Oral)   Ht 1.575 m ('5\' 2"'$ )   Wt 119 lb 12.8 oz (54.3 kg)   SpO2 99%   BMI 21.91 kg/m   BP/Weight 07/18/2016 01/25/2016 12/30/5282  Systolic BP 132 440 102  Diastolic BP 50 68 80  Wt. (Lbs) 119.8 125.8 127.8  BMI 21.91 24.57 24.16    HgbA1C 6.2 down from 6.5 on 01/25/16 Non-fasting CBG 153   Physical Exam  Constitutional: She is oriented to person, place, and time. She  appears well-developed and well-nourished. No distress.  HENT:  Head: Normocephalic.  Right Ear: External ear normal.  Left Ear: External ear normal.  Nose: Nose normal.  Mouth/Throat: Oropharynx is clear and moist.  Eyes: Conjunctivae are normal. Pupils are equal, round, and reactive to light. Right eye exhibits no discharge. Left eye exhibits no discharge.  Neck: Normal range of motion. No JVD present.  Cardiovascular: Normal rate, regular rhythm, normal heart sounds and intact distal pulses.  Exam reveals no gallop.   No murmur heard. Pulmonary/Chest: Effort normal and breath sounds normal. No respiratory distress. She has no  wheezes. She has no rales. She exhibits no tenderness. Right breast exhibits tenderness. Right breast exhibits no mass and no skin change. Left breast exhibits skin change and tenderness. Left breast exhibits no mass.  Abdominal: She exhibits no distension and no mass. There is no tenderness.  Musculoskeletal: Normal range of motion. She exhibits no edema or tenderness.  Neurological: She is alert and oriented to person, place, and time. She has normal reflexes.  Skin: Skin is warm and dry. Rash (diffused non-erythmatous dry raised areas on lateral right lower leg) noted. She is not diaphoretic.  Psychiatric: She has a normal mood and affect. Her behavior is normal. Thought content normal.     Assessment & Plan:  1. Type 2 diabetes mellitus with hyperosmolarity without coma, without long-term current use of insulin (HCC) Continue low carb diet - Calcium Carbonate-Vitamin D (CALCIUM 600+D) 600-400 MG-UNIT tablet; Take 1 tablet by mouth daily. - Glucose (CBG) - HgB A1c - Microalbumin, urine; Future - Ambulatory referral to Ophthalmology  2. Essential hypertension Home BP log shows controled BP in the 120s, no change to meds today Continue low salt diet, do not add extra salt to meals - NIFEdipine (ADALAT CC) 90 MG 24 hr tablet; Take 1 tablet (90 mg total) by mouth daily.  Dispense: 30 tablet; Refill: 5 - carvedilol (COREG) 12.5 MG tablet; Take 1 tablet (12.5 mg total) by mouth 2 (two) times daily with a meal.  Dispense: 60 tablet; Refill: 5 - aspirin (EQ ASPIRIN LOW DOSE) 81 MG chewable tablet; CHEW AND SWALLOW ONE TABLET BY MOUTH ONCE DAILY  Dispense: 30 tablet; Refill: 3 - Comprehensive metabolic panel; Future  3. Pure hypercholesterolemia Low fat diet encouraged - Lipid Panel; Future - atorvastatin (LIPITOR) 20 MG tablet; Take 1 tablet (20 mg total) by mouth daily.  Dispense: 30 tablet; Refill: 5  4. Rash and nonspecific skin eruption Keep site moisturized, Cetaphil worked for her in  the past - cetaphil (CETAPHIL) lotion; Apply 1 application topically as needed for dry skin.  Dispense: 473 mL; Refill: 0  5. Gastroesophageal reflux disease without esophagitis Avoid spicy foods Do not eat 2 hours prior to bedtime  6. Situational Depression Stressed reduction strategies for discussed Referred to in-house counselor     Meds ordered this encounter  Medications  . NIFEdipine (ADALAT CC) 90 MG 24 hr tablet    Sig: Take 1 tablet (90 mg total) by mouth daily.    Dispense:  30 tablet    Refill:  5    Must have office visit for refills  . carvedilol (COREG) 12.5 MG tablet    Sig: Take 1 tablet (12.5 mg total) by mouth 2 (two) times daily with a meal.    Dispense:  60 tablet    Refill:  5  . aspirin (EQ ASPIRIN LOW DOSE) 81 MG chewable tablet    Sig: CHEW AND SWALLOW ONE TABLET  BY MOUTH ONCE DAILY    Dispense:  30 tablet    Refill:  3  . cetaphil (CETAPHIL) lotion    Sig: Apply 1 application topically as needed for dry skin.    Dispense:  473 mL    Refill:  0  . atorvastatin (LIPITOR) 20 MG tablet    Sig: Take 1 tablet (20 mg total) by mouth daily.    Dispense:  30 tablet    Refill:  5    Must have office visit for refills    Keep appointment with Opthalmoloist Labs pending Health maintenance reviewed Diet and exercise encouraged Continue all meds Follow up  in 3 months for diabetes mellitus and hypertension   Jari Favre, RN, BSN, AGNP-Student

## 2016-07-18 NOTE — Patient Instructions (Signed)
Diabetes Mellitus and Food It is important for you to manage your blood sugar (glucose) level. Your blood glucose level can be greatly affected by what you eat. Eating healthier foods in the appropriate amounts throughout the day at about the same time each day will help you control your blood glucose level. It can also help slow or prevent worsening of your diabetes mellitus. Healthy eating may even help you improve the level of your blood pressure and reach or maintain a healthy weight. General recommendations for healthful eating and cooking habits include:  Eating meals and snacks regularly. Avoid going long periods of time without eating to lose weight.  Eating a diet that consists mainly of plant-based foods, such as fruits, vegetables, nuts, legumes, and whole grains.  Using low-heat cooking methods, such as baking, instead of high-heat cooking methods, such as deep frying.  Work with your dietitian to make sure you understand how to use the Nutrition Facts information on food labels. How can food affect me? Carbohydrates Carbohydrates affect your blood glucose level more than any other type of food. Your dietitian will help you determine how many carbohydrates to eat at each meal and teach you how to count carbohydrates. Counting carbohydrates is important to keep your blood glucose at a healthy level, especially if you are using insulin or taking certain medicines for diabetes mellitus. Alcohol Alcohol can cause sudden decreases in blood glucose (hypoglycemia), especially if you use insulin or take certain medicines for diabetes mellitus. Hypoglycemia can be a life-threatening condition. Symptoms of hypoglycemia (sleepiness, dizziness, and disorientation) are similar to symptoms of having too much alcohol. If your health care provider has given you approval to drink alcohol, do so in moderation and use the following guidelines:  Women should not have more than one drink per day, and men  should not have more than two drinks per day. One drink is equal to: ? 12 oz of beer. ? 5 oz of wine. ? 1 oz of hard liquor.  Do not drink on an empty stomach.  Keep yourself hydrated. Have water, diet soda, or unsweetened iced tea.  Regular soda, juice, and other mixers might contain a lot of carbohydrates and should be counted.  What foods are not recommended? As you make food choices, it is important to remember that all foods are not the same. Some foods have fewer nutrients per serving than other foods, even though they might have the same number of calories or carbohydrates. It is difficult to get your body what it needs when you eat foods with fewer nutrients. Examples of foods that you should avoid that are high in calories and carbohydrates but low in nutrients include:  Trans fats (most processed foods list trans fats on the Nutrition Facts label).  Regular soda.  Juice.  Candy.  Sweets, such as cake, pie, doughnuts, and cookies.  Fried foods.  What foods can I eat? Eat nutrient-rich foods, which will nourish your body and keep you healthy. The food you should eat also will depend on several factors, including:  The calories you need.  The medicines you take.  Your weight.  Your blood glucose level.  Your blood pressure level.  Your cholesterol level.  You should eat a variety of foods, including:  Protein. ? Lean cuts of meat. ? Proteins low in saturated fats, such as fish, egg whites, and beans. Avoid processed meats.  Fruits and vegetables. ? Fruits and vegetables that may help control blood glucose levels, such as apples,   mangoes, and yams.  Dairy products. ? Choose fat-free or low-fat dairy products, such as milk, yogurt, and cheese.  Grains, bread, pasta, and rice. ? Choose whole grain products, such as multigrain bread, whole oats, and brown rice. These foods may help control blood pressure.  Fats. ? Foods containing healthful fats, such as  nuts, avocado, olive oil, canola oil, and fish.  Does everyone with diabetes mellitus have the same meal plan? Because every person with diabetes mellitus is different, there is not one meal plan that works for everyone. It is very important that you meet with a dietitian who will help you create a meal plan that is just right for you. This information is not intended to replace advice given to you by your health care provider. Make sure you discuss any questions you have with your health care provider. Document Released: 03/16/2005 Document Revised: 11/25/2015 Document Reviewed: 05/16/2013 Elsevier Interactive Patient Education  2017 Elsevier Inc.  

## 2016-07-21 ENCOUNTER — Other Ambulatory Visit: Payer: PPO

## 2016-08-01 ENCOUNTER — Ambulatory Visit: Payer: PPO | Attending: Family Medicine

## 2016-08-01 DIAGNOSIS — I10 Essential (primary) hypertension: Secondary | ICD-10-CM | POA: Insufficient documentation

## 2016-08-01 DIAGNOSIS — E1100 Type 2 diabetes mellitus with hyperosmolarity without nonketotic hyperglycemic-hyperosmolar coma (NKHHC): Secondary | ICD-10-CM | POA: Insufficient documentation

## 2016-08-01 DIAGNOSIS — E7800 Pure hypercholesterolemia, unspecified: Secondary | ICD-10-CM | POA: Diagnosis not present

## 2016-08-01 LAB — COMPREHENSIVE METABOLIC PANEL
ALBUMIN: 4.8 g/dL (ref 3.6–5.1)
ALT: 8 U/L (ref 6–29)
AST: 17 U/L (ref 10–35)
Alkaline Phosphatase: 82 U/L (ref 33–130)
BUN: 10 mg/dL (ref 7–25)
CALCIUM: 11 mg/dL — AB (ref 8.6–10.4)
CO2: 25 mmol/L (ref 20–31)
Chloride: 103 mmol/L (ref 98–110)
Creat: 0.93 mg/dL (ref 0.50–0.99)
Glucose, Bld: 130 mg/dL — ABNORMAL HIGH (ref 65–99)
Potassium: 4.2 mmol/L (ref 3.5–5.3)
Sodium: 139 mmol/L (ref 135–146)
TOTAL PROTEIN: 8.1 g/dL (ref 6.1–8.1)
Total Bilirubin: 0.5 mg/dL (ref 0.2–1.2)

## 2016-08-01 LAB — LIPID PANEL
CHOL/HDL RATIO: 2.2 ratio (ref <5.0)
CHOLESTEROL: 141 mg/dL (ref <200)
HDL: 65 mg/dL (ref >50)
LDL Cholesterol: 67 mg/dL (ref <100)
Triglycerides: 45 mg/dL (ref <150)
VLDL: 9 mg/dL (ref <30)

## 2016-08-02 LAB — MICROALBUMIN, URINE: Microalb, Ur: 21.3 mg/dL

## 2016-08-03 ENCOUNTER — Telehealth: Payer: Self-pay

## 2016-08-03 ENCOUNTER — Encounter: Payer: Self-pay | Admitting: Family Medicine

## 2016-08-03 DIAGNOSIS — H2513 Age-related nuclear cataract, bilateral: Secondary | ICD-10-CM | POA: Diagnosis not present

## 2016-08-03 DIAGNOSIS — H04123 Dry eye syndrome of bilateral lacrimal glands: Secondary | ICD-10-CM | POA: Diagnosis not present

## 2016-08-03 DIAGNOSIS — E119 Type 2 diabetes mellitus without complications: Secondary | ICD-10-CM | POA: Diagnosis not present

## 2016-08-03 DIAGNOSIS — H40013 Open angle with borderline findings, low risk, bilateral: Secondary | ICD-10-CM | POA: Diagnosis not present

## 2016-08-03 LAB — HM DIABETES EYE EXAM

## 2016-08-03 NOTE — Telephone Encounter (Signed)
Writer called and LVM requesting patient to call back to discuss lab results.

## 2016-08-03 NOTE — Telephone Encounter (Signed)
-----   Message from Arnoldo Morale, MD sent at 08/02/2016  2:16 PM EST ----- Her labs reveal a normal cholesterol however  She has hypercalcemia and I would like her to d/c her OTC calcium supplements

## 2016-08-05 ENCOUNTER — Other Ambulatory Visit: Payer: Self-pay | Admitting: Family Medicine

## 2016-08-05 DIAGNOSIS — I10 Essential (primary) hypertension: Secondary | ICD-10-CM

## 2016-08-07 ENCOUNTER — Telehealth: Payer: Self-pay | Admitting: Family Medicine

## 2016-08-07 DIAGNOSIS — E7800 Pure hypercholesterolemia, unspecified: Secondary | ICD-10-CM

## 2016-08-07 DIAGNOSIS — I10 Essential (primary) hypertension: Secondary | ICD-10-CM

## 2016-08-07 MED ORDER — ATORVASTATIN CALCIUM 20 MG PO TABS: 20.0000 mg | ORAL_TABLET | Freq: Every day | ORAL | 5 refills | Status: DC

## 2016-08-07 MED ORDER — CARVEDILOL 12.5 MG PO TABS: 12.5000 mg | ORAL_TABLET | Freq: Two times a day (BID) | ORAL | 5 refills | Status: DC

## 2016-08-07 MED ORDER — NIFEDIPINE ER 90 MG PO TB24: 90.0000 mg | ORAL_TABLET | Freq: Every day | ORAL | 5 refills | Status: DC

## 2016-08-07 MED ORDER — ASPIRIN 81 MG PO CHEW
CHEWABLE_TABLET | ORAL | 3 refills | Status: AC
Start: 2016-08-07 — End: ?

## 2016-08-07 NOTE — Telephone Encounter (Signed)
Patient called the office to request medication refills for  NIFEdipine (ADALAT CC) 90 MG 24 hr tablet  carvedilol (COREG) 12.5 MG tablet, aspirin (EQ ASPIRIN LOW DOSE) 81 MG chewable tablet and atorvastatin (LIPITOR) 20 MG tablet. Please send it to [facility] on [address].   Thank you.

## 2016-08-07 NOTE — Telephone Encounter (Signed)
Requested medications refilled 

## 2016-10-04 ENCOUNTER — Other Ambulatory Visit: Payer: Self-pay | Admitting: Family Medicine

## 2016-10-04 DIAGNOSIS — E7800 Pure hypercholesterolemia, unspecified: Secondary | ICD-10-CM

## 2016-10-26 ENCOUNTER — Encounter: Payer: Self-pay | Admitting: Family Medicine

## 2016-10-26 ENCOUNTER — Ambulatory Visit: Payer: PPO | Attending: Family Medicine | Admitting: Family Medicine

## 2016-10-26 VITALS — BP 145/65 | HR 65 | Temp 97.7°F | Ht 61.0 in | Wt 120.0 lb

## 2016-10-26 DIAGNOSIS — E7800 Pure hypercholesterolemia, unspecified: Secondary | ICD-10-CM

## 2016-10-26 DIAGNOSIS — G8929 Other chronic pain: Secondary | ICD-10-CM | POA: Diagnosis not present

## 2016-10-26 DIAGNOSIS — I10 Essential (primary) hypertension: Secondary | ICD-10-CM

## 2016-10-26 DIAGNOSIS — M25512 Pain in left shoulder: Secondary | ICD-10-CM | POA: Diagnosis not present

## 2016-10-26 DIAGNOSIS — E119 Type 2 diabetes mellitus without complications: Secondary | ICD-10-CM | POA: Diagnosis not present

## 2016-10-26 DIAGNOSIS — M7502 Adhesive capsulitis of left shoulder: Secondary | ICD-10-CM | POA: Insufficient documentation

## 2016-10-26 DIAGNOSIS — Z7982 Long term (current) use of aspirin: Secondary | ICD-10-CM | POA: Diagnosis not present

## 2016-10-26 DIAGNOSIS — E1100 Type 2 diabetes mellitus with hyperosmolarity without nonketotic hyperglycemic-hyperosmolar coma (NKHHC): Secondary | ICD-10-CM

## 2016-10-26 LAB — GLUCOSE, POCT (MANUAL RESULT ENTRY): POC Glucose: 227 mg/dl — AB (ref 70–99)

## 2016-10-26 LAB — POCT GLYCOSYLATED HEMOGLOBIN (HGB A1C): HEMOGLOBIN A1C: 6.3

## 2016-10-26 MED ORDER — CARVEDILOL 12.5 MG PO TABS: 12.5000 mg | ORAL_TABLET | Freq: Two times a day (BID) | ORAL | 1 refills | Status: DC

## 2016-10-26 MED ORDER — NIFEDIPINE ER 90 MG PO TB24: 90.0000 mg | ORAL_TABLET | Freq: Every day | ORAL | 1 refills | Status: DC

## 2016-10-26 MED ORDER — ATORVASTATIN CALCIUM 20 MG PO TABS: 20.0000 mg | ORAL_TABLET | Freq: Every day | ORAL | 1 refills | Status: DC

## 2016-10-26 MED ORDER — TRAMADOL HCL 50 MG PO TABS: 50.0000 mg | ORAL_TABLET | Freq: Two times a day (BID) | ORAL | 1 refills | Status: DC | PRN

## 2016-10-26 MED ORDER — GLUCOSE BLOOD VI STRP: ORAL_STRIP | 3 refills | Status: DC

## 2016-10-26 MED ORDER — ONETOUCH ULTRA SYSTEM W/DEVICE KIT: 1.0000 | PACK | Freq: Once | 0 refills | Status: AC

## 2016-10-26 MED ORDER — CYCLOBENZAPRINE HCL 10 MG PO TABS: 10.0000 mg | ORAL_TABLET | Freq: Two times a day (BID) | ORAL | 1 refills | Status: DC | PRN

## 2016-10-26 NOTE — Progress Notes (Signed)
Subjective:  Patient ID: Lisa Taylor, female    DOB: 04-Aug-1950  Age: 66 y.o. MRN: [id number]  CC: Hypertension and Diabetes   HPI Todd Argabright is a 66 year old female with a history of type 2 diabetes mellitus (diet controlled with A1c of 6.3), hypertension, chronic left shoulder pain who presents today for follow-up visit.   She has been compliant with a diabetic diet as well as low-sodium diet and is very active. She walks a lot for exercise. Occasionally has numbness in her fingers and was previously on gabapentin which was discontinued as she complained of pedal edema with this.  Her blood pressure is slightly elevated and she endorses taking her antihypertensive today. Denies chest pain or shortness of breath.  She has chronic left shoulder pain and has had left shoulder surgery about 30 years ago; her range of motion in her left shoulder is restricted and she takes tramadol for this pain.  Past Medical History:  Diagnosis Date  . Hypertension     History reviewed. No pertinent surgical history.  Allergies  Allergen Reactions  . Codeine Other (See Comments)    Nausea/vomitting; some dizziness     Outpatient Medications Prior to Visit  Medication Sig Dispense Refill  . aspirin (EQ ASPIRIN LOW DOSE) 81 MG chewable tablet CHEW AND SWALLOW ONE TABLET BY MOUTH ONCE DAILY 30 tablet 3  . Calcium Carbonate-Vitamin D (CALCIUM 600+D) 600-400 MG-UNIT tablet Take 1 tablet by mouth daily.    . hydrocortisone 2.5 % cream Apply topically 2 (two) times daily. Apply to legs 2 times a day. Dispense 30 grams 30 g 3  . atorvastatin (LIPITOR) 20 MG tablet Take 1 tablet (20 mg total) by mouth daily. 30 tablet 5  . carvedilol (COREG) 12.5 MG tablet Take 1 tablet (12.5 mg total) by mouth 2 (two) times daily with a meal. 60 tablet 5  . NIFEdipine (ADALAT CC) 90 MG 24 hr tablet Take 1 tablet (90 mg total) by mouth daily. 30 tablet 5  . cetaphil (CETAPHIL) lotion Apply 1  application topically as needed for dry skin. (Patient not taking: Reported on 10/26/2016) 473 mL 0  . cyclobenzaprine (FLEXERIL) 10 MG tablet Take 1 tablet (10 mg total) by mouth 2 (two) times daily as needed for muscle spasms. (Patient not taking: Reported on 07/18/2016) 30 tablet 1  . traMADol (ULTRAM) 50 MG tablet Take 1 tablet (50 mg total) by mouth every 12 (twelve) hours as needed. (Patient not taking: Reported on 07/18/2016) 30 tablet 0   Facility-Administered Medications Prior to Visit  Medication Dose Route Frequency Provider Last Rate Last Dose  . NON FORMULARY 40 mg  40 mg Oral Once Arnoldo Morale, MD        ROS Review of Systems  Constitutional: Negative for activity change, appetite change and fatigue.  HENT: Negative for congestion, sinus pressure and sore throat.   Eyes: Negative for visual disturbance.  Respiratory: Negative for cough, chest tightness, shortness of breath and wheezing.   Cardiovascular: Negative for chest pain and palpitations.  Gastrointestinal: Negative for abdominal distention, abdominal pain and constipation.  Endocrine: Negative for polydipsia.  Genitourinary: Negative for dysuria and frequency.  Musculoskeletal:       See hpi  Skin: Negative for rash.  Neurological: Positive for numbness. Negative for tremors and light-headedness.  Hematological: Does not bruise/bleed easily.  Psychiatric/Behavioral: Negative for agitation and behavioral problems.    Objective:  BP (!) 145/65 (BP Location: Right Arm, Patient Position: Sitting,  Cuff Size: Small)   Pulse 65   Temp 97.7 F (36.5 C) (Oral)   Ht '5\' 1"'$  (1.549 m)   Wt 120 lb (54.4 kg)   SpO2 97%   BMI 22.67 kg/m   BP/Weight 10/26/2016 07/18/2016 01/25/2034  Systolic BP 597 416 384  Diastolic BP 65 50 68  Wt. (Lbs) 120 119.8 125.8  BMI 22.67 21.91 24.57      Physical Exam  Constitutional: She is oriented to person, place, and time. She appears well-developed and well-nourished.  Neck: No JVD  present.  Cardiovascular: Normal rate, normal heart sounds and intact distal pulses.   No murmur heard. Pulmonary/Chest: Effort normal and breath sounds normal. She has no wheezes. She has no rales. She exhibits no tenderness.  Abdominal: Soft. Bowel sounds are normal. She exhibits no distension and no mass. There is no tenderness.  Musculoskeletal:  Right shoulder-normal Left shoulder- passive abduction restricted to 80. Slight tenderness on palpation  Neurological: She is alert and oriented to person, place, and time.  Skin: Skin is warm and dry.  Psychiatric: She has a normal mood and affect.    Lab Results  Component Value Date   HGBA1C 6.3 10/26/2016    CMP Latest Ref Rng & Units 08/01/2016 02/03/2015 01/25/2015  Glucose 65 - 99 mg/dL 130(H) 107(H) 168(H)  BUN 7 - 25 mg/dL '10 11 12  '$ Creatinine 0.50 - 0.99 mg/dL 0.93 0.87 0.92  Sodium 135 - 146 mmol/L 139 139 137  Potassium 3.5 - 5.3 mmol/L 4.2 4.4 2.7(LL)  Chloride 98 - 110 mmol/L 103 99 95(L)  CO2 20 - 31 mmol/L '25 27 28  '$ Calcium 8.6 - 10.4 mg/dL 11.0(H) 10.5(H) 10.5(H)  Total Protein 6.1 - 8.1 g/dL 8.1 - -  Total Bilirubin 0.2 - 1.2 mg/dL 0.5 - -  Alkaline Phos 33 - 130 U/L 82 - -  AST 10 - 35 U/L 17 - -  ALT 6 - 29 U/L 8 - -    Lipid Panel     Component Value Date/Time   CHOL 141 08/01/2016 1047   TRIG 45 08/01/2016 1047   HDL 65 08/01/2016 1047   CHOLHDL 2.2 08/01/2016 1047   VLDL 9 08/01/2016 1047   LDLCALC 67 08/01/2016 1047    Assessment & Plan:   1. Type 2 diabetes mellitus with hyperosmolarity without coma, without long-term current use of insulin (HCC) Diet controlled with A1c of 6.3 Up-to-date on eye exam and foot exam - Glucose (CBG) - Hemoglobin A1c - HgB A1c  2. Essential hypertension Slightly elevated Low-sodium diet - NIFEdipine (ADALAT CC) 90 MG 24 hr tablet; Take 1 tablet (90 mg total) by mouth daily.  Dispense: 90 tablet; Refill: 1 - carvedilol (COREG) 12.5 MG tablet; Take 1 tablet (12.5  mg total) by mouth 2 (two) times daily with a meal.  Dispense: 180 tablet; Refill: 1  3. Pain in joint of left shoulder Chronic left shoulder adhesive capsulitis - cyclobenzaprine (FLEXERIL) 10 MG tablet; Take 1 tablet (10 mg total) by mouth 2 (two) times daily as needed for muscle spasms.  Dispense: 90 tablet; Refill: 1  4. Pure hypercholesterolemia Controlled - atorvastatin (LIPITOR) 20 MG tablet; Take 1 tablet (20 mg total) by mouth daily.  Dispense: 90 tablet; Refill: 1   Meds ordered this encounter  Medications  . traMADol (ULTRAM) 50 MG tablet    Sig: Take 1 tablet (50 mg total) by mouth every 12 (twelve) hours as needed.    Dispense:  50 tablet  Refill:  1  . NIFEdipine (ADALAT CC) 90 MG 24 hr tablet    Sig: Take 1 tablet (90 mg total) by mouth daily.    Dispense:  90 tablet    Refill:  1  . cyclobenzaprine (FLEXERIL) 10 MG tablet    Sig: Take 1 tablet (10 mg total) by mouth 2 (two) times daily as needed for muscle spasms.    Dispense:  90 tablet    Refill:  1  . carvedilol (COREG) 12.5 MG tablet    Sig: Take 1 tablet (12.5 mg total) by mouth 2 (two) times daily with a meal.    Dispense:  180 tablet    Refill:  1  . atorvastatin (LIPITOR) 20 MG tablet    Sig: Take 1 tablet (20 mg total) by mouth daily.    Dispense:  90 tablet    Refill:  1    Follow-up: Return in about 3 months (around 01/25/2017) for Follow-up on hypertension and diabetes.   Arnoldo Morale MD

## 2016-10-26 NOTE — Progress Notes (Signed)
Med refills- flexeril and tramadol

## 2016-10-26 NOTE — Patient Instructions (Signed)

## 2016-12-11 ENCOUNTER — Other Ambulatory Visit: Payer: Self-pay | Admitting: Pharmacist

## 2016-12-11 DIAGNOSIS — E7800 Pure hypercholesterolemia, unspecified: Secondary | ICD-10-CM

## 2016-12-11 MED ORDER — ATORVASTATIN CALCIUM 20 MG PO TABS: 20.0000 mg | ORAL_TABLET | Freq: Every day | ORAL | 0 refills | Status: DC

## 2016-12-27 ENCOUNTER — Other Ambulatory Visit: Payer: Self-pay | Admitting: Pharmacist

## 2016-12-27 DIAGNOSIS — E7800 Pure hypercholesterolemia, unspecified: Secondary | ICD-10-CM

## 2016-12-27 MED ORDER — ATORVASTATIN CALCIUM 20 MG PO TABS: 20.0000 mg | ORAL_TABLET | Freq: Every day | ORAL | 0 refills | Status: DC

## 2017-01-30 ENCOUNTER — Ambulatory Visit: Payer: PPO | Attending: Family Medicine | Admitting: Family Medicine

## 2017-01-30 ENCOUNTER — Ambulatory Visit: Payer: PPO | Admitting: Licensed Clinical Social Worker

## 2017-01-30 ENCOUNTER — Encounter: Payer: Self-pay | Admitting: Family Medicine

## 2017-01-30 VITALS — BP 144/66 | HR 58 | Temp 97.8°F | Resp 18 | Ht 62.0 in | Wt 122.6 lb

## 2017-01-30 DIAGNOSIS — E1100 Type 2 diabetes mellitus with hyperosmolarity without nonketotic hyperglycemic-hyperosmolar coma (NKHHC): Secondary | ICD-10-CM

## 2017-01-30 DIAGNOSIS — F419 Anxiety disorder, unspecified: Principal | ICD-10-CM

## 2017-01-30 DIAGNOSIS — F32A Depression, unspecified: Secondary | ICD-10-CM

## 2017-01-30 DIAGNOSIS — I10 Essential (primary) hypertension: Secondary | ICD-10-CM

## 2017-01-30 DIAGNOSIS — M25512 Pain in left shoulder: Secondary | ICD-10-CM

## 2017-01-30 DIAGNOSIS — F329 Major depressive disorder, single episode, unspecified: Secondary | ICD-10-CM

## 2017-01-30 DIAGNOSIS — E7800 Pure hypercholesterolemia, unspecified: Secondary | ICD-10-CM

## 2017-01-30 MED ORDER — ATORVASTATIN CALCIUM 20 MG PO TABS
20.0000 mg | ORAL_TABLET | Freq: Every day | ORAL | 3 refills | Status: DC
Start: 2017-01-30 — End: 2017-05-10

## 2017-01-30 MED ORDER — LISINOPRIL 5 MG PO TABS: 5.0000 mg | ORAL_TABLET | Freq: Every day | ORAL | 3 refills | Status: DC

## 2017-01-30 NOTE — Progress Notes (Signed)
Subjective:    Patient ID: Lisa Taylor, female    DOB: September 17, 1950, 66 y.o.   MRN: [id number]  HPI She is a 66 year old female with a history of type 2 diabetes mellitus (diet controlled with A1c of 6.3), hypertension, chronic left shoulder pain who presents today for follow-up visit.   She has been compliant with a diabetic diet as well as low-sodium diet and is very active. She walks a lot for exercise. Occasionally has numbness in her fingers and was previously on gabapentin which was discontinued as she complained of pedal edema with this.  Her blood pressure is slightly elevated and she endorses taking her antihypertensive today; home blood pressure yesterday was 139/60. Denies chest pain or shortness of breath.  She has chronic left shoulder pain and has had left shoulder surgery about 30 years ago; her range of motion in her left shoulder is restricted and tramadol does not help much but she continues with Flexeril and feels she has to "deal with it".   Past Medical History:  Diagnosis Date  . Hypertension     No past surgical history on file.  Allergies  Allergen Reactions  . Codeine Other (See Comments)    Nausea/vomitting; some dizziness     Review of Systems Constitutional: Negative for activity change, appetite change and fatigue.  HENT: Negative for congestion, sinus pressure and sore throat.   Eyes: Negative for visual disturbance.  Respiratory: Negative for cough, chest tightness, shortness of breath and wheezing.   Cardiovascular: Negative for chest pain and palpitations.  Gastrointestinal: Negative for abdominal distention, abdominal pain and constipation.  Endocrine: Negative for polydipsia.  Genitourinary: Negative for dysuria and frequency.  Musculoskeletal:       See hpi  Skin: Negative for rash.  Neurological:  Negative for tremors and light-headedness.  Hematological: Does not bruise/bleed easily.  Psychiatric/Behavioral: Negative for  agitation and behavioral problems.     Objective: Vitals:   01/30/17 1041  BP: (!) 144/66  Pulse: (!) 58  Resp: 18  Temp: 97.8 F (36.6 C)  TempSrc: Oral  SpO2: 100%  Weight: 122 lb 9.6 oz (55.6 kg)  Height: 5\' 2"  (1.575 m)      Physical Exam Constitutional: She is oriented to person, place, and time. She appears well-developed and well-nourished.  Neck: No JVD present.  Cardiovascular: Normal rate, normal heart sounds and intact distal pulses.   No murmur heard. Pulmonary/Chest: Effort normal and breath sounds normal. She has no wheezes. She has no rales. She exhibits no tenderness.  Abdominal: Soft. Bowel sounds are normal. She exhibits no distension and no mass. There is no tenderness.  Musculoskeletal:  Right shoulder-normal Left shoulder- passive abduction restricted to 80. Slight tenderness on palpation  Neurological: She is alert and oriented to person, place, and time.  Skin: Skin is warm and dry.  Psychiatric: She has a normal mood and affect.    CMP Latest Ref Rng & Units 08/01/2016 02/03/2015 01/25/2015  Glucose 65 - 99 mg/dL 130(H) 107(H) 168(H)  BUN 7 - 25 mg/dL 10 11 12   Creatinine 0.50 - 0.99 mg/dL 0.93 0.87 0.92  Sodium 135 - 146 mmol/L 139 139 137  Potassium 3.5 - 5.3 mmol/L 4.2 4.4 2.7(LL)  Chloride 98 - 110 mmol/L 103 99 95(L)  CO2 20 - 31 mmol/L 25 27 28   Calcium 8.6 - 10.4 mg/dL 11.0(H) 10.5(H) 10.5(H)  Total Protein 6.1 - 8.1 g/dL 8.1 - -  Total Bilirubin 0.2 - 1.2 mg/dL 0.5 - -  Alkaline Phos 33 - 130 U/L 82 - -  AST 10 - 35 U/L 17 - -  ALT 6 - 29 U/L 8 - -    Lipid Panel     Component Value Date/Time   CHOL 141 08/01/2016 1047   TRIG 45 08/01/2016 1047   HDL 65 08/01/2016 1047   CHOLHDL 2.2 08/01/2016 1047   VLDL 9 08/01/2016 1047   LDLCALC 67 08/01/2016 1047    Lab Results  Component Value Date   HGBA1C 6.3 10/26/2016       Assessment & Plan:  1. Type 2 diabetes mellitus with hyperosmolarity without coma, without long-term current  use of insulin (HCC) Diet controlled with A1c of 6.3 Up-to-date on eye exam and foot exam - Glucose (CBG) - Hemoglobin A1c - HgB A1c  2. Essential hypertension Slightly elevated Low-sodium diet Lisinopril added to regimen - NIFEdipine (ADALAT CC) 90 MG 24 hr tablet; Take 1 tablet (90 mg total) by mouth daily.  Dispense: 90 tablet; Refill: 1 - carvedilol (COREG) 12.5 MG tablet; Take 1 tablet (12.5 mg total) by mouth 2 (two) times daily with a meal.  Dispense: 180 tablet; Refill: 1  3. Pain in joint of left shoulder Chronic left shoulder adhesive capsulitis - cyclobenzaprine (FLEXERIL) 10 MG tablet; Take 1 tablet (10 mg total) by mouth 2 (two) times daily as needed for muscle spasms.  Dispense: 90 tablet; Refill: 1  4. Pure hypercholesterolemia Controlled - atorvastatin (LIPITOR) 20 MG tablet; Take 1 tablet (20 mg total) by mouth daily.  Dispense: 90 tablet; Refill: 1   This note has been created with Surveyor, quantity. Any transcriptional errors are unintentional.

## 2017-01-30 NOTE — BH Specialist Note (Signed)
Integrated Behavioral Health Follow Up Visit  MRN: [id number] Name: Lisa Taylor   Session Start time: 11:15 AM Session End time: 11:45 AM Total time: 30 minutes Number of Integrated Behavioral Health Clinician visits: 2/10  Type of Service: [place] Interpretor:No. Interpretor Name and Language: N/A   Warm Hand Off Completed.       SUBJECTIVE: Lisa Taylor is a 66 y.o. female accompanied by patient. Patient was referred by Dr. Jarold Song for depression and anxiety. Patient reports the following symptoms/concerns: feeling sad and worried, difficulty sleeping, low energy, irritability, and suicidal ideations Duration of problem: Ongoing; Severity of problem: severe  OBJECTIVE: Mood: Pleasant and Affect: Appropriate Risk of harm to self or others: No plan to harm self or others   LIFE CONTEXT: Family and Social: Pt resides with adult daughter. She receives strong family support School/Work: Pt receives [employer] farm [employer] Self-Care: Pt exercises (walks) regularly and maintains a healthy diet to manage diabetes.  Life Changes: Pt is interested in obtaining independent housing in the future. She currently resides with adult daughter while she saves money.  GOALS ADDRESSED: Patient will reduce symptoms of: anxiety and depression and increase knowledge and/or ability of: coping skills and also: Increase adequate support systems for patient/family  INTERVENTIONS: Mindfulness or Relaxation Training, Supportive Counseling and Link to [employer] Standardized Assessments completed: GAD-7 and PHQ 2&9 with C-SSRS  ASSESSMENT: Pt currently experiencing depression and anxiety triggered by ongoing medical concerns. Pt reports feeling sad and worried, difficulty sleeping, low energy, irritability, and suicidal ideations. She denies current SI/HI/AVH. Pt may benefit from psychotherapy and medication management. LCSWA discussed protective  factors with patient and discussed importance of establishing a safety plan. Pt shared that she does not have a plan or intent to harm self or others. LCSWA taught mindfulness intervention and pt agreed to try to utilize on a routine basis to decrease stress.  PLAN: 1. Follow up with behavioral health clinician on : Pt was encouraged tocontact LCSWA if symptoms worsen or fail to improveto schedule behavioral appointments at [facility]. 2. Behavioral recommendations: LCSWA recommends that pt apply healthy coping skills discussed and utilize community resources, as needed. Pt is encouraged to schedule follow up appointment with LCSWA 3. Referral(s):  4. "From scale of 1-10, how likely are you to follow plan?": 8/10  Rebekah [place], LCSW 02/01/17 9:48 AM

## 2017-01-30 NOTE — Progress Notes (Signed)
Patient has not eaten  Patient has taken her medication

## 2017-01-30 NOTE — Patient Instructions (Signed)
Shoulder Pain Many things can cause shoulder pain, including:  An injury to the area.  Overuse of the shoulder.  Arthritis. The source of the pain can be:  Inflammation.  An injury to the shoulder joint.  An injury to a tendon, ligament, or bone. Follow these instructions at home: Take these actions to help with your pain:  Squeeze a soft ball or a foam pad as much as possible. This helps to keep the shoulder from swelling. It also helps to strengthen the arm.  Take over-the-counter and prescription medicines only as told by your health care provider.  If directed, apply ice to the area:  Put ice in a plastic bag.  Place a towel between your skin and the bag.  Leave the ice on for 20 minutes, 2-3 times per day. Stop applying ice if it does not help with the pain.  If you were given a shoulder sling or immobilizer:  Wear it as told.  Remove it to shower or bathe.  Move your arm as little as possible, but keep your hand moving to prevent swelling. Contact a health care provider if:  Your pain gets worse.  Your pain is not relieved with medicines.  New pain develops in your arm, hand, or fingers. Get help right away if:  Your arm, hand, or fingers:  Tingle.  Become numb.  Become swollen.  Become painful.  Turn white or blue. This information is not intended to replace advice given to you by your health care provider. Make sure you discuss any questions you have with your health care provider. Document Released: 03/29/2005 Document Revised: 02/13/2016 Document Reviewed: 10/12/2014 Elsevier Interactive Patient Education  2017 Elsevier Inc.  

## 2017-04-18 ENCOUNTER — Other Ambulatory Visit: Payer: Self-pay | Admitting: Family Medicine

## 2017-04-18 DIAGNOSIS — I10 Essential (primary) hypertension: Secondary | ICD-10-CM

## 2017-05-07 ENCOUNTER — Other Ambulatory Visit: Payer: Self-pay | Admitting: Family Medicine

## 2017-05-07 DIAGNOSIS — I10 Essential (primary) hypertension: Secondary | ICD-10-CM

## 2017-05-10 ENCOUNTER — Encounter: Payer: Self-pay | Admitting: Family Medicine

## 2017-05-10 ENCOUNTER — Ambulatory Visit: Payer: PPO | Attending: Family Medicine | Admitting: Family Medicine

## 2017-05-10 VITALS — BP 187/73 | HR 61 | Temp 98.2°F | Ht 62.0 in | Wt 115.8 lb

## 2017-05-10 DIAGNOSIS — E7800 Pure hypercholesterolemia, unspecified: Secondary | ICD-10-CM | POA: Insufficient documentation

## 2017-05-10 DIAGNOSIS — M25512 Pain in left shoulder: Secondary | ICD-10-CM | POA: Insufficient documentation

## 2017-05-10 DIAGNOSIS — E1100 Type 2 diabetes mellitus with hyperosmolarity without nonketotic hyperglycemic-hyperosmolar coma (NKHHC): Secondary | ICD-10-CM

## 2017-05-10 DIAGNOSIS — F5102 Adjustment insomnia: Secondary | ICD-10-CM

## 2017-05-10 DIAGNOSIS — G8929 Other chronic pain: Secondary | ICD-10-CM | POA: Diagnosis not present

## 2017-05-10 DIAGNOSIS — Z7982 Long term (current) use of aspirin: Secondary | ICD-10-CM | POA: Insufficient documentation

## 2017-05-10 DIAGNOSIS — I10 Essential (primary) hypertension: Secondary | ICD-10-CM

## 2017-05-10 DIAGNOSIS — Z634 Disappearance and death of family member: Secondary | ICD-10-CM | POA: Diagnosis not present

## 2017-05-10 DIAGNOSIS — Z79899 Other long term (current) drug therapy: Secondary | ICD-10-CM | POA: Insufficient documentation

## 2017-05-10 DIAGNOSIS — E119 Type 2 diabetes mellitus without complications: Secondary | ICD-10-CM | POA: Diagnosis present

## 2017-05-10 DIAGNOSIS — G4700 Insomnia, unspecified: Secondary | ICD-10-CM | POA: Diagnosis not present

## 2017-05-10 LAB — POCT GLYCOSYLATED HEMOGLOBIN (HGB A1C): Hemoglobin A1C: 6.4

## 2017-05-10 LAB — GLUCOSE, POCT (MANUAL RESULT ENTRY): POC GLUCOSE: 126 mg/dL — AB (ref 70–99)

## 2017-05-10 MED ORDER — NIFEDIPINE ER 90 MG PO TB24: 90.0000 mg | ORAL_TABLET | Freq: Every day | ORAL | 1 refills | Status: DC

## 2017-05-10 MED ORDER — MIRTAZAPINE 15 MG PO TABS: 15.0000 mg | ORAL_TABLET | Freq: Every day | ORAL | 3 refills | Status: DC

## 2017-05-10 MED ORDER — ATORVASTATIN CALCIUM 20 MG PO TABS: 20.0000 mg | ORAL_TABLET | Freq: Every day | ORAL | 3 refills | Status: DC

## 2017-05-10 MED ORDER — LISINOPRIL 5 MG PO TABS: 5.0000 mg | ORAL_TABLET | Freq: Every day | ORAL | 3 refills | Status: DC

## 2017-05-10 MED ORDER — CARVEDILOL 12.5 MG PO TABS: 12.5000 mg | ORAL_TABLET | Freq: Two times a day (BID) | ORAL | 3 refills | Status: DC

## 2017-05-10 NOTE — Patient Instructions (Signed)

## 2017-05-10 NOTE — Progress Notes (Signed)
Subjective:  Taylor ID: Lisa Taylor, female    DOB: 08-Dec-1950  Age: 66 y.o. MRN: [id number]  CC: Diabetes   HPI Lisa Taylor with a history of type 2 diabetes mellitus (diet controlled with A1c of 6.4), hypertension, chronic left shoulder pain who presents today for follow-up visit.  Her blood pressure is significantly elevated at 187/73 and she endorses compliance with her antihypertensive.  She informs me her blood pressure was 139/70 this morning prior to coming to the clinic. She endorses being stressed and in addition her sister passed away 5 days ago.  She is trying to be the best she can and taking it one day at a time. She endorses reduced appetite, insomnia but denies suicidal ideation or intent even though her PHQ 9 score is 18.  She has been compliant with her Lipitor and denies any myalgias from it.  Her diabetes is diet controlled and she is compliant with a regular exercise regimen. For her chronic left shoulder pain she uses a muscle relaxant-Flexeril in addition to over-the-counter Advil with some relief.  Symptoms are exacerbated by the cold weather.  Past Medical History:  Diagnosis Date  . Hypertension     History reviewed. No pertinent surgical history.  Allergies  Allergen Reactions  . Codeine Other (See Comments)    Nausea/vomitting; some dizziness     Outpatient Medications Prior to Visit  Medication Sig Dispense Refill  . aspirin (EQ ASPIRIN LOW DOSE) 81 MG chewable tablet CHEW AND SWALLOW ONE TABLET BY MOUTH ONCE DAILY 30 tablet 3  . Calcium Carbonate-Vitamin D (CALCIUM 600+D) 600-400 MG-UNIT tablet Take 1 tablet by mouth daily.    . cyclobenzaprine (FLEXERIL) 10 MG tablet Take 1 tablet (10 mg total) by mouth 2 (two) times daily as needed for muscle spasms. 90 tablet 1  . glucose blood (ONE TOUCH ULTRA TEST) test strip Use as instructed daily before breakfast 100 each 3  . hydrocortisone 2.5 % cream Apply topically 2 (two) times  daily. Apply to legs 2 times a day. Dispense 30 grams 30 g 3  . traMADol (ULTRAM) 50 MG tablet Take 1 tablet (50 mg total) by mouth every 12 (twelve) hours as needed. 50 tablet 1  . atorvastatin (LIPITOR) 20 MG tablet Take 1 tablet (20 mg total) by mouth daily. 30 tablet 3  . carvedilol (COREG) 12.5 MG tablet TAKE 1 TABLET BY MOUTH TWICE DAILY WITH MEALS 60 tablet 0  . lisinopril (PRINIVIL,ZESTRIL) 5 MG tablet Take 1 tablet (5 mg total) by mouth daily. 30 tablet 3  . NIFEdipine (ADALAT CC) 90 MG 24 hr tablet TAKE 1 TABLET BY MOUTH ONCE DAILY 90 tablet 0  . cetaphil (CETAPHIL) lotion Apply 1 application topically as needed for dry skin. (Taylor not taking: Reported on 10/26/2016) 473 mL 0   No facility-administered medications prior to visit.     ROS Review of Systems  Constitutional: Negative for activity change, appetite change and fatigue.  HENT: Negative for congestion, sinus pressure and sore throat.   Eyes: Negative for visual disturbance.  Respiratory: Negative for cough, chest tightness, shortness of breath and wheezing.   Cardiovascular: Negative for chest pain and palpitations.  Gastrointestinal: Negative for abdominal distention, abdominal pain and constipation.  Endocrine: Negative for polydipsia.  Genitourinary: Negative for dysuria and frequency.  Musculoskeletal:       See HPI  Skin: Negative for rash.  Neurological: Negative for tremors, light-headedness and numbness.  Hematological: Does not bruise/bleed easily.  Psychiatric/Behavioral: Negative for agitation and behavioral problems.    Objective:  BP (!) 187/73   Pulse 61   Temp 98.2 F (36.8 C) (Oral)   Ht 5\' 2"  (1.575 m)   Wt 115 lb 12.8 oz (52.5 kg)   SpO2 100%   BMI 21.18 kg/m   BP/Weight 05/10/2017 01/30/2017 8/93/7342  Systolic BP 876 811 572  Diastolic BP 73 66 65  Wt. (Lbs) 115.8 122.6 120  BMI 21.18 22.42 22.67      Physical Exam  Constitutional: She is oriented to person, place, and time. She  appears well-developed and well-nourished.  Cardiovascular: Normal rate, normal heart sounds and intact distal pulses.  No murmur heard. Pulmonary/Chest: Effort normal and breath sounds normal. She has no wheezes. She has no rales. She exhibits no tenderness.  Abdominal: Soft. Bowel sounds are normal. She exhibits no distension and no mass. There is no tenderness.  Musculoskeletal: Normal range of motion. She exhibits tenderness (Reduced passive abduction ).  Neurological: She is alert and oriented to person, place, and time.  Skin: Skin is warm and dry.  Psychiatric: She has a normal mood and affect.     Lab Results  Component Value Date   HGBA1C 6.4 05/10/2017    CMP Latest Ref Rng & Units 08/01/2016 02/03/2015 01/25/2015  Glucose 65 - 99 mg/dL 130(H) 107(H) 168(H)  BUN 7 - 25 mg/dL 10 11 12   Creatinine 0.50 - 0.99 mg/dL 0.93 0.87 0.92  Sodium 135 - 146 mmol/L 139 139 137  Potassium 3.5 - 5.3 mmol/L 4.2 4.4 2.7(LL)  Chloride 98 - 110 mmol/L 103 99 95(L)  CO2 20 - 31 mmol/L 25 27 28   Calcium 8.6 - 10.4 mg/dL 11.0(H) 10.5(H) 10.5(H)  Total Protein 6.1 - 8.1 g/dL 8.1 - -  Total Bilirubin 0.2 - 1.2 mg/dL 0.5 - -  Alkaline Phos 33 - 130 U/L 82 - -  AST 10 - 35 U/L 17 - -  ALT 6 - 29 U/L 8 - -    Lipid Panel     Component Value Date/Time   CHOL 141 08/01/2016 1047   TRIG 45 08/01/2016 1047   HDL 65 08/01/2016 1047   CHOLHDL 2.2 08/01/2016 1047   VLDL 9 08/01/2016 1047   LDLCALC 67 08/01/2016 1047    Assessment & Plan:   1. Type 2 diabetes mellitus with hyperosmolarity without coma, without long-term current use of insulin (HCC) Controlled with A1c of 6.4 Continue dietary modifications, lifestyle changes - POCT glucose (manual entry) - POCT glycosylated hemoglobin (Hb A1C)  2. Essential hypertension Uncontrolled Taylor states blood pressure was 139/70 at home Elevation could be attributed to stress Low sodium, DASH diet Will increase dose of regimen if still elevated  at next visit - Basic Metabolic Panel - NIFEdipine (ADALAT CC) 90 MG 24 hr tablet; Take 1 tablet (90 mg total) daily by mouth.  Dispense: 90 tablet; Refill: 1 - carvedilol (COREG) 12.5 MG tablet; Take 1 tablet (12.5 mg total) 2 (two) times daily with a meal by mouth.  Dispense: 60 tablet; Refill: 3 - lisinopril (PRINIVIL,ZESTRIL) 5 MG tablet; Take 1 tablet (5 mg total) daily by mouth.  Dispense: 30 tablet; Refill: 3  3. Pure hypercholesterolemia Controlled Low-cholesterol diet - atorvastatin (LIPITOR) 20 MG tablet; Take 1 tablet (20 mg total) daily by mouth.  Dispense: 30 tablet; Refill: 3  4. Insomnia due to psychological stress PHQ 9 = 18 but she denies depression or anxiety Will commence Remeron which is a mild  antidepressant and will help with insomnia, appetite - mirtazapine (REMERON) 15 MG tablet; Take 1 tablet (15 mg total) at bedtime by mouth.  Dispense: 30 tablet; Refill: 3  5. Bereavement States she does not need grief counseling at this time   Meds ordered this encounter  Medications  . NIFEdipine (ADALAT CC) 90 MG 24 hr tablet    Sig: Take 1 tablet (90 mg total) daily by mouth.    Dispense:  90 tablet    Refill:  1    Must have office visit for refills  . carvedilol (COREG) 12.5 MG tablet    Sig: Take 1 tablet (12.5 mg total) 2 (two) times daily with a meal by mouth.    Dispense:  60 tablet    Refill:  3  . atorvastatin (LIPITOR) 20 MG tablet    Sig: Take 1 tablet (20 mg total) daily by mouth.    Dispense:  30 tablet    Refill:  3    Script sent to [facility] early June  . lisinopril (PRINIVIL,ZESTRIL) 5 MG tablet    Sig: Take 1 tablet (5 mg total) daily by mouth.    Dispense:  30 tablet    Refill:  3  . mirtazapine (REMERON) 15 MG tablet    Sig: Take 1 tablet (15 mg total) at bedtime by mouth.    Dispense:  30 tablet    Refill:  3    Follow-up: Return in about 3 months (around 08/10/2017) for follow up on hypertension.   Arnoldo Morale MD

## 2017-05-11 ENCOUNTER — Telehealth: Payer: Self-pay

## 2017-05-11 LAB — BASIC METABOLIC PANEL
BUN / CREAT RATIO: 9 — AB (ref 12–28)
BUN: 8 mg/dL (ref 8–27)
CHLORIDE: 100 mmol/L (ref 96–106)
CO2: 25 mmol/L (ref 20–29)
Calcium: 11 mg/dL — ABNORMAL HIGH (ref 8.7–10.3)
Creatinine, Ser: 0.86 mg/dL (ref 0.57–1.00)
GFR calc Af Amer: 81 mL/min/{1.73_m2} (ref >59)
GFR calc non Af Amer: 70 mL/min/{1.73_m2} (ref >59)
GLUCOSE: 121 mg/dL — AB (ref 65–99)
POTASSIUM: 3.8 mmol/L (ref 3.5–5.2)
SODIUM: 142 mmol/L (ref 134–144)

## 2017-05-11 NOTE — Telephone Encounter (Signed)
Pt was called and a voicemail was left informing pt to return phone call for lab results.

## 2017-06-30 ENCOUNTER — Other Ambulatory Visit: Payer: Self-pay | Admitting: Family Medicine

## 2017-06-30 DIAGNOSIS — E7800 Pure hypercholesterolemia, unspecified: Secondary | ICD-10-CM

## 2017-07-04 ENCOUNTER — Other Ambulatory Visit: Payer: Self-pay | Admitting: Pharmacist

## 2017-07-04 DIAGNOSIS — E7800 Pure hypercholesterolemia, unspecified: Secondary | ICD-10-CM

## 2017-07-04 MED ORDER — ATORVASTATIN CALCIUM 20 MG PO TABS: 20.0000 mg | ORAL_TABLET | Freq: Every day | ORAL | 0 refills | Status: DC

## 2017-08-03 DIAGNOSIS — H2513 Age-related nuclear cataract, bilateral: Secondary | ICD-10-CM | POA: Diagnosis not present

## 2017-08-03 DIAGNOSIS — E119 Type 2 diabetes mellitus without complications: Secondary | ICD-10-CM | POA: Diagnosis not present

## 2017-08-03 DIAGNOSIS — H40013 Open angle with borderline findings, low risk, bilateral: Secondary | ICD-10-CM | POA: Diagnosis not present

## 2017-08-03 DIAGNOSIS — H04123 Dry eye syndrome of bilateral lacrimal glands: Secondary | ICD-10-CM | POA: Diagnosis not present

## 2017-08-10 ENCOUNTER — Ambulatory Visit: Payer: PPO | Admitting: Family Medicine

## 2017-08-14 DIAGNOSIS — Z961 Presence of intraocular lens: Secondary | ICD-10-CM | POA: Diagnosis not present

## 2017-08-17 ENCOUNTER — Ambulatory Visit: Payer: PPO | Attending: Family Medicine | Admitting: Family Medicine

## 2017-08-17 ENCOUNTER — Encounter: Payer: Self-pay | Admitting: Family Medicine

## 2017-08-17 VITALS — BP 138/60 | HR 60 | Temp 98.1°F | Ht 62.0 in | Wt 112.2 lb

## 2017-08-17 DIAGNOSIS — E1100 Type 2 diabetes mellitus with hyperosmolarity without nonketotic hyperglycemic-hyperosmolar coma (NKHHC): Secondary | ICD-10-CM | POA: Insufficient documentation

## 2017-08-17 DIAGNOSIS — Z79899 Other long term (current) drug therapy: Secondary | ICD-10-CM | POA: Diagnosis not present

## 2017-08-17 DIAGNOSIS — Z1159 Encounter for screening for other viral diseases: Secondary | ICD-10-CM | POA: Diagnosis not present

## 2017-08-17 DIAGNOSIS — E119 Type 2 diabetes mellitus without complications: Secondary | ICD-10-CM | POA: Diagnosis present

## 2017-08-17 DIAGNOSIS — Z7982 Long term (current) use of aspirin: Secondary | ICD-10-CM | POA: Diagnosis not present

## 2017-08-17 DIAGNOSIS — Z885 Allergy status to narcotic agent status: Secondary | ICD-10-CM | POA: Diagnosis not present

## 2017-08-17 DIAGNOSIS — G8929 Other chronic pain: Secondary | ICD-10-CM | POA: Insufficient documentation

## 2017-08-17 DIAGNOSIS — E7800 Pure hypercholesterolemia, unspecified: Secondary | ICD-10-CM

## 2017-08-17 DIAGNOSIS — M25512 Pain in left shoulder: Secondary | ICD-10-CM | POA: Insufficient documentation

## 2017-08-17 DIAGNOSIS — Z23 Encounter for immunization: Secondary | ICD-10-CM | POA: Insufficient documentation

## 2017-08-17 DIAGNOSIS — I10 Essential (primary) hypertension: Secondary | ICD-10-CM | POA: Insufficient documentation

## 2017-08-17 LAB — GLUCOSE, POCT (MANUAL RESULT ENTRY): POC Glucose: 141 mg/dl — AB (ref 70–99)

## 2017-08-17 MED ORDER — PNEUMOCOCCAL 13-VAL CONJ VACC IM SUSP: 0.5000 mL | Freq: Once | INTRAMUSCULAR | 0 refills | Status: AC

## 2017-08-17 MED ORDER — ATORVASTATIN CALCIUM 20 MG PO TABS: 20.0000 mg | ORAL_TABLET | Freq: Every day | ORAL | 1 refills | Status: DC

## 2017-08-17 MED ORDER — LISINOPRIL 5 MG PO TABS: 5.0000 mg | ORAL_TABLET | Freq: Every day | ORAL | 1 refills | Status: DC

## 2017-08-17 MED ORDER — NIFEDIPINE ER 90 MG PO TB24: 90.0000 mg | ORAL_TABLET | Freq: Every day | ORAL | 1 refills | Status: DC

## 2017-08-17 MED ORDER — CARVEDILOL 12.5 MG PO TABS: 12.5000 mg | ORAL_TABLET | Freq: Two times a day (BID) | ORAL | 1 refills | Status: DC

## 2017-08-17 MED FILL — PREVNAR 13 SYRINGE: 1 days supply | Qty: 1 | Fill #0

## 2017-08-17 NOTE — Patient Instructions (Signed)

## 2017-08-17 NOTE — Progress Notes (Signed)
Subjective:  Patient ID: Lisa Taylor, female    DOB: 15-Nov-1950  Age: 67 y.o. MRN: [id number]  CC: Diabetes   HPI Lisa Taylor is a 67 year old female with a history of type 2 diabetes mellitus (diet controlled with A1c of 6.4), hypertension, chronic left shoulder pain who presents today for follow-up visit.  She endorses compliance with her antihypertensive and denies adverse effects from the medication.  Also tolerates her statin with no complaints of myalgia. With regards to her diabetes mellitus she has been on dietary control and fasting blood sugars have been in the low 100s. She does have blurry vision and was recently seen by [facility] with no diabetic retinopathy identified however she will be prescribed reading glasses.  She denies neuropathy in extremities.  Left shoulder is doing well and she only takes tramadol intermittently especially when the weather is cold.    Past Medical History:  Diagnosis Date  . Hypertension     History reviewed. No pertinent surgical history.  Allergies  Allergen Reactions  . Codeine Other (See Comments)    Nausea/vomitting; some dizziness     Outpatient Medications Prior to Visit  Medication Sig Dispense Refill  . aspirin (EQ ASPIRIN LOW DOSE) 81 MG chewable tablet CHEW AND SWALLOW ONE TABLET BY MOUTH ONCE DAILY 30 tablet 3  . Calcium Carbonate-Vitamin D (CALCIUM 600+D) 600-400 MG-UNIT tablet Take 1 tablet by mouth daily.    . cyclobenzaprine (FLEXERIL) 10 MG tablet Take 1 tablet (10 mg total) by mouth 2 (two) times daily as needed for muscle spasms. 90 tablet 1  . glucose blood (ONE TOUCH ULTRA TEST) test strip Use as instructed daily before breakfast 100 each 3  . hydrocortisone 2.5 % cream Apply topically 2 (two) times daily. Apply to legs 2 times a day. Dispense 30 grams 30 g 3  . mirtazapine (REMERON) 15 MG tablet Take 1 tablet (15 mg total) at bedtime by mouth. 30 tablet 3  . traMADol (ULTRAM) 50 MG tablet  Take 1 tablet (50 mg total) by mouth every 12 (twelve) hours as needed. 50 tablet 1  . atorvastatin (LIPITOR) 20 MG tablet Take 1 tablet (20 mg total) by mouth daily. 90 tablet 0  . carvedilol (COREG) 12.5 MG tablet Take 1 tablet (12.5 mg total) 2 (two) times daily with a meal by mouth. 60 tablet 3  . lisinopril (PRINIVIL,ZESTRIL) 5 MG tablet Take 1 tablet (5 mg total) daily by mouth. 30 tablet 3  . NIFEdipine (ADALAT CC) 90 MG 24 hr tablet Take 1 tablet (90 mg total) daily by mouth. 90 tablet 1  . cetaphil (CETAPHIL) lotion Apply 1 application topically as needed for dry skin. (Patient not taking: Reported on 10/26/2016) 473 mL 0   No facility-administered medications prior to visit.     ROS Review of Systems  Constitutional: Negative for activity change, appetite change and fatigue.  HENT: Negative for congestion, sinus pressure and sore throat.   Eyes: Negative for visual disturbance.  Respiratory: Negative for cough, chest tightness, shortness of breath and wheezing.   Cardiovascular: Negative for chest pain and palpitations.  Gastrointestinal: Negative for abdominal distention, abdominal pain and constipation.  Endocrine: Negative for polydipsia.  Genitourinary: Negative for dysuria and frequency.  Musculoskeletal: Negative for arthralgias and back pain.  Skin: Negative for rash.  Neurological: Negative for tremors, light-headedness and numbness.  Hematological: Does not bruise/bleed easily.  Psychiatric/Behavioral: Negative for agitation and behavioral problems.    Objective:  BP 138/60   Pulse 60   Temp 98.1 F (36.7 C) (Oral)   Ht _0  (1.575 m)   Wt 112 lb 3.2 oz (50.9 kg)   SpO2 99%   BMI 20.52 kg/m   BP/Weight 08/17/2017 05/10/2017 5/46/5681  Systolic BP 275 170 017  Diastolic BP 60 73 66  Wt. (Lbs) 112.2 115.8 122.6  BMI 20.52 21.18 22.42    Physical Exam  Constitutional: She is oriented to person, place, and time. She appears well-developed and well-nourished.   Cardiovascular: Normal rate, normal heart sounds and intact distal pulses.  No murmur heard. Pulmonary/Chest: Effort normal and breath sounds normal. She has no wheezes. She has no rales. She exhibits no tenderness.  Abdominal: Soft. Bowel sounds are normal. She exhibits no distension and no mass. There is no tenderness.  Musculoskeletal: Normal range of motion.  Neurological: She is alert and oriented to person, place, and time.  Skin: Skin is warm and dry.  Psychiatric: She has a normal mood and affect.      CMP Latest Ref Rng & Units 05/10/2017 08/01/2016 02/03/2015  Glucose 65 - 99 mg/dL 121(H) 130(H) 107(H)  BUN 8 - 27 mg/dL _1 Creatinine 0.57 - 1.00 mg/dL 0.86 0.93 0.87  Sodium 134 - 144 mmol/L 142 139 139  Potassium 3.5 - 5.2 mmol/L 3.8 4.2 4.4  Chloride 96 - 106 mmol/L 100 103 99  CO2 20 - 29 mmol/L _2 Calcium 8.7 - 10.3 mg/dL 11.0(H) 11.0(H) 10.5(H)  Total Protein 6.1 - 8.1 g/dL - 8.1 -  Total Bilirubin 0.2 - 1.2 mg/dL - 0.5 -  Alkaline Phos 33 - 130 U/L - 82 -  AST 10 - 35 U/L - 17 -  ALT 6 - 29 U/L - 8 -    Lipid Panel     Component Value Date/Time   CHOL 141 08/01/2016 1047   TRIG 45 08/01/2016 1047   HDL 65 08/01/2016 1047   CHOLHDL 2.2 08/01/2016 1047   VLDL 9 08/01/2016 1047   LDLCALC 67 08/01/2016 1047    Lab Results  Component Value Date   HGBA1C 6.4 05/10/2017    Assessment & Plan:   1. Type 2 diabetes mellitus with hyperosmolarity without coma, without long-term current use of insulin (HCC) Diet controlled with A1c of 6.4 Counseled on Diabetic diet, my plate method, 494 minutes of moderate intensity exercise/week Keep blood sugar logs with fasting goals of 80-120 mg/dl, random of less than 180 and in the event of sugars less than 60 mg/dl or greater than 400 mg/dl please notify the clinic ASAP. It is recommended that you undergo annual eye exams and annual foot exams. Pneumonia vaccine is recommended. Up to date on eye exam Referred  to podiatry - POCT glucose (manual entry) - Hemoglobin A1c; Future - CMP14+EGFR; Future - Microalbumin/Creatinine Ratio, Urine; Future  2. Essential hypertension Controlled Counseled on blood pressure goal of less than 130/80, low-sodium, DASH diet, medication compliance, 150 minutes of moderate intensity exercise per week. Discussed medication compliance, adverse effects. - NIFEdipine (ADALAT CC) 90 MG 24 hr tablet; Take 1 tablet (90 mg total) by mouth daily.  Dispense: 90 tablet; Refill: 1 - lisinopril (PRINIVIL,ZESTRIL) 5 MG tablet; Take 1 tablet (5 mg total) by mouth daily.  Dispense: 90 tablet; Refill: 1 - carvedilol (COREG) 12.5 MG tablet; Take 1 tablet (12.5 mg total) by mouth 2 (two) times daily with a meal.  Dispense: 180 tablet; Refill: 1  3. Pure  hypercholesterolemia Controlled Low-cholesterol diet - Lipid panel; Future - atorvastatin (LIPITOR) 20 MG tablet; Take 1 tablet (20 mg total) by mouth daily.  Dispense: 90 tablet; Refill: 1  4. Need for hepatitis C screening test - Hepatitis c antibody (reflex); Future  5. Need for vaccination against Streptococcus pneumoniae Prevnar 13 will be administered by the pharmacist   Meds ordered this encounter  Medications  . NIFEdipine (ADALAT CC) 90 MG 24 hr tablet    Sig: Take 1 tablet (90 mg total) by mouth daily.    Dispense:  90 tablet    Refill:  1  . lisinopril (PRINIVIL,ZESTRIL) 5 MG tablet    Sig: Take 1 tablet (5 mg total) by mouth daily.    Dispense:  90 tablet    Refill:  1  . carvedilol (COREG) 12.5 MG tablet    Sig: Take 1 tablet (12.5 mg total) by mouth 2 (two) times daily with a meal.    Dispense:  180 tablet    Refill:  1  . atorvastatin (LIPITOR) 20 MG tablet    Sig: Take 1 tablet (20 mg total) by mouth daily.    Dispense:  90 tablet    Refill:  1    Follow-up: Return in about 3 months (around 11/14/2017) for follow up on diabetes and hypertension.   Charlott Rakes MD

## 2017-08-20 ENCOUNTER — Ambulatory Visit: Payer: PPO | Attending: Family Medicine

## 2017-08-20 DIAGNOSIS — Z1159 Encounter for screening for other viral diseases: Secondary | ICD-10-CM | POA: Diagnosis not present

## 2017-08-20 DIAGNOSIS — E1100 Type 2 diabetes mellitus with hyperosmolarity without nonketotic hyperglycemic-hyperosmolar coma (NKHHC): Secondary | ICD-10-CM | POA: Insufficient documentation

## 2017-08-20 DIAGNOSIS — E7800 Pure hypercholesterolemia, unspecified: Secondary | ICD-10-CM

## 2017-08-20 NOTE — Progress Notes (Signed)
Patient here for lab visit only 

## 2017-08-21 LAB — HCV COMMENT:

## 2017-08-21 LAB — CMP14+EGFR
A/G RATIO: 1.8 (ref 1.2–2.2)
ALK PHOS: 89 IU/L (ref 39–117)
ALT: 14 IU/L (ref 0–32)
AST: 16 IU/L (ref 0–40)
Albumin: 5.1 g/dL — ABNORMAL HIGH (ref 3.6–4.8)
BILIRUBIN TOTAL: 0.4 mg/dL (ref 0.0–1.2)
BUN/Creatinine Ratio: 9 — ABNORMAL LOW (ref 12–28)
BUN: 7 mg/dL — ABNORMAL LOW (ref 8–27)
CHLORIDE: 102 mmol/L (ref 96–106)
CO2: 24 mmol/L (ref 20–29)
Calcium: 10.9 mg/dL — ABNORMAL HIGH (ref 8.7–10.3)
Creatinine, Ser: 0.79 mg/dL (ref 0.57–1.00)
GFR calc Af Amer: 90 mL/min/{1.73_m2} (ref >59)
GFR calc non Af Amer: 78 mL/min/{1.73_m2} (ref >59)
Globulin, Total: 2.9 g/dL (ref 1.5–4.5)
Glucose: 135 mg/dL — ABNORMAL HIGH (ref 65–99)
POTASSIUM: 4.3 mmol/L (ref 3.5–5.2)
SODIUM: 143 mmol/L (ref 134–144)
Total Protein: 8 g/dL (ref 6.0–8.5)

## 2017-08-21 LAB — MICROALBUMIN / CREATININE URINE RATIO
CREATININE, UR: 30.6 mg/dL
Microalb/Creat Ratio: 309.8 mg/g creat — ABNORMAL HIGH (ref 0.0–30.0)
Microalbumin, Urine: 94.8 ug/mL

## 2017-08-21 LAB — HEPATITIS C ANTIBODY (REFLEX): HCV Ab: <0.1 s/co ratio (ref 0.0–0.9)

## 2017-08-21 LAB — LIPID PANEL
CHOL/HDL RATIO: 2.3 ratio (ref 0.0–4.4)
Cholesterol, Total: 163 mg/dL (ref 100–199)
HDL: 70 mg/dL (ref >39)
LDL Calculated: 84 mg/dL (ref 0–99)
TRIGLYCERIDES: 45 mg/dL (ref 0–149)
VLDL CHOLESTEROL CAL: 9 mg/dL (ref 5–40)

## 2017-08-21 LAB — HEMOGLOBIN A1C
ESTIMATED AVERAGE GLUCOSE: 134 mg/dL
Hgb A1c MFr Bld: 6.3 % — ABNORMAL HIGH (ref 4.8–5.6)

## 2017-08-27 ENCOUNTER — Telehealth: Payer: Self-pay

## 2017-08-27 NOTE — Telephone Encounter (Signed)
Patient was called and informed of lab results. 

## 2017-09-19 ENCOUNTER — Encounter: Payer: Self-pay | Admitting: Podiatry

## 2017-09-19 ENCOUNTER — Ambulatory Visit (INDEPENDENT_AMBULATORY_CARE_PROVIDER_SITE_OTHER): Payer: PPO | Admitting: Podiatry

## 2017-09-19 DIAGNOSIS — L608 Other nail disorders: Secondary | ICD-10-CM

## 2017-09-19 DIAGNOSIS — M79674 Pain in right toe(s): Secondary | ICD-10-CM

## 2017-09-19 DIAGNOSIS — M79675 Pain in left toe(s): Secondary | ICD-10-CM | POA: Diagnosis not present

## 2017-09-19 DIAGNOSIS — B351 Tinea unguium: Secondary | ICD-10-CM

## 2017-09-19 NOTE — Progress Notes (Signed)
Complaint:  Visit Type: Patient returns to my office for  preventative foot care services. Complaint: Patient states" my nails have grown long and thick and become painful to walk and wear shoes" Patient has been diagnosed with DM with no foot complications. The patient presents for preventative foot care services. No changes to ROS  Podiatric Exam: Vascular: dorsalis pedis and posterior tibial pulses are palpable bilateral. Capillary return is immediate. Temperature gradient is WNL. Skin turgor WNL  Sensorium: Normal Semmes Weinstein monofilament test. Normal tactile sensation bilaterally. Nail Exam: Pt has thick disfigured discolored nails with subungual debris noted bilateral entire nail hallux through fifth toenails.  Pincer nails 1,2,3  B/L.  No infection noted. Ulcer Exam: There is no evidence of ulcer or pre-ulcerative changes or infection. Orthopedic Exam: Muscle tone and strength are WNL. No limitations in general ROM. No crepitus or effusions noted. Foot type and digits show no abnormalities. Bony prominences are unremarkable. Skin: No Porokeratosis. No infection or ulcers  Diagnosis:  Onychomycosis, , Pain in right toe, pain in left toes.  Pincer toenails  B/L.  Treatment & Plan Procedures and Treatment: Consent by patient was obtained for treatment procedures.   Debridement of mycotic and hypertrophic toenails, 1 through 5 bilateral and clearing of subungual debris. No ulceration, no infection noted.  Return Visit-Office Procedure: Patient instructed to return to the office for a follow up visit 3 months for continued evaluation and treatment.    Gardiner Barefoot DPM

## 2017-10-22 ENCOUNTER — Other Ambulatory Visit: Payer: Self-pay | Admitting: Family Medicine

## 2017-11-16 ENCOUNTER — Encounter: Payer: Self-pay | Admitting: Family Medicine

## 2017-11-16 ENCOUNTER — Ambulatory Visit: Payer: PPO | Attending: Family Medicine | Admitting: Family Medicine

## 2017-11-16 VITALS — BP 143/64 | HR 61 | Temp 98.1°F | Ht 62.0 in | Wt 112.4 lb

## 2017-11-16 DIAGNOSIS — G8929 Other chronic pain: Secondary | ICD-10-CM | POA: Diagnosis not present

## 2017-11-16 DIAGNOSIS — E1165 Type 2 diabetes mellitus with hyperglycemia: Secondary | ICD-10-CM | POA: Diagnosis not present

## 2017-11-16 DIAGNOSIS — Z7982 Long term (current) use of aspirin: Secondary | ICD-10-CM | POA: Diagnosis not present

## 2017-11-16 DIAGNOSIS — I10 Essential (primary) hypertension: Secondary | ICD-10-CM | POA: Diagnosis not present

## 2017-11-16 DIAGNOSIS — Z79899 Other long term (current) drug therapy: Secondary | ICD-10-CM | POA: Insufficient documentation

## 2017-11-16 DIAGNOSIS — Z885 Allergy status to narcotic agent status: Secondary | ICD-10-CM | POA: Insufficient documentation

## 2017-11-16 DIAGNOSIS — E7800 Pure hypercholesterolemia, unspecified: Secondary | ICD-10-CM | POA: Diagnosis not present

## 2017-11-16 DIAGNOSIS — M25512 Pain in left shoulder: Secondary | ICD-10-CM | POA: Insufficient documentation

## 2017-11-16 LAB — GLUCOSE, POCT (MANUAL RESULT ENTRY): POC GLUCOSE: 145 mg/dL — AB (ref 70–99)

## 2017-11-16 LAB — POCT GLYCOSYLATED HEMOGLOBIN (HGB A1C): Hemoglobin A1C: 6.2

## 2017-11-16 MED ORDER — PREDNISONE 20 MG PO TABS: 20.0000 mg | ORAL_TABLET | Freq: Two times a day (BID) | ORAL | 0 refills | Status: DC

## 2017-11-16 NOTE — Progress Notes (Signed)
Subjective:  Patient ID: Lisa Taylor, female    DOB: 04-03-1951  Age: 67 y.o. MRN: [id number]  CC: Diabetes   HPI Lisa Taylor is a 67 year old female with a history of type 2 diabetes mellitus (diet controlled with A1c of 6.2), hypertension, chronic left shoulder pain who presents today for follow-up visit.  Her diabetes is diet controlled and she denies hypoglycemia or numbness in extremities.  She is up-to-date on an annual eye exam and was seen by podiatry on 09/19/2017 and had her toenails clipped. Tolerating her antihypertensive and statin with no complaints of adverse effects. She continues to have chronic left shoulder pain from a previous history of rotator cuff tendinitis with restricted range of motion and uses tramadol as needed when symptoms are severe. She has no acute concerns today.  Past Medical History:  Diagnosis Date  . Hypertension     History reviewed. No pertinent surgical history.  Allergies  Allergen Reactions  . Codeine Other (See Comments)    Nausea/vomitting; some dizziness     Outpatient Medications Prior to Visit  Medication Sig Dispense Refill  . aspirin (EQ ASPIRIN LOW DOSE) 81 MG chewable tablet CHEW AND SWALLOW ONE TABLET BY MOUTH ONCE DAILY 30 tablet 3  . atorvastatin (LIPITOR) 20 MG tablet Take 1 tablet (20 mg total) by mouth daily. 90 tablet 1  . Calcium Carbonate-Vitamin D (CALCIUM 600+D) 600-400 MG-UNIT tablet Take 1 tablet by mouth daily.    . carvedilol (COREG) 12.5 MG tablet Take 1 tablet (12.5 mg total) by mouth 2 (two) times daily with a meal. 180 tablet 1  . cyclobenzaprine (FLEXERIL) 10 MG tablet Take 1 tablet (10 mg total) by mouth 2 (two) times daily as needed for muscle spasms. 90 tablet 1  . glucose blood (ONE TOUCH ULTRA TEST) test strip USE AS DIRECTED ONCE DAILY BEFORE BREAKFAST 100 each 3  . hydrocortisone 2.5 % cream Apply topically 2 (two) times daily. Apply to legs 2 times a day. Dispense 30 grams 30 g 3  .  lisinopril (PRINIVIL,ZESTRIL) 5 MG tablet Take 1 tablet (5 mg total) by mouth daily. 90 tablet 1  . mirtazapine (REMERON) 15 MG tablet Take 1 tablet (15 mg total) at bedtime by mouth. 30 tablet 3  . NIFEdipine (ADALAT CC) 90 MG 24 hr tablet Take 1 tablet (90 mg total) by mouth daily. 90 tablet 1  . traMADol (ULTRAM) 50 MG tablet Take 1 tablet (50 mg total) by mouth every 12 (twelve) hours as needed. 50 tablet 1  . cetaphil (CETAPHIL) lotion Apply 1 application topically as needed for dry skin. (Patient not taking: Reported on 10/26/2016) 473 mL 0   No facility-administered medications prior to visit.     ROS Review of Systems  Constitutional: Negative for activity change, appetite change and fatigue.  HENT: Negative for congestion, sinus pressure and sore throat.   Eyes: Negative for visual disturbance.  Respiratory: Negative for cough, chest tightness, shortness of breath and wheezing.   Cardiovascular: Negative for chest pain and palpitations.  Gastrointestinal: Negative for abdominal distention, abdominal pain and constipation.  Endocrine: Negative for polydipsia.  Genitourinary: Negative for dysuria and frequency.  Musculoskeletal: Negative for arthralgias and back pain.       See hpi  Skin: Negative for rash.  Neurological: Negative for tremors, light-headedness and numbness.  Hematological: Does not bruise/bleed easily.  Psychiatric/Behavioral: Negative for agitation and behavioral problems.    Objective:  BP (!) 143/64  Pulse 61   Temp 98.1 F (36.7 C) (Oral)   Ht 5\' 2"  (1.575 m)   Wt 112 lb 6.4 oz (51 kg)   SpO2 98%   BMI 20.56 kg/m   BP/Weight 11/16/2017 08/17/2017 03/07/7095  Systolic BP 283 662 947  Diastolic BP 64 60 73  Wt. (Lbs) 112.4 112.2 115.8  BMI 20.56 20.52 21.18      Physical Exam  Constitutional: She is oriented to person, place, and time. She appears well-developed and well-nourished.  Cardiovascular: Normal rate, normal heart sounds and intact  distal pulses.  No murmur heard. Pulmonary/Chest: Effort normal and breath sounds normal. She has no wheezes. She has no rales. She exhibits no tenderness.  Abdominal: Soft. Bowel sounds are normal. She exhibits no distension and no mass. There is no tenderness.  Musculoskeletal:  Left shoulder normal appearance Active forward elevation limited to 100 degrees with associated tenderness on range of motion Right shoulder is normal  Neurological: She is alert and oriented to person, place, and time.  Skin: Skin is warm and dry.  Psychiatric: She has a normal mood and affect.     CMP Latest Ref Rng & Units 08/20/2017 05/10/2017 08/01/2016  Glucose 65 - 99 mg/dL 135(H) 121(H) 130(H)  BUN 8 - 27 mg/dL 7(L) 8 10  Creatinine 0.57 - 1.00 mg/dL 0.79 0.86 0.93  Sodium 134 - 144 mmol/L 143 142 139  Potassium 3.5 - 5.2 mmol/L 4.3 3.8 4.2  Chloride 96 - 106 mmol/L 102 100 103  CO2 20 - 29 mmol/L 24 25 25   Calcium 8.7 - 10.3 mg/dL 10.9(H) 11.0(H) 11.0(H)  Total Protein 6.0 - 8.5 g/dL 8.0 - 8.1  Total Bilirubin 0.0 - 1.2 mg/dL 0.4 - 0.5  Alkaline Phos 39 - 117 IU/L 89 - 82  AST 0 - 40 IU/L 16 - 17  ALT 0 - 32 IU/L 14 - 8    Lipid Panel     Component Value Date/Time   CHOL 163 08/20/2017 0906   TRIG 45 08/20/2017 0906   HDL 70 08/20/2017 0906   CHOLHDL 2.3 08/20/2017 0906   CHOLHDL 2.2 08/01/2016 1047   VLDL 9 08/01/2016 1047   LDLCALC 84 08/20/2017 0906     Lab Results  Component Value Date   HGBA1C 6.2 11/16/2017    Assessment & Plan:   1. Type 2 diabetes mellitus with hyperglycemia, without long-term current use of insulin (HCC) Diet controlled with A1c of 6.2 Continue diabetic diet, lifestyle modifications Up-to-date on podiatry exam and eye exam - POCT glucose (manual entry) - POCT glycosylated hemoglobin (Hb A1C)  2. Essential hypertension Slightly elevated No regimen change today Low sodium, DASH diet  3. Pain in joint of left shoulder Chronic pain; previous history  of rotator cuff surgery Takes tramadol as needed Will probably benefit from cortisone injections - predniSONE (DELTASONE) 20 MG tablet; Take 1 tablet (20 mg total) by mouth 2 (two) times daily with a meal.  Dispense: 10 tablet; Refill: 0 - AMB referral to orthopedics - DG Shoulder Left; Future  4. Pure hypercholesterolemia Controlled Continue statin   Meds ordered this encounter  Medications  . predniSONE (DELTASONE) 20 MG tablet    Sig: Take 1 tablet (20 mg total) by mouth 2 (two) times daily with a meal.    Dispense:  10 tablet    Refill:  0    Follow-up: Return in about 3 months (around 02/16/2018) for follow up of chronic medical conditions.   Charlott Rakes MD

## 2017-11-16 NOTE — Patient Instructions (Signed)
Shoulder Pain Many things can cause shoulder pain, including:  An injury to the area.  Overuse of the shoulder.  Arthritis.  The source of the pain can be:  Inflammation.  An injury to the shoulder joint.  An injury to a tendon, ligament, or bone.  Follow these instructions at home: Take these actions to help with your pain:  Squeeze a soft ball or a foam pad as much as possible. This helps to keep the shoulder from swelling. It also helps to strengthen the arm.  Take over-the-counter and prescription medicines only as told by your health care provider.  If directed, apply ice to the area: ? Put ice in a plastic bag. ? Place a towel between your skin and the bag. ? Leave the ice on for 20 minutes, 2-3 times per day. Stop applying ice if it does not help with the pain.  If you were given a shoulder sling or immobilizer: ? Wear it as told. ? Remove it to shower or bathe. ? Move your arm as little as possible, but keep your hand moving to prevent swelling.  Contact a health care provider if:  Your pain gets worse.  Your pain is not relieved with medicines.  New pain develops in your arm, hand, or fingers. Get help right away if:  Your arm, hand, or fingers: ? Tingle. ? Become numb. ? Become swollen. ? Become painful. ? Turn white or blue. This information is not intended to replace advice given to you by your health care provider. Make sure you discuss any questions you have with your health care provider. Document Released: 03/29/2005 Document Revised: 02/13/2016 Document Reviewed: 10/12/2014 Elsevier Interactive Patient Education  [employer].

## 2017-11-24 ENCOUNTER — Ambulatory Visit (HOSPITAL_COMMUNITY)
Admission: RE | Admit: 2017-11-24 | Discharge: 2017-11-24 | Disposition: A | Discharge status: Home / Self Care | Payer: PPO | Source: Ambulatory Visit | Attending: Family Medicine | Admitting: Family Medicine

## 2017-11-24 DIAGNOSIS — M24812 Other specific joint derangements of left shoulder, not elsewhere classified: Secondary | ICD-10-CM | POA: Insufficient documentation

## 2017-11-24 DIAGNOSIS — M25512 Pain in left shoulder: Secondary | ICD-10-CM

## 2017-11-28 ENCOUNTER — Encounter (INDEPENDENT_AMBULATORY_CARE_PROVIDER_SITE_OTHER): Payer: Self-pay | Admitting: Orthopaedic Surgery

## 2017-11-28 ENCOUNTER — Ambulatory Visit (INDEPENDENT_AMBULATORY_CARE_PROVIDER_SITE_OTHER): Payer: PPO | Admitting: Orthopaedic Surgery

## 2017-11-28 DIAGNOSIS — M19012 Primary osteoarthritis, left shoulder: Secondary | ICD-10-CM

## 2017-11-28 NOTE — Progress Notes (Signed)
Office Visit Note   Patient: Lisa Taylor           Date of Birth: 1951-05-11           MRN: [id number] Visit Date: 11/28/2017              Requested by: Charlott Rakes, MD [place], [place] [id number] PCP: Charlott Rakes, MD   Assessment & Plan: Visit Diagnoses:  1. Primary osteoarthritis, left shoulder     Plan: Impression is end-stage left shoulder arthritis.  We discussed the whole spectrum of treatment options including conservative versus shoulder replacement.  At this point patient is not interested in any of those.  She wishes to continue to live with this.  Questions encouraged and answered.  Follow-up as needed.  Follow-Up Instructions: Return if symptoms worsen or fail to improve.   Orders:  No orders of the defined types were placed in this encounter.  No orders of the defined types were placed in this encounter.     Procedures: No procedures performed   Clinical Data: No additional findings.   Subjective: Chief Complaint  Patient presents with  . Left Shoulder - Pain    Lisa Taylor is a 67 year old female comes in with left shoulder pain and popping.  She has had previous surgery done in 1993.  She also states that she had a history of shoulder dislocations.  She is diabetic but not insulin.   Review of Systems  Constitutional: Negative.   HENT: Negative.   Eyes: Negative.   Respiratory: Negative.   Cardiovascular: Negative.   Endocrine: Negative.   Musculoskeletal: Negative.   Neurological: Negative.   Hematological: Negative.   Psychiatric/Behavioral: Negative.   All other systems reviewed and are negative.    Objective: Vital Signs: There were no vitals taken for this visit.  Physical Exam  Constitutional: She is oriented to person, place, and time. She appears well-developed and well-nourished.  HENT:  Head: Normocephalic and atraumatic.  Eyes: EOM are normal.  Neck: Neck supple.  Pulmonary/Chest: Effort  normal.  Abdominal: Soft.  Neurological: She is alert and oriented to person, place, and time.  Skin: Skin is warm. Capillary refill takes less than 2 seconds.  Psychiatric: She has a normal mood and affect. Her behavior is normal. Judgment and thought content normal.  Nursing note and vitals reviewed.   Ortho Exam Left shoulder exam shows catching and limited range of motion in all planes.  Rotator cuff is grossly intact. Specialty Comments:  No specialty comments available.  Imaging: No results found.   PMFS History: Patient Active Problem List   Diagnosis Date Noted  . 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 Type 2 diabetes mellitus ([place]) 11/10/2015  . Neuropathy 08/06/2015  . Hypertension 04/14/2015  . Hyperlipidemia 04/14/2015  . Neck pain 04/14/2015  . Pain in joint, shoulder region 03/12/2015  . Elevated plasma metanephrines 02/10/2015  . Healthcare maintenance 06/08/2011  . Hypercalcemia 09/06/2010  . RENAL FAILURE, ACUTE 06/23/2010  . HEMATURIA UNSPECIFIED 04/13/2010  . CARDIAC MURMUR 03/08/2010  . TOBACCO USER 03/20/2009  . 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 Essential hypertension 08/30/2006  . GASTROESOPHAGEAL REFLUX, NO ESOPHAGITIS 08/30/2006  . OSTEOARTHRITIS, MULTI SITES 08/30/2006   Past Medical History:  Diagnosis Date  . Hypertension     History reviewed. No pertinent family history.  History reviewed. No pertinent surgical history. Social History   Occupational History  . Not on file  Tobacco Use  . Smoking  status: Current Every Day Smoker    Packs/day: 0.50    Years: 15.00    Pack years: 7.50    Types: Cigarettes  . Smokeless tobacco: Former Systems developer  . Tobacco comment: 10 cigs daily  Substance and Sexual Activity  . Alcohol use: Yes    Alcohol/week: 0.6 - 1.2 oz    Types: 1 - 2 Shots of liquor per week    Comment: last time 2017  . Drug use: Yes    Frequency: 1.0 times per week    Types: Marijuana    Comment: this month  .  Sexual activity: Yes

## 2017-11-29 ENCOUNTER — Telehealth: Payer: Self-pay

## 2017-11-29 NOTE — Telephone Encounter (Signed)
Patient was called and informed of x-ray results.

## 2017-12-19 ENCOUNTER — Ambulatory Visit: Payer: PPO | Admitting: Podiatry

## 2018-01-08 ENCOUNTER — Encounter: Payer: Self-pay | Admitting: Podiatry

## 2018-01-08 ENCOUNTER — Ambulatory Visit: Payer: PPO | Admitting: Podiatry

## 2018-01-08 DIAGNOSIS — B351 Tinea unguium: Secondary | ICD-10-CM

## 2018-01-08 DIAGNOSIS — M79674 Pain in right toe(s): Secondary | ICD-10-CM | POA: Diagnosis not present

## 2018-01-08 DIAGNOSIS — L608 Other nail disorders: Secondary | ICD-10-CM

## 2018-01-08 DIAGNOSIS — M79675 Pain in left toe(s): Secondary | ICD-10-CM

## 2018-01-08 NOTE — Progress Notes (Signed)
Complaint:  Visit Type: Patient returns to my office for  preventative foot care services. Complaint: Patient states" my nails have grown long and thick and become painful to walk and wear shoes" Patient has been diagnosed with DM with no foot complications. The patient presents for preventative foot care services. No changes to ROS  Podiatric Exam: Vascular: dorsalis pedis and posterior tibial pulses are palpable bilateral. Capillary return is immediate. Temperature gradient is WNL. Skin turgor WNL  Sensorium: Normal Semmes Weinstein monofilament test. Normal tactile sensation bilaterally. Nail Exam: Pt has thick disfigured discolored nails with subungual debris noted bilateral entire nail hallux through fifth toenails.  Pincer nails 1,2,3  B/L.  No infection noted. Ulcer Exam: There is no evidence of ulcer or pre-ulcerative changes or infection. Orthopedic Exam: Muscle tone and strength are WNL. No limitations in general ROM. No crepitus or effusions noted. Foot type and digits show no abnormalities. Bony prominences are unremarkable. Skin: No Porokeratosis. No infection or ulcers  Diagnosis:  Onychomycosis, , Pain in right toe, pain in left toes.  Pincer toenails  B/L.  Treatment & Plan Procedures and Treatment: Consent by patient was obtained for treatment procedures.   Debridement of mycotic and hypertrophic toenails, 1 through 5 bilateral and clearing of subungual debris. No ulceration, no infection noted.  Return Visit-Office Procedure: Patient instructed to return to the office for a follow up visit 3 months for continued evaluation and treatment.    Gardiner Barefoot DPM

## 2018-01-31 ENCOUNTER — Encounter: Payer: Self-pay | Admitting: Family Medicine

## 2018-01-31 DIAGNOSIS — H04123 Dry eye syndrome of bilateral lacrimal glands: Secondary | ICD-10-CM | POA: Diagnosis not present

## 2018-01-31 DIAGNOSIS — E119 Type 2 diabetes mellitus without complications: Secondary | ICD-10-CM | POA: Diagnosis not present

## 2018-01-31 DIAGNOSIS — H2513 Age-related nuclear cataract, bilateral: Secondary | ICD-10-CM | POA: Diagnosis not present

## 2018-01-31 DIAGNOSIS — H40013 Open angle with borderline findings, low risk, bilateral: Secondary | ICD-10-CM | POA: Diagnosis not present

## 2018-01-31 LAB — HM DIABETES EYE EXAM

## 2018-02-14 ENCOUNTER — Encounter: Payer: Self-pay | Admitting: Family Medicine

## 2018-02-14 ENCOUNTER — Ambulatory Visit: Payer: PPO | Attending: Family Medicine | Admitting: Family Medicine

## 2018-02-14 VITALS — BP 120/52 | HR 59 | Temp 97.9°F | Ht 62.0 in | Wt 109.4 lb

## 2018-02-14 DIAGNOSIS — G4700 Insomnia, unspecified: Secondary | ICD-10-CM | POA: Insufficient documentation

## 2018-02-14 DIAGNOSIS — G8929 Other chronic pain: Secondary | ICD-10-CM | POA: Insufficient documentation

## 2018-02-14 DIAGNOSIS — Z7952 Long term (current) use of systemic steroids: Secondary | ICD-10-CM | POA: Insufficient documentation

## 2018-02-14 DIAGNOSIS — E7800 Pure hypercholesterolemia, unspecified: Secondary | ICD-10-CM | POA: Diagnosis not present

## 2018-02-14 DIAGNOSIS — I10 Essential (primary) hypertension: Secondary | ICD-10-CM | POA: Diagnosis not present

## 2018-02-14 DIAGNOSIS — Z79899 Other long term (current) drug therapy: Secondary | ICD-10-CM | POA: Insufficient documentation

## 2018-02-14 DIAGNOSIS — Z7982 Long term (current) use of aspirin: Secondary | ICD-10-CM | POA: Diagnosis not present

## 2018-02-14 DIAGNOSIS — M25512 Pain in left shoulder: Secondary | ICD-10-CM | POA: Diagnosis not present

## 2018-02-14 DIAGNOSIS — Z885 Allergy status to narcotic agent status: Secondary | ICD-10-CM | POA: Insufficient documentation

## 2018-02-14 DIAGNOSIS — E1100 Type 2 diabetes mellitus with hyperosmolarity without nonketotic hyperglycemic-hyperosmolar coma (NKHHC): Secondary | ICD-10-CM | POA: Diagnosis not present

## 2018-02-14 DIAGNOSIS — E119 Type 2 diabetes mellitus without complications: Secondary | ICD-10-CM | POA: Diagnosis present

## 2018-02-14 DIAGNOSIS — F5102 Adjustment insomnia: Secondary | ICD-10-CM | POA: Diagnosis not present

## 2018-02-14 LAB — POCT GLYCOSYLATED HEMOGLOBIN (HGB A1C): HBA1C, POC (CONTROLLED DIABETIC RANGE): 6.1 % (ref 0.0–7.0)

## 2018-02-14 LAB — GLUCOSE, POCT (MANUAL RESULT ENTRY): POC Glucose: 145 mg/dl — AB (ref 70–99)

## 2018-02-14 MED ORDER — NIFEDIPINE ER 90 MG PO TB24: 90.0000 mg | ORAL_TABLET | Freq: Every day | ORAL | 1 refills | Status: DC

## 2018-02-14 MED ORDER — MIRTAZAPINE 15 MG PO TABS: 15.0000 mg | ORAL_TABLET | Freq: Every day | ORAL | 3 refills | Status: DC

## 2018-02-14 MED ORDER — CARVEDILOL 12.5 MG PO TABS: 12.5000 mg | ORAL_TABLET | Freq: Two times a day (BID) | ORAL | 1 refills | Status: DC

## 2018-02-14 MED ORDER — GLUCOSE BLOOD VI STRP: ORAL_STRIP | 3 refills | Status: DC

## 2018-02-14 MED ORDER — LISINOPRIL 5 MG PO TABS: 5.0000 mg | ORAL_TABLET | Freq: Every day | ORAL | 1 refills | Status: DC

## 2018-02-14 MED ORDER — ATORVASTATIN CALCIUM 20 MG PO TABS: 20.0000 mg | ORAL_TABLET | Freq: Every day | ORAL | 1 refills | Status: DC

## 2018-02-14 NOTE — Progress Notes (Signed)
Subjective:  Patient ID: Lisa Taylor, female    DOB: [birth year]  Age: 67 y.o. MRN: [id number]  CC: Diabetes   HPI Lisa Taylor is a 67 year old female with a history of type 2 diabetes mellitus (diet controlled with A1c of 6.1), hypertension, chronic left shoulder pain who presents today for a follow-up visit.  She is doing well on her current medications and denies hypoglycemia, visual concerns or numbness in extremities. Blood pressures at home have been controlled and she endorses compliance with her antihypertensive and exercises regularly. Her left shoulder pain has been controlled on tramadol and Flexeril as needed. She denies acute concerns today.  Past Medical History:  Diagnosis Date  . Hypertension     No past surgical history on file.  Allergies  Allergen Reactions  . Codeine Other (See Comments)    Nausea/vomitting; some dizziness     Outpatient Medications Prior to Visit  Medication Sig Dispense Refill  . aspirin (EQ ASPIRIN LOW DOSE) 81 MG chewable tablet CHEW AND SWALLOW ONE TABLET BY MOUTH ONCE DAILY 30 tablet 3  . Calcium Carbonate-Vitamin D (CALCIUM 600+D) 600-400 MG-UNIT tablet Take 1 tablet by mouth daily.    . cetaphil (CETAPHIL) lotion Apply 1 application topically as needed for dry skin. 473 mL 0  . cyclobenzaprine (FLEXERIL) 10 MG tablet Take 1 tablet (10 mg total) by mouth 2 (two) times daily as needed for muscle spasms. 90 tablet 1  . hydrocortisone 2.5 % cream Apply topically 2 (two) times daily. Apply to legs 2 times a day. Dispense 30 grams 30 g 3  . predniSONE (DELTASONE) 20 MG tablet Take 1 tablet (20 mg total) by mouth 2 (two) times daily with a meal. 10 tablet 0  . traMADol (ULTRAM) 50 MG tablet Take 1 tablet (50 mg total) by mouth every 12 (twelve) hours as needed. 50 tablet 1  . atorvastatin (LIPITOR) 20 MG tablet Take 1 tablet (20 mg total) by mouth daily. 90 tablet 1  . carvedilol (COREG) 12.5 MG tablet Take 1 tablet (12.5 mg  total) by mouth 2 (two) times daily with a meal. 180 tablet 1  . glucose blood (ONE TOUCH ULTRA TEST) test strip USE AS DIRECTED ONCE DAILY BEFORE BREAKFAST 100 each 3  . lisinopril (PRINIVIL,ZESTRIL) 5 MG tablet Take 1 tablet (5 mg total) by mouth daily. 90 tablet 1  . mirtazapine (REMERON) 15 MG tablet Take 1 tablet (15 mg total) at bedtime by mouth. 30 tablet 3  . NIFEdipine (ADALAT CC) 90 MG 24 hr tablet Take 1 tablet (90 mg total) by mouth daily. 90 tablet 1   No facility-administered medications prior to visit.     ROS Review of Systems  Constitutional: Negative for activity change, appetite change and fatigue.  HENT: Negative for congestion, sinus pressure and sore throat.   Eyes: Negative for visual disturbance.  Respiratory: Negative for cough, chest tightness, shortness of breath and wheezing.   Cardiovascular: Negative for chest pain and palpitations.  Gastrointestinal: Negative for abdominal distention, abdominal pain and constipation.  Endocrine: Negative for polydipsia.  Genitourinary: Negative for dysuria and frequency.  Musculoskeletal:       See hpi  Skin: Negative for rash.  Neurological: Negative for tremors, light-headedness and numbness.  Hematological: Does not bruise/bleed easily.  Psychiatric/Behavioral: Negative for agitation and behavioral problems.    Objective:  BP (!) 120/52   Pulse (!) 59   Temp 97.9 F (36.6 C) (Oral)   Ht 5'  2" (1.575 m)   Wt 109 lb 6.4 oz (49.6 kg)   SpO2 98%   BMI 20.01 kg/m   BP/Weight 02/14/2018 11/16/2017 01/01/7792  Systolic BP 903 009 233  Diastolic BP 52 64 60  Wt. (Lbs) 109.4 112.4 112.2  BMI 20.01 20.56 20.52      Physical Exam  Constitutional: She is oriented to person, place, and time. She appears well-developed and well-nourished.  Cardiovascular: Normal rate, normal heart sounds and intact distal pulses.  No murmur heard. Pulmonary/Chest: Effort normal and breath sounds normal. She has no wheezes. She has  no rales. She exhibits no tenderness.  Abdominal: Soft. Bowel sounds are normal. She exhibits no distension and no mass. There is no tenderness.  Musculoskeletal:  Slightly limited abduction of left shoulder  Neurological: She is alert and oriented to person, place, and time.  Skin: Skin is warm and dry.  Psychiatric: She has a normal mood and affect.     CMP Latest Ref Rng & Units 08/20/2017 05/10/2017 08/01/2016  Glucose 65 - 99 mg/dL 135(H) 121(H) 130(H)  BUN 8 - 27 mg/dL 7(L) 8 10  Creatinine 0.57 - 1.00 mg/dL 0.79 0.86 0.93  Sodium 134 - 144 mmol/L 143 142 139  Potassium 3.5 - 5.2 mmol/L 4.3 3.8 4.2  Chloride 96 - 106 mmol/L 102 100 103  CO2 20 - 29 mmol/L 24 25 25   Calcium 8.7 - 10.3 mg/dL 10.9(H) 11.0(H) 11.0(H)  Total Protein 6.0 - 8.5 g/dL 8.0 - 8.1  Total Bilirubin 0.0 - 1.2 mg/dL 0.4 - 0.5  Alkaline Phos 39 - 117 IU/L 89 - 82  AST 0 - 40 IU/L 16 - 17  ALT 0 - 32 IU/L 14 - 8    Lipid Panel     Component Value Date/Time   CHOL 163 08/20/2017 0906   TRIG 45 08/20/2017 0906   HDL 70 08/20/2017 0906   CHOLHDL 2.3 08/20/2017 0906   CHOLHDL 2.2 08/01/2016 1047   VLDL 9 08/01/2016 1047   LDLCALC 84 08/20/2017 0906    Lab Results  Component Value Date   HGBA1C 6.1 02/14/2018    Assessment & Plan:   1. Type 2 diabetes mellitus with hyperosmolarity without coma, without long-term current use of insulin (HCC) Diet controlled with A1c of 6.1 Counseled on Diabetic diet, my plate method, 007 minutes of moderate intensity exercise/week Keep blood sugar logs with fasting goals of 80-120 mg/dl, random of less than 180 and in the event of sugars less than 60 mg/dl or greater than 400 mg/dl please notify the clinic ASAP. It is recommended that you undergo annual eye exams and annual foot exams. Pneumonia vaccine is recommended. - POCT glucose (manual entry) - POCT glycosylated hemoglobin (Hb A1C)  2. Essential hypertension Controlled Counseled on blood pressure goal of  less than 130/80, low-sodium, DASH diet, medication compliance, 150 minutes of moderate intensity exercise per week. Discussed medication compliance, adverse effects. - NIFEdipine (ADALAT CC) 90 MG 24 hr tablet; Take 1 tablet (90 mg total) by mouth daily.  Dispense: 90 tablet; Refill: 1 - lisinopril (PRINIVIL,ZESTRIL) 5 MG tablet; Take 1 tablet (5 mg total) by mouth daily.  Dispense: 90 tablet; Refill: 1 - carvedilol (COREG) 12.5 MG tablet; Take 1 tablet (12.5 mg total) by mouth 2 (two) times daily with a meal.  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  Dispense:  30 tablet; Refill: 3  4. Pure hypercholesterolemia Controlled Low-cholesterol diet - atorvastatin (LIPITOR) 20 MG tablet; Take 1 tablet (20 mg total) by mouth daily.  Dispense: 90 tablet; Refill: 1   Meds ordered this encounter  Medications  . NIFEdipine (ADALAT CC) 90 MG 24 hr tablet    Sig: Take 1 tablet (90 mg total) by mouth daily.    Dispense:  90 tablet    Refill:  1  . mirtazapine (REMERON) 15 MG tablet    Sig: Take 1 tablet (15 mg total) by mouth at bedtime.    Dispense:  30 tablet    Refill:  3  . lisinopril (PRINIVIL,ZESTRIL) 5 MG tablet    Sig: Take 1 tablet (5 mg total) by mouth daily.    Dispense:  90 tablet    Refill:  1  . carvedilol (COREG) 12.5 MG tablet    Sig: Take 1 tablet (12.5 mg total) by mouth 2 (two) times daily with a meal.    Dispense:  180 tablet    Refill:  1  . atorvastatin (LIPITOR) 20 MG tablet    Sig: Take 1 tablet (20 mg total) by mouth daily.    Dispense:  90 tablet    Refill:  1  . glucose blood (ONE TOUCH ULTRA TEST) test strip    Sig: USE AS DIRECTED ONCE DAILY BEFORE BREAKFAST    Dispense:  100 each    Refill:  3    Follow-up: Return in about 3 months (around 05/17/2018) for follow up of Diabetes.   Charlott Rakes MD

## 2018-02-14 NOTE — Patient Instructions (Signed)

## 2018-04-10 ENCOUNTER — Ambulatory Visit: Payer: PPO | Admitting: Podiatry

## 2018-05-20 ENCOUNTER — Encounter: Payer: Self-pay | Admitting: Family Medicine

## 2018-05-20 ENCOUNTER — Ambulatory Visit: Payer: PPO | Attending: Family Medicine | Admitting: Family Medicine

## 2018-05-20 VITALS — BP 142/55 | HR 55 | Temp 97.9°F | Ht 62.0 in | Wt 117.4 lb

## 2018-05-20 DIAGNOSIS — Z7982 Long term (current) use of aspirin: Secondary | ICD-10-CM | POA: Diagnosis not present

## 2018-05-20 DIAGNOSIS — E1100 Type 2 diabetes mellitus with hyperosmolarity without nonketotic hyperglycemic-hyperosmolar coma (NKHHC): Secondary | ICD-10-CM

## 2018-05-20 DIAGNOSIS — G8929 Other chronic pain: Secondary | ICD-10-CM | POA: Diagnosis not present

## 2018-05-20 DIAGNOSIS — M25512 Pain in left shoulder: Secondary | ICD-10-CM | POA: Insufficient documentation

## 2018-05-20 DIAGNOSIS — E119 Type 2 diabetes mellitus without complications: Secondary | ICD-10-CM | POA: Insufficient documentation

## 2018-05-20 DIAGNOSIS — F5102 Adjustment insomnia: Secondary | ICD-10-CM | POA: Diagnosis not present

## 2018-05-20 DIAGNOSIS — E7800 Pure hypercholesterolemia, unspecified: Secondary | ICD-10-CM | POA: Diagnosis not present

## 2018-05-20 DIAGNOSIS — Z79899 Other long term (current) drug therapy: Secondary | ICD-10-CM | POA: Diagnosis not present

## 2018-05-20 DIAGNOSIS — Z885 Allergy status to narcotic agent status: Secondary | ICD-10-CM | POA: Diagnosis not present

## 2018-05-20 DIAGNOSIS — I10 Essential (primary) hypertension: Secondary | ICD-10-CM | POA: Diagnosis not present

## 2018-05-20 LAB — POCT GLYCOSYLATED HEMOGLOBIN (HGB A1C): HbA1c, POC (controlled diabetic range): 5.9 % (ref 0.0–7.0)

## 2018-05-20 LAB — GLUCOSE, POCT (MANUAL RESULT ENTRY): POC GLUCOSE: 125 mg/dL — AB (ref 70–99)

## 2018-05-20 MED ORDER — ATORVASTATIN CALCIUM 20 MG PO TABS: 20.0000 mg | ORAL_TABLET | Freq: Every day | ORAL | 1 refills | Status: DC

## 2018-05-20 MED ORDER — CARVEDILOL 12.5 MG PO TABS: 12.5000 mg | ORAL_TABLET | Freq: Two times a day (BID) | ORAL | 1 refills | Status: DC

## 2018-05-20 MED ORDER — MIRTAZAPINE 15 MG PO TABS: 15.0000 mg | ORAL_TABLET | Freq: Every day | ORAL | 1 refills | Status: DC

## 2018-05-20 MED ORDER — CYCLOBENZAPRINE HCL 10 MG PO TABS: 10.0000 mg | ORAL_TABLET | Freq: Two times a day (BID) | ORAL | 1 refills | Status: DC | PRN

## 2018-05-20 MED ORDER — LISINOPRIL 5 MG PO TABS: 5.0000 mg | ORAL_TABLET | Freq: Every day | ORAL | 1 refills | Status: DC

## 2018-05-20 MED ORDER — NIFEDIPINE ER 90 MG PO TB24: 90.0000 mg | ORAL_TABLET | Freq: Every day | ORAL | 1 refills | Status: DC

## 2018-05-20 MED ORDER — ONETOUCH ULTRASOFT LANCETS MISC: 12 refills | Status: DC

## 2018-05-20 NOTE — Progress Notes (Signed)
Subjective:  Patient ID: Lisa Taylor, female    DOB: Jan 21, 1951  Age: 67 y.o. MRN: [id number]  CC: Diabetes   HPI Lisa Taylor  is a 67 year old female with a history of type 2 diabetes mellitus (diet controlled with A1c of 5.9), hypertension, chronic left shoulder pain who presents today for a follow-up visit.  Her left shoulder has been acting up ever since the weather turned cold which for she uses Flexeril and Aleve which reduce her symptoms.  Pain has been present for greater than 40 years and she has tried physical therapy in the past with no relief. She has been compliant with her antihypertensive and states her blood pressure at home was 103/71 and fasting sugar was 109 today but whenever she goes to the doctor's office her blood pressure is elevated she states.  She denies hypoglycemia and is on diet control for management of her diabetes.  She denies numbness in extremities but does have intermittent blurry vision when reading and so on ophthalmologist no longer global plans to return as she thinks she needs reading glasses. Her insomnia is controlled on Remeron.  Past Medical History:  Diagnosis Date  . Hypertension     History reviewed. No pertinent surgical history.  Allergies  Allergen Reactions  . Codeine Other (See Comments)    Nausea/vomitting; some dizziness     Outpatient Medications Prior to Visit  Medication Sig Dispense Refill  . aspirin (EQ ASPIRIN LOW DOSE) 81 MG chewable tablet CHEW AND SWALLOW ONE TABLET BY MOUTH ONCE DAILY 30 tablet 3  . Calcium Carbonate-Vitamin D (CALCIUM 600+D) 600-400 MG-UNIT tablet Take 1 tablet by mouth daily.    . cetaphil (CETAPHIL) lotion Apply 1 application topically as needed for dry skin. 473 mL 0  . glucose blood (ONE TOUCH ULTRA TEST) test strip USE AS DIRECTED ONCE DAILY BEFORE BREAKFAST 100 each 3  . hydrocortisone 2.5 % cream Apply topically 2 (two) times daily. Apply to legs 2 times a day. Dispense 30  grams 30 g 3  . traMADol (ULTRAM) 50 MG tablet Take 1 tablet (50 mg total) by mouth every 12 (twelve) hours as needed. 50 tablet 1  . atorvastatin (LIPITOR) 20 MG tablet Take 1 tablet (20 mg total) by mouth daily. 90 tablet 1  . carvedilol (COREG) 12.5 MG tablet Take 1 tablet (12.5 mg total) by mouth 2 (two) times daily with a meal. 180 tablet 1  . cyclobenzaprine (FLEXERIL) 10 MG tablet Take 1 tablet (10 mg total) by mouth 2 (two) times daily as needed for muscle spasms. 90 tablet 1  . lisinopril (PRINIVIL,ZESTRIL) 5 MG tablet Take 1 tablet (5 mg total) by mouth daily. 90 tablet 1  . mirtazapine (REMERON) 15 MG tablet Take 1 tablet (15 mg total) by mouth at bedtime. 30 tablet 3  . NIFEdipine (ADALAT CC) 90 MG 24 hr tablet Take 1 tablet (90 mg total) by mouth daily. 90 tablet 1  . predniSONE (DELTASONE) 20 MG tablet Take 1 tablet (20 mg total) by mouth 2 (two) times daily with a meal. 10 tablet 0  . olmesartan-hydrochlorothiazide (BENICAR HCT) 20-12.5 MG tablet Take 2 tablets by mouth once. 2 tablet 0   No facility-administered medications prior to visit.     ROS Review of Systems  Constitutional: Negative for activity change, appetite change and fatigue.  HENT: Negative for congestion, sinus pressure and sore throat.   Eyes: Positive for visual disturbance.  Respiratory: Negative for cough,  chest tightness, shortness of breath and wheezing.   Cardiovascular: Negative for chest pain and palpitations.  Gastrointestinal: Negative for abdominal distention, abdominal pain and constipation.  Endocrine: Negative for polydipsia.  Genitourinary: Negative for dysuria and frequency.  Musculoskeletal:       See hpi  Skin: Negative for rash.  Neurological: Negative for tremors, light-headedness and numbness.  Hematological: Does not bruise/bleed easily.  Psychiatric/Behavioral: Negative for agitation and behavioral problems.    Objective:  BP (!) 142/55   Pulse (!) 55   Temp 97.9 F (36.6 C)  (Oral)   Ht '5\' 2"'$  (1.575 m)   Wt 117 lb 6.4 oz (53.3 kg)   SpO2 98%   BMI 21.47 kg/m   BP/Weight 05/20/2018 02/14/2018 4/46/2863  Systolic BP 817 711 657  Diastolic BP 55 52 64  Wt. (Lbs) 117.4 109.4 112.4  BMI 21.47 20.01 20.56      Physical Exam  Constitutional: She is oriented to person, place, and time. She appears well-developed and well-nourished.  Cardiovascular: Normal rate, normal heart sounds and intact distal pulses.  No murmur heard. Pulmonary/Chest: Effort normal and breath sounds normal. She has no wheezes. She has no rales. She exhibits no tenderness.  Abdominal: Soft. Bowel sounds are normal. She exhibits no distension and no mass. There is no tenderness.  Musculoskeletal:  Restriction in active abduction of the left shoulder which is limited to 80 degrees Right shoulder is normal  Neurological: She is alert and oriented to person, place, and time.  Skin: Skin is warm and dry.  Psychiatric: She has a normal mood and affect.     CMP Latest Ref Rng & Units 08/20/2017 05/10/2017 08/01/2016  Glucose 65 - 99 mg/dL 135(H) 121(H) 130(H)  BUN 8 - 27 mg/dL 7(L) 8 10  Creatinine 0.57 - 1.00 mg/dL 0.79 0.86 0.93  Sodium 134 - 144 mmol/L 143 142 139  Potassium 3.5 - 5.2 mmol/L 4.3 3.8 4.2  Chloride 96 - 106 mmol/L 102 100 103  CO2 20 - 29 mmol/L '24 25 25  '$ Calcium 8.7 - 10.3 mg/dL 10.9(H) 11.0(H) 11.0(H)  Total Protein 6.0 - 8.5 g/dL 8.0 - 8.1  Total Bilirubin 0.0 - 1.2 mg/dL 0.4 - 0.5  Alkaline Phos 39 - 117 IU/L 89 - 82  AST 0 - 40 IU/L 16 - 17  ALT 0 - 32 IU/L 14 - 8    Lipid Panel     Component Value Date/Time   CHOL 163 08/20/2017 0906   TRIG 45 08/20/2017 0906   HDL 70 08/20/2017 0906   CHOLHDL 2.3 08/20/2017 0906   CHOLHDL 2.2 08/01/2016 1047   VLDL 9 08/01/2016 1047   LDLCALC 84 08/20/2017 0906    Lab Results  Component Value Date   HGBA1C 5.9 05/20/2018    Assessment & Plan:   1. Type 2 diabetes mellitus without complication, without long-term  current use of insulin (HCC) Diet controlled with A1c of 5.9 Counseled on blood pressure goal of less than 130/80, low-sodium, DASH diet, medication compliance, 150 minutes of moderate intensity exercise per week. Discussed medication compliance, adverse effects. - POCT glucose (manual entry) - POCT glycosylated hemoglobin (Hb A1C) - CMP14+EGFR - Lipid panel - Lancets (ONETOUCH ULTRASOFT) lancets; Use as instructed  Dispense: 100 each; Refill: 12  2. Essential hypertension Controlled Counseled on blood pressure goal of less than 130/80, low-sodium, DASH diet, medication compliance, 150 minutes of moderate intensity exercise per week. Discussed medication compliance, adverse effects. - NIFEdipine (ADALAT CC) 90 MG 24  hr tablet; Take 1 tablet (90 mg total) by mouth daily.  Dispense: 90 tablet; Refill: 1 - lisinopril (PRINIVIL,ZESTRIL) 5 MG tablet; Take 1 tablet (5 mg total) by mouth daily.  Dispense: 90 tablet; Refill: 1 - carvedilol (COREG) 12.5 MG tablet; Take 1 tablet (12.5 mg total) by mouth 2 (two) times daily with a meal.  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  Dispense: 90 tablet; Refill: 1  4. Pure hypercholesterolemia Controlled Low-cholesterol diet - atorvastatin (LIPITOR) 20 MG tablet; Take 1 tablet (20 mg total) by mouth daily.  Dispense: 90 tablet; Refill: 1  5. Pain in joint of left shoulder Chronic Declines referral for PT - cyclobenzaprine (FLEXERIL) 10 MG tablet; Take 1 tablet (10 mg total) by mouth 2 (two) times daily as needed for muscle spasms.  Dispense: 90 tablet; Refill: 1   Meds ordered this encounter  Medications  . Lancets (ONETOUCH ULTRASOFT) lancets    Sig: Use as instructed    Dispense:  100 each    Refill:  12  . NIFEdipine (ADALAT CC) 90 MG 24 hr tablet    Sig: Take 1 tablet (90 mg total) by mouth daily.    Dispense:   90 tablet    Refill:  1  . lisinopril (PRINIVIL,ZESTRIL) 5 MG tablet    Sig: Take 1 tablet (5 mg total) by mouth daily.    Dispense:  90 tablet    Refill:  1  . mirtazapine (REMERON) 15 MG tablet    Sig: Take 1 tablet (15 mg total) by mouth at bedtime.    Dispense:  90 tablet    Refill:  1  . carvedilol (COREG) 12.5 MG tablet    Sig: Take 1 tablet (12.5 mg total) by mouth 2 (two) times daily with a meal.    Dispense:  180 tablet    Refill:  1  . atorvastatin (LIPITOR) 20 MG tablet    Sig: Take 1 tablet (20 mg total) by mouth daily.    Dispense:  90 tablet    Refill:  1  . cyclobenzaprine (FLEXERIL) 10 MG tablet    Sig: Take 1 tablet (10 mg total) by mouth 2 (two) times daily as needed for muscle spasms.    Dispense:  90 tablet    Refill:  1    Follow-up: Return in about 3 months (around 08/20/2018) for Follow-up of chronic medical conditions.   Charlott Rakes MD

## 2018-05-20 NOTE — Patient Instructions (Signed)
Shoulder Pain Many things can cause shoulder pain, including:  An injury.  Moving the arm in the same way again and again (overuse).  Joint pain (arthritis).  Follow these instructions at home: Take these actions to help with your pain:  Squeeze a soft ball or a foam pad as much as you can. This helps to prevent swelling. It also makes the arm stronger.  Take over-the-counter and prescription medicines only as told by your doctor.  If told, put ice on the area: ? Put ice in a plastic bag. ? Place a towel between your skin and the bag. ? Leave the ice on for 20 minutes, 2-3 times per day. Stop putting on ice if it does not help with the pain.  If you were given a shoulder sling or immobilizer: ? Wear it as told. ? Remove it to shower or bathe. ? Move your arm as little as possible. ? Keep your hand moving. This helps prevent swelling.  Contact a doctor if:  Your pain gets worse.  Medicine does not help your pain.  You have new pain in your arm, hand, or fingers. Get help right away if:  Your arm, hand, or fingers: ? Tingle. ? Are numb. ? Are swollen. ? Are painful. ? Turn white or blue. This information is not intended to replace advice given to you by your health care provider. Make sure you discuss any questions you have with your health care provider. Document Released: 12/06/2007 Document Revised: 02/13/2016 Document Reviewed: 10/12/2014 Elsevier Interactive Patient Education  2018 [employer]  

## 2018-05-21 LAB — CMP14+EGFR
ALBUMIN: 5 g/dL — AB (ref 3.6–4.8)
ALK PHOS: 101 IU/L (ref 39–117)
ALT: 49 IU/L — ABNORMAL HIGH (ref 0–32)
AST: 39 IU/L (ref 0–40)
Albumin/Globulin Ratio: 1.7 (ref 1.2–2.2)
BUN / CREAT RATIO: 15 (ref 12–28)
BUN: 15 mg/dL (ref 8–27)
Bilirubin Total: 0.5 mg/dL (ref 0.0–1.2)
CALCIUM: 10.7 mg/dL — AB (ref 8.7–10.3)
CHLORIDE: 100 mmol/L (ref 96–106)
CO2: 22 mmol/L (ref 20–29)
CREATININE: 1.01 mg/dL — AB (ref 0.57–1.00)
GFR calc Af Amer: 66 mL/min/{1.73_m2} (ref >59)
GFR calc non Af Amer: 57 mL/min/{1.73_m2} — ABNORMAL LOW (ref >59)
GLOBULIN, TOTAL: 3 g/dL (ref 1.5–4.5)
GLUCOSE: 123 mg/dL — AB (ref 65–99)
Potassium: 4.4 mmol/L (ref 3.5–5.2)
SODIUM: 139 mmol/L (ref 134–144)
Total Protein: 8 g/dL (ref 6.0–8.5)

## 2018-05-21 LAB — LIPID PANEL
CHOLESTEROL TOTAL: 185 mg/dL (ref 100–199)
Chol/HDL Ratio: 2.4 ratio (ref 0.0–4.4)
HDL: 76 mg/dL (ref >39)
LDL CALC: 99 mg/dL (ref 0–99)
TRIGLYCERIDES: 51 mg/dL (ref 0–149)
VLDL CHOLESTEROL CAL: 10 mg/dL (ref 5–40)

## 2018-05-22 ENCOUNTER — Telehealth: Payer: Self-pay

## 2018-05-22 NOTE — Telephone Encounter (Signed)
-----   Message from Charlott Rakes, MD sent at 05/21/2018  2:00 PM EST ----- Cholesterol is normal, calcium is slightly elevated but is stable compared to previous labs.  Other labs are normal.

## 2018-05-22 NOTE — Telephone Encounter (Signed)
Patient was called and informed of lab results. 

## 2018-06-14 ENCOUNTER — Ambulatory Visit: Payer: PPO | Admitting: Podiatry

## 2018-06-15 ENCOUNTER — Other Ambulatory Visit: Payer: Self-pay | Admitting: Family Medicine

## 2018-06-15 DIAGNOSIS — M25512 Pain in left shoulder: Secondary | ICD-10-CM

## 2018-07-04 ENCOUNTER — Other Ambulatory Visit: Payer: Self-pay | Admitting: Family Medicine

## 2018-07-04 DIAGNOSIS — M25512 Pain in left shoulder: Secondary | ICD-10-CM

## 2018-07-24 ENCOUNTER — Other Ambulatory Visit: Payer: Self-pay | Admitting: Family Medicine

## 2018-07-24 DIAGNOSIS — M25512 Pain in left shoulder: Secondary | ICD-10-CM

## 2018-08-06 DIAGNOSIS — H40013 Open angle with borderline findings, low risk, bilateral: Secondary | ICD-10-CM | POA: Diagnosis not present

## 2018-08-06 DIAGNOSIS — H04123 Dry eye syndrome of bilateral lacrimal glands: Secondary | ICD-10-CM | POA: Diagnosis not present

## 2018-08-06 DIAGNOSIS — E119 Type 2 diabetes mellitus without complications: Secondary | ICD-10-CM | POA: Diagnosis not present

## 2018-08-06 DIAGNOSIS — H2513 Age-related nuclear cataract, bilateral: Secondary | ICD-10-CM | POA: Diagnosis not present

## 2018-08-06 LAB — HM DIABETES EYE EXAM

## 2018-09-03 ENCOUNTER — Ambulatory Visit: Payer: PPO | Admitting: Family Medicine

## 2018-09-06 ENCOUNTER — Other Ambulatory Visit: Payer: Self-pay | Admitting: Family Medicine

## 2018-09-06 DIAGNOSIS — E7800 Pure hypercholesterolemia, unspecified: Secondary | ICD-10-CM

## 2018-09-16 ENCOUNTER — Ambulatory Visit: Payer: PPO | Attending: Family Medicine | Admitting: Family Medicine

## 2018-09-16 ENCOUNTER — Encounter: Payer: Self-pay | Admitting: Family Medicine

## 2018-09-16 VITALS — BP 130/50 | HR 79 | Temp 98.1°F | Ht 62.0 in | Wt 111.0 lb

## 2018-09-16 DIAGNOSIS — I10 Essential (primary) hypertension: Secondary | ICD-10-CM

## 2018-09-16 DIAGNOSIS — E7800 Pure hypercholesterolemia, unspecified: Secondary | ICD-10-CM

## 2018-09-16 DIAGNOSIS — F5102 Adjustment insomnia: Secondary | ICD-10-CM

## 2018-09-16 DIAGNOSIS — E119 Type 2 diabetes mellitus without complications: Secondary | ICD-10-CM | POA: Diagnosis not present

## 2018-09-16 LAB — POCT GLYCOSYLATED HEMOGLOBIN (HGB A1C): HbA1c, POC (controlled diabetic range): 6.2 % (ref 0.0–7.0)

## 2018-09-16 MED ORDER — NIFEDIPINE ER 90 MG PO TB24: 90.0000 mg | ORAL_TABLET | Freq: Every day | ORAL | 1 refills | Status: DC

## 2018-09-16 MED ORDER — CARVEDILOL 12.5 MG PO TABS: 12.5000 mg | ORAL_TABLET | Freq: Two times a day (BID) | ORAL | 1 refills | Status: DC

## 2018-09-16 MED ORDER — LISINOPRIL 5 MG PO TABS: 5.0000 mg | ORAL_TABLET | Freq: Every day | ORAL | 1 refills | Status: DC

## 2018-09-16 MED ORDER — ATORVASTATIN CALCIUM 20 MG PO TABS: 20.0000 mg | ORAL_TABLET | Freq: Every day | ORAL | 1 refills | Status: DC

## 2018-09-16 MED ORDER — MIRTAZAPINE 15 MG PO TABS: 15.0000 mg | ORAL_TABLET | Freq: Every day | ORAL | 1 refills | Status: DC

## 2018-09-16 NOTE — Progress Notes (Signed)
Subjective:  Patient ID: Lisa Taylor, female    DOB: 04/13/1951  Age: 68 y.o. MRN: [id number]  CC: Hypertension and Diabetes   HPI Lisa Taylor  is a 68 year old female with a history of type 2 diabetes mellitus (diet controlled with A1c of 6.2), hypertension, chronic left shoulder pain who presents today for a follow-up visit.  Her diabetes mellitus is diet-controlled.  Denies hypoglycemia, numbness in extremities, blurry vision and is scheduled for upcoming glaucoma surgery. Her hypertension is controlled with systolic blood pressures in the 120s at home.  Also tolerating her statin and denies adverse effects from her medications.  She stays active by watching her grandkids who make her run around. She has no additional complaints today.  Past Medical History:  Diagnosis Date  . Hypertension     No past surgical history on file.  No family history on file.  Allergies  Allergen Reactions  . Codeine Other (See Comments)    Nausea/vomitting; some dizziness    Outpatient Medications Prior to Visit  Medication Sig Dispense Refill  . aspirin (EQ ASPIRIN LOW DOSE) 81 MG chewable tablet CHEW AND SWALLOW ONE TABLET BY MOUTH ONCE DAILY 30 tablet 3  . Calcium Carbonate-Vitamin D (CALCIUM 600+D) 600-400 MG-UNIT tablet Take 1 tablet by mouth daily.    . cetaphil (CETAPHIL) lotion Apply 1 application topically as needed for dry skin. 473 mL 0  . cyclobenzaprine (FLEXERIL) 10 MG tablet TAKE 1 TABLET BY MOUTH TWICE DAILY AS NEEDED FOR MUSCLE SPASM 60 tablet 0  . glucose blood (ONE TOUCH ULTRA TEST) test strip USE AS DIRECTED ONCE DAILY BEFORE BREAKFAST 100 each 3  . hydrocortisone 2.5 % cream Apply topically 2 (two) times daily. Apply to legs 2 times a day. Dispense 30 grams 30 g 3  . Lancets (ONETOUCH ULTRASOFT) lancets Use as instructed 100 each 12  . traMADol (ULTRAM) 50 MG tablet Take 1 tablet (50 mg total) by mouth every 12 (twelve) hours as needed. 50 tablet 1  .  atorvastatin (LIPITOR) 20 MG tablet Take 1 tablet by mouth once daily 90 tablet 0  . carvedilol (COREG) 12.5 MG tablet Take 1 tablet (12.5 mg total) by mouth 2 (two) times daily with a meal. 180 tablet 1  . lisinopril (PRINIVIL,ZESTRIL) 5 MG tablet Take 1 tablet (5 mg total) by mouth daily. 90 tablet 1  . mirtazapine (REMERON) 15 MG tablet Take 1 tablet (15 mg total) by mouth at bedtime. 90 tablet 1  . NIFEdipine (ADALAT CC) 90 MG 24 hr tablet Take 1 tablet (90 mg total) by mouth daily. 90 tablet 1   No facility-administered medications prior to visit.      ROS Review of Systems General: negative for fever, weight loss, appetite change Eyes: no visual symptoms. ENT: no ear symptoms, no sinus tenderness, no nasal congestion or sore throat. Neck: no pain  Respiratory: no wheezing, shortness of breath, cough Cardiovascular: no chest pain, no dyspnea on exertion, no pedal edema, no orthopnea. Gastrointestinal: no abdominal pain, no diarrhea, no constipation Genito-Urinary: no urinary frequency, no dysuria, no polyuria. Hematologic: no bruising Endocrine: no cold or heat intolerance Neurological: no headaches, no seizures, no tremors Musculoskeletal: no joint pains, no joint swelling Skin: no pruritus, no rash. Psychological: no depression, no anxiety,    Objective:  BP (!) 130/50   Pulse 79   Temp 98.1 F (36.7 C) (Oral)   Ht 5\' 2"  (1.575 m)   Wt 111 lb (50.3  kg)   SpO2 100%   BMI 20.30 kg/m   BP/Weight 09/16/2018 05/20/2018 5/36/6440  Systolic BP 347 425 956  Diastolic BP 50 55 52  Wt. (Lbs) 111 117.4 109.4  BMI 20.3 21.47 20.01      Physical Exam Constitutional: normal appearing,  Eyes: PERRLA HEENT: Head is atraumatic, normal sinuses, normal oropharynx, normal appearing tonsils and palate, tympanic membrane is normal bilaterally. Neck: normal range of motion, no thyromegaly, no JVD Cardiovascular: normal rate and rhythm, normal heart sounds, no murmurs, rub or  gallop, no pedal edema Respiratory: Normal breath sounds, clear to auscultation bilaterally, no wheezes, no rales, no rhonchi Abdomen: soft, not tender to palpation, normal bowel sounds, no enlarged organs Musculoskeletal: Full ROM, no tenderness in joints Skin: warm and dry, no lesions. Neurological: alert, oriented x3, cranial nerves I-XII grossly intact , normal motor strength, normal sensation. Psychological: normal mood.   CMP Latest Ref Rng & Units 05/20/2018 08/20/2017 05/10/2017  Glucose 65 - 99 mg/dL 123(H) 135(H) 121(H)  BUN 8 - 27 mg/dL 15 7(L) 8  Creatinine 0.57 - 1.00 mg/dL 1.01(H) 0.79 0.86  Sodium 134 - 144 mmol/L 139 143 142  Potassium 3.5 - 5.2 mmol/L 4.4 4.3 3.8  Chloride 96 - 106 mmol/L 100 102 100  CO2 20 - 29 mmol/L 22 24 25   Calcium 8.7 - 10.3 mg/dL 10.7(H) 10.9(H) 11.0(H)  Total Protein 6.0 - 8.5 g/dL 8.0 8.0 -  Total Bilirubin 0.0 - 1.2 mg/dL 0.5 0.4 -  Alkaline Phos 39 - 117 IU/L 101 89 -  AST 0 - 40 IU/L 39 16 -  ALT 0 - 32 IU/L 49(H) 14 -    Lipid Panel     Component Value Date/Time   CHOL 185 05/20/2018 1223   TRIG 51 05/20/2018 1223   HDL 76 05/20/2018 1223   CHOLHDL 2.4 05/20/2018 1223   CHOLHDL 2.2 08/01/2016 1047   VLDL 9 08/01/2016 1047   LDLCALC 99 05/20/2018 1223    CBC    Component Value Date/Time   WBC 6.1 01/25/2015 0945   RBC 5.73 (H) 01/25/2015 0945   HGB 17.7 (H) 01/25/2015 0945   HCT 50.6 (H) 01/25/2015 0945   PLT 254 01/25/2015 0945   MCV 88.3 01/25/2015 0945   MCH 30.9 01/25/2015 0945   MCHC 35.0 01/25/2015 0945   RDW 13.3 01/25/2015 0945   LYMPHSABS 2.5 01/25/2015 0945   MONOABS 0.4 01/25/2015 0945   EOSABS 0.1 01/25/2015 0945   BASOSABS 0.0 01/25/2015 0945    Lab Results  Component Value Date   HGBA1C 6.2 09/16/2018    Assessment & Plan:   1. Type 2 diabetes mellitus without complication, without long-term current use of insulin (HCC) Controlled with A1c of 6.2 Counseled on Diabetic diet, my plate method, 387  minutes of moderate intensity exercise/week Keep blood sugar logs with fasting goals of 80-120 mg/dl, random of less than 180 and in the event of sugars less than 60 mg/dl or greater than 400 mg/dl please notify the clinic ASAP. It is recommended that you undergo annual eye exams and annual foot exams. Pneumonia vaccine is recommended. - POCT glycosylated hemoglobin (Hb A1C)  2. Essential hypertension Controlled Counseled on blood pressure goal of less than 130/80, low-sodium, DASH diet, medication compliance, 150 minutes of moderate intensity exercise per week. Discussed medication compliance, adverse effects. - carvedilol (COREG) 12.5 MG tablet; Take 1 tablet (12.5 mg total) by mouth 2 (two) times daily with a meal.  Dispense: 180 tablet; Refill:  1 - lisinopril (PRINIVIL,ZESTRIL) 5 MG tablet; Take 1 tablet (5 mg total) by mouth daily.  Dispense: 90 tablet; Refill: 1 - NIFEdipine (ADALAT CC) 90 MG 24 hr tablet; Take 1 tablet (90 mg total) by mouth daily.  Dispense: 90 tablet; Refill: 1  3. Pure hypercholesterolemia controlled Low-cholesterol diet - atorvastatin (LIPITOR) 20 MG tablet; Take 1 tablet (20 mg total) by mouth daily.  Dispense: 90 tablet; Refill: 1  4. Insomnia due to psychological stress Controlled - mirtazapine (REMERON) 15 MG tablet; Take 1 tablet (15 mg total) by mouth at bedtime.  Dispense: 90 tablet; Refill: 1   Meds ordered this encounter  Medications  . carvedilol (COREG) 12.5 MG tablet    Sig: Take 1 tablet (12.5 mg total) by mouth 2 (two) times daily with a meal.    Dispense:  180 tablet    Refill:  1  . atorvastatin (LIPITOR) 20 MG tablet    Sig: Take 1 tablet (20 mg total) by mouth daily.    Dispense:  90 tablet    Refill:  1  . lisinopril (PRINIVIL,ZESTRIL) 5 MG tablet    Sig: Take 1 tablet (5 mg total) by mouth daily.    Dispense:  90 tablet    Refill:  1  . mirtazapine (REMERON) 15 MG tablet    Sig: Take 1 tablet (15 mg total) by mouth at bedtime.     Dispense:  90 tablet    Refill:  1  . NIFEdipine (ADALAT CC) 90 MG 24 hr tablet    Sig: Take 1 tablet (90 mg total) by mouth daily.    Dispense:  90 tablet    Refill:  1    Follow-up: Return in about 6 months (around 03/19/2019) for Follow-up of chronic medical conditions.       Charlott Rakes, MD, FAAFP. [facility] [place] [facility], [place]   09/16/2018, 9:46 AM

## 2018-09-16 NOTE — Patient Instructions (Signed)

## 2019-02-18 ENCOUNTER — Other Ambulatory Visit: Payer: Self-pay | Admitting: Family Medicine

## 2019-02-18 ENCOUNTER — Other Ambulatory Visit: Payer: Self-pay | Admitting: Pharmacist

## 2019-02-18 DIAGNOSIS — I10 Essential (primary) hypertension: Secondary | ICD-10-CM

## 2019-02-18 MED ORDER — NIFEDIPINE ER 90 MG PO TB24: 90.0000 mg | ORAL_TABLET | Freq: Every day | ORAL | 0 refills | Status: DC

## 2019-03-05 ENCOUNTER — Other Ambulatory Visit: Payer: Self-pay | Admitting: Family Medicine

## 2019-03-16 ENCOUNTER — Other Ambulatory Visit: Payer: Self-pay | Admitting: Family Medicine

## 2019-03-24 ENCOUNTER — Ambulatory Visit: Payer: PPO | Admitting: Family Medicine

## 2019-04-29 DIAGNOSIS — H40013 Open angle with borderline findings, low risk, bilateral: Secondary | ICD-10-CM | POA: Diagnosis not present

## 2019-04-29 DIAGNOSIS — H43393 Other vitreous opacities, bilateral: Secondary | ICD-10-CM | POA: Diagnosis not present

## 2019-04-29 DIAGNOSIS — H524 Presbyopia: Secondary | ICD-10-CM | POA: Diagnosis not present

## 2019-04-29 DIAGNOSIS — H2513 Age-related nuclear cataract, bilateral: Secondary | ICD-10-CM | POA: Diagnosis not present

## 2019-04-29 DIAGNOSIS — H04123 Dry eye syndrome of bilateral lacrimal glands: Secondary | ICD-10-CM | POA: Diagnosis not present

## 2019-04-29 DIAGNOSIS — H5213 Myopia, bilateral: Secondary | ICD-10-CM | POA: Diagnosis not present

## 2019-05-24 ENCOUNTER — Other Ambulatory Visit: Payer: Self-pay | Admitting: Family Medicine

## 2019-05-24 DIAGNOSIS — E7800 Pure hypercholesterolemia, unspecified: Secondary | ICD-10-CM

## 2019-06-04 ENCOUNTER — Other Ambulatory Visit: Payer: Self-pay | Admitting: Family Medicine

## 2019-06-04 DIAGNOSIS — I10 Essential (primary) hypertension: Secondary | ICD-10-CM

## 2019-06-12 ENCOUNTER — Other Ambulatory Visit: Payer: Self-pay | Admitting: Family Medicine

## 2019-06-17 ENCOUNTER — Other Ambulatory Visit: Payer: Self-pay | Admitting: Family Medicine

## 2019-07-08 ENCOUNTER — Other Ambulatory Visit: Payer: Self-pay

## 2019-07-08 ENCOUNTER — Ambulatory Visit: Payer: PPO | Attending: Family Medicine | Admitting: Family Medicine

## 2019-07-08 ENCOUNTER — Encounter: Payer: Self-pay | Admitting: Family Medicine

## 2019-07-08 VITALS — BP 154/67 | HR 69 | Temp 98.2°F | Ht 62.0 in | Wt 92.4 lb

## 2019-07-08 DIAGNOSIS — L0292 Furuncle, unspecified: Secondary | ICD-10-CM

## 2019-07-08 DIAGNOSIS — I10 Essential (primary) hypertension: Secondary | ICD-10-CM | POA: Diagnosis not present

## 2019-07-08 DIAGNOSIS — F5102 Adjustment insomnia: Secondary | ICD-10-CM | POA: Diagnosis not present

## 2019-07-08 DIAGNOSIS — M25512 Pain in left shoulder: Secondary | ICD-10-CM

## 2019-07-08 DIAGNOSIS — E119 Type 2 diabetes mellitus without complications: Secondary | ICD-10-CM | POA: Diagnosis not present

## 2019-07-08 DIAGNOSIS — E7800 Pure hypercholesterolemia, unspecified: Secondary | ICD-10-CM | POA: Diagnosis not present

## 2019-07-08 LAB — POCT GLYCOSYLATED HEMOGLOBIN (HGB A1C): HbA1c, POC (controlled diabetic range): 5.8 % (ref 0.0–7.0)

## 2019-07-08 MED ORDER — MIRTAZAPINE 30 MG PO TABS: 30.0000 mg | ORAL_TABLET | Freq: Every day | ORAL | 1 refills | Status: DC

## 2019-07-08 MED ORDER — CYCLOBENZAPRINE HCL 10 MG PO TABS: ORAL_TABLET | ORAL | 2 refills | Status: DC

## 2019-07-08 MED ORDER — NIFEDIPINE ER 90 MG PO TB24: 90.0000 mg | ORAL_TABLET | Freq: Every day | ORAL | 1 refills | Status: DC

## 2019-07-08 MED ORDER — ATORVASTATIN CALCIUM 20 MG PO TABS: 20.0000 mg | ORAL_TABLET | Freq: Every day | ORAL | 1 refills | Status: DC

## 2019-07-08 MED ORDER — LISINOPRIL 5 MG PO TABS: 5.0000 mg | ORAL_TABLET | Freq: Every day | ORAL | 1 refills | Status: DC

## 2019-07-08 MED ORDER — ONETOUCH ULTRA VI STRP: ORAL_STRIP | 11 refills | Status: DC

## 2019-07-08 MED ORDER — CARVEDILOL 12.5 MG PO TABS: 12.5000 mg | ORAL_TABLET | Freq: Two times a day (BID) | ORAL | 1 refills | Status: DC

## 2019-07-08 NOTE — Patient Instructions (Signed)
Once I review your test results, I will be in touch with you via 'my chart' or a phone call from my office (if you are not signed up for my chart).

## 2019-07-08 NOTE — Progress Notes (Signed)
Subjective:  Patient ID: Lisa Taylor, female    DOB: [birth year]  Age: 69 y.o. MRN: [id number]  CC: Diabetes   HPI Kenetra Hildenbrand  is a 69 year old female with a history of type 2 diabetes mellitus (diet controlled with A1c of 5.8), hypertension, chronic left shoulder pain who presents today for a follow-up visit.   She has a boil on her sacrum which is not draining and hurts when she sits. She has back pain described as an ache in b/l thoracolumbar region worse when she stands to do dishes and is rated as moderate. She has been compliant with her antihypertensive and states her blood pressure at home has been controlled even though it is elevated today.  Endorses compliance with her antihypertensive. Also compliant with her statin; diabetes is diet controlled. Her insomnia is uncontrolled on current Remeron dose.  Past Medical History:  Diagnosis Date  . Hypertension     No past surgical history on file.  No family history on file.  Allergies  Allergen Reactions  . Codeine Other (See Comments)    Nausea/vomitting; some dizziness    Outpatient Medications Prior to Visit  Medication Sig Dispense Refill  . aspirin (EQ ASPIRIN LOW DOSE) 81 MG chewable tablet CHEW AND SWALLOW ONE TABLET BY MOUTH ONCE DAILY 30 tablet 3  . atorvastatin (LIPITOR) 20 MG tablet Take 1 tablet (20 mg total) by mouth daily. Must have office visit for refills 30 tablet 0  . Calcium Carbonate-Vitamin D (CALCIUM 600+D) 600-400 MG-UNIT tablet Take 1 tablet by mouth daily.    . carvedilol (COREG) 12.5 MG tablet Take 1 tablet (12.5 mg total) by mouth 2 (two) times daily with a meal. Please make PCP appointment. 60 tablet 0  . cetaphil (CETAPHIL) lotion Apply 1 application topically as needed for dry skin. 473 mL 0  . cyclobenzaprine (FLEXERIL) 10 MG tablet TAKE 1 TABLET BY MOUTH TWICE DAILY AS NEEDED FOR MUSCLE SPASM 60 tablet 0  . hydrocortisone 2.5 % cream Apply topically 2 (two) times daily.  Apply to legs 2 times a day. Dispense 30 grams 30 g 3  . Lancets (ONETOUCH ULTRASOFT) lancets Use as instructed 100 each 12  . lisinopril (ZESTRIL) 5 MG tablet Take 1 tablet (5 mg total) by mouth daily. Please make PCP appointment. 30 tablet 0  . mirtazapine (REMERON) 15 MG tablet Take 1 tablet (15 mg total) by mouth at bedtime. 90 tablet 1  . NIFEdipine (ADALAT CC) 90 MG 24 hr tablet Take 1 tablet (90 mg total) by mouth daily. Please make appointment. 30 tablet 0  . ONETOUCH ULTRA test strip USE AS DIRECTED ONCE DAILY BEFORE BREAKFAST 100 each 0  . traMADol (ULTRAM) 50 MG tablet Take 1 tablet (50 mg total) by mouth every 12 (twelve) hours as needed. 50 tablet 1   No facility-administered medications prior to visit.     ROS Review of Systems  Constitutional: Negative for activity change, appetite change and fatigue.  HENT: Negative for congestion, sinus pressure and sore throat.   Eyes: Negative for visual disturbance.  Respiratory: Negative for cough, chest tightness, shortness of breath and wheezing.   Cardiovascular: Negative for chest pain and palpitations.  Gastrointestinal: Negative for abdominal distention, abdominal pain and constipation.  Endocrine: Negative for polydipsia.  Genitourinary: Negative for dysuria and frequency.  Musculoskeletal: Positive for back pain.  Skin: Positive for rash.  Neurological: Negative for tremors, light-headedness and numbness.  Hematological: Does not bruise/bleed easily.  Psychiatric/Behavioral: Negative for agitation and behavioral problems.    Objective:  BP (!) 154/67   Pulse 69   Temp 98.2 F (36.8 C) (Oral)   Ht 5\' 2"  (1.575 m)   Wt 92 lb 6.4 oz (41.9 kg)   SpO2 98%   BMI 16.90 kg/m   BP/Weight 07/08/2019 09/16/2018 48/54/6270  Systolic BP 350 093 818  Diastolic BP 67 50 55  Wt. (Lbs) 92.4 111 117.4  BMI 16.9 20.3 21.47      Physical Exam Constitutional:      Appearance: She is well-developed.  Neck:     Vascular: No  JVD.  Cardiovascular:     Rate and Rhythm: Normal rate.     Heart sounds: Normal heart sounds. No murmur.  Pulmonary:     Effort: Pulmonary effort is normal.     Breath sounds: Normal breath sounds. No wheezing or rales.  Chest:     Chest wall: No tenderness.  Abdominal:     General: Bowel sounds are normal. There is no distension.     Palpations: Abdomen is soft. There is no mass.     Tenderness: There is no abdominal tenderness.  Musculoskeletal:        General: Normal range of motion.     Right lower leg: No edema.     Left lower leg: No edema.     Comments: A left shoulder range of motion restricted to 110 degrees Right shoulder is normal  Skin:    Comments: Upper medial aspect of right butt cheek with furuncle, no discharge, slightly tender  Neurological:     Mental Status: She is alert and oriented to person, place, and time.  Psychiatric:        Mood and Affect: Mood normal.     CMP Latest Ref Rng & Units 05/20/2018 08/20/2017 05/10/2017  Glucose 65 - 99 mg/dL 123(H) 135(H) 121(H)  BUN 8 - 27 mg/dL 15 7(L) 8  Creatinine 0.57 - 1.00 mg/dL 1.01(H) 0.79 0.86  Sodium 134 - 144 mmol/L 139 143 142  Potassium 3.5 - 5.2 mmol/L 4.4 4.3 3.8  Chloride 96 - 106 mmol/L 100 102 100  CO2 20 - 29 mmol/L 22 24 25   Calcium 8.7 - 10.3 mg/dL 10.7(H) 10.9(H) 11.0(H)  Total Protein 6.0 - 8.5 g/dL 8.0 8.0 -  Total Bilirubin 0.0 - 1.2 mg/dL 0.5 0.4 -  Alkaline Phos 39 - 117 IU/L 101 89 -  AST 0 - 40 IU/L 39 16 -  ALT 0 - 32 IU/L 49(H) 14 -    Lipid Panel     Component Value Date/Time   CHOL 185 05/20/2018 1223   TRIG 51 05/20/2018 1223   HDL 76 05/20/2018 1223   CHOLHDL 2.4 05/20/2018 1223   CHOLHDL 2.2 08/01/2016 1047   VLDL 9 08/01/2016 1047   LDLCALC 99 05/20/2018 1223    CBC    Component Value Date/Time   WBC 6.1 01/25/2015 0945   RBC 5.73 (H) 01/25/2015 0945   HGB 17.7 (H) 01/25/2015 0945   HCT 50.6 (H) 01/25/2015 0945   PLT 254 01/25/2015 0945   MCV 88.3 01/25/2015  0945   MCH 30.9 01/25/2015 0945   MCHC 35.0 01/25/2015 0945   RDW 13.3 01/25/2015 0945   LYMPHSABS 2.5 01/25/2015 0945   MONOABS 0.4 01/25/2015 0945   EOSABS 0.1 01/25/2015 0945   BASOSABS 0.0 01/25/2015 0945    Lab Results  Component Value Date   HGBA1C 5.8 07/08/2019    Assessment &  Plan:   1. Type 2 diabetes mellitus without complication, without long-term current use of insulin (HCC) Diet controlled with A1c of 5.8 Counseled on Diabetic diet, my plate method, 258 minutes of moderate intensity exercise/week Blood sugar logs with fasting goals of 80-120 mg/dl, random of less than 180 and in the event of sugars less than 60 mg/dl or greater than 400 mg/dl encouraged to notify the clinic. Advised on the need for annual eye exams, annual foot exams, Pneumonia vaccine. - HgB A1c - Microalbumin/Creatinine Ratio, Urine - Lipid panel - Complete Metabolic Panel with GFR - glucose blood (ONETOUCH ULTRA) test strip; USE AS DIRECTED ONCE DAILY BEFORE BREAKFAST  Dispense: 50 each; Refill: 11  2. Essential hypertension Uncontrolled Ambulatory blood pressures have been normal hence I will make no regimen changes today Counseled on blood pressure goal of less than 130/80, low-sodium, DASH diet, medication compliance, 150 minutes of moderate intensity exercise per week. Discussed medication compliance, adverse effects. - carvedilol (COREG) 12.5 MG tablet; Take 1 tablet (12.5 mg total) by mouth 2 (two) times daily with a meal.  Dispense: 180 tablet; Refill: 1 - lisinopril (ZESTRIL) 5 MG tablet; Take 1 tablet (5 mg total) by mouth daily.  Dispense: 90 tablet; Refill: 1 - NIFEdipine (ADALAT CC) 90 MG 24 hr tablet; Take 1 tablet (90 mg total) by mouth daily.  Dispense: 90 tablet; Refill: 1  3. Insomnia due to psychological stress Uncontrolled Increase Remeron dose especially since she is interested in weight gain - mirtazapine (REMERON) 30 MG tablet; Take 1 tablet (30 mg total) by mouth at  bedtime.  Dispense: 90 tablet; Refill: 1  4. Pain in joint of left shoulder Adhesive capsulitis Stable on Flexeril - cyclobenzaprine (FLEXERIL) 10 MG tablet; TAKE 1 TABLET BY MOUTH TWICE DAILY AS NEEDED FOR MUSCLE SPASM  Dispense: 60 tablet; Refill: 2  5. Pure hypercholesterolemia Controlled Low-cholesterol diet - atorvastatin (LIPITOR) 20 MG tablet; Take 1 tablet (20 mg total) by mouth daily.  Dispense: 90 tablet; Refill: 1  6. Furuncle No antibiotic indicated Advised to use warm sitz bath    No orders of the defined types were placed in this encounter.   Follow-up: Return in about 6 months (around 01/05/2020) for Chronic medical conditions.       Charlott Rakes, MD, FAAFP. [facility] and [place], [place]   07/08/2019, 3:46 PM

## 2019-07-09 LAB — CMP14+EGFR
ALT: 40 IU/L — ABNORMAL HIGH (ref 0–32)
AST: 46 IU/L — ABNORMAL HIGH (ref 0–40)
Albumin/Globulin Ratio: 1.8 (ref 1.2–2.2)
Albumin: 4.9 g/dL — ABNORMAL HIGH (ref 3.8–4.8)
Alkaline Phosphatase: 77 IU/L (ref 39–117)
BUN/Creatinine Ratio: 14 (ref 12–28)
BUN: 11 mg/dL (ref 8–27)
Bilirubin Total: 0.6 mg/dL (ref 0.0–1.2)
CO2: 24 mmol/L (ref 20–29)
Calcium: 10.2 mg/dL (ref 8.7–10.3)
Chloride: 100 mmol/L (ref 96–106)
Creatinine, Ser: 0.77 mg/dL (ref 0.57–1.00)
GFR calc Af Amer: 91 mL/min/{1.73_m2} (ref >59)
GFR calc non Af Amer: 79 mL/min/{1.73_m2} (ref >59)
Globulin, Total: 2.8 g/dL (ref 1.5–4.5)
Glucose: 99 mg/dL (ref 65–99)
Potassium: 3.6 mmol/L (ref 3.5–5.2)
Sodium: 140 mmol/L (ref 134–144)
Total Protein: 7.7 g/dL (ref 6.0–8.5)

## 2019-07-09 LAB — LIPID PANEL
Chol/HDL Ratio: 2.2 ratio (ref 0.0–4.4)
Cholesterol, Total: 176 mg/dL (ref 100–199)
HDL: 81 mg/dL (ref >39)
LDL Chol Calc (NIH): 83 mg/dL (ref 0–99)
Triglycerides: 59 mg/dL (ref 0–149)
VLDL Cholesterol Cal: 12 mg/dL (ref 5–40)

## 2019-07-09 LAB — MICROALBUMIN / CREATININE URINE RATIO
Creatinine, Urine: 117.2 mg/dL
Microalb/Creat Ratio: 220 mg/g creat — ABNORMAL HIGH (ref 0–29)
Microalbumin, Urine: 257.5 ug/mL

## 2019-07-11 ENCOUNTER — Telehealth: Payer: Self-pay

## 2019-07-11 NOTE — Telephone Encounter (Signed)
Patient was called and a voicemail was left informing patient to return phone call for lab results. 

## 2019-07-11 NOTE — Telephone Encounter (Signed)
-----   Message from Charlott Rakes, MD sent at 07/10/2019  2:00 PM EST ----- Labs reveal normal cholesterol.  Urine shows slight protein in the urine which could be due to uncontrolled hypertension and could also be due to diabetes but this has improved.  Liver enzymes are minimally elevated and there is no cause for alarm.  No regimen changes need to be made and we will just monitor her labs.

## 2019-07-17 ENCOUNTER — Telehealth: Payer: Self-pay

## 2019-07-17 NOTE — Telephone Encounter (Signed)
Patient returned phone call and was informed of lab results.

## 2019-07-17 NOTE — Telephone Encounter (Signed)
-----   Message from Charlott Rakes, MD sent at 07/10/2019  2:00 PM EST ----- Labs reveal normal cholesterol.  Urine shows slight protein in the urine which could be due to uncontrolled hypertension and could also be due to diabetes but this has improved.  Liver enzymes are minimally elevated and there is no cause for alarm.  No regimen changes need to be made and we will just monitor her labs.

## 2019-10-06 ENCOUNTER — Other Ambulatory Visit: Payer: Self-pay | Admitting: Family Medicine

## 2019-10-06 DIAGNOSIS — E119 Type 2 diabetes mellitus without complications: Secondary | ICD-10-CM

## 2019-10-28 ENCOUNTER — Other Ambulatory Visit: Payer: Self-pay | Admitting: Family Medicine

## 2019-10-28 DIAGNOSIS — E119 Type 2 diabetes mellitus without complications: Secondary | ICD-10-CM

## 2019-11-04 ENCOUNTER — Other Ambulatory Visit: Payer: Self-pay | Admitting: Family Medicine

## 2019-11-04 DIAGNOSIS — E7800 Pure hypercholesterolemia, unspecified: Secondary | ICD-10-CM

## 2019-11-05 ENCOUNTER — Other Ambulatory Visit: Payer: Self-pay | Admitting: Family Medicine

## 2019-11-05 DIAGNOSIS — E119 Type 2 diabetes mellitus without complications: Secondary | ICD-10-CM

## 2019-11-09 ENCOUNTER — Other Ambulatory Visit: Payer: Self-pay | Admitting: Family Medicine

## 2019-11-09 DIAGNOSIS — I10 Essential (primary) hypertension: Secondary | ICD-10-CM

## 2019-11-09 DIAGNOSIS — E119 Type 2 diabetes mellitus without complications: Secondary | ICD-10-CM

## 2019-11-14 ENCOUNTER — Other Ambulatory Visit: Payer: Self-pay | Admitting: Family Medicine

## 2019-11-14 DIAGNOSIS — E119 Type 2 diabetes mellitus without complications: Secondary | ICD-10-CM

## 2019-12-09 ENCOUNTER — Other Ambulatory Visit: Payer: Self-pay | Admitting: Family Medicine

## 2019-12-09 DIAGNOSIS — E7800 Pure hypercholesterolemia, unspecified: Secondary | ICD-10-CM

## 2019-12-12 ENCOUNTER — Other Ambulatory Visit: Payer: Self-pay | Admitting: Family Medicine

## 2019-12-12 DIAGNOSIS — E119 Type 2 diabetes mellitus without complications: Secondary | ICD-10-CM

## 2020-02-05 ENCOUNTER — Other Ambulatory Visit: Payer: Self-pay | Admitting: Family Medicine

## 2020-02-05 DIAGNOSIS — I10 Essential (primary) hypertension: Secondary | ICD-10-CM

## 2020-02-05 DIAGNOSIS — E7800 Pure hypercholesterolemia, unspecified: Secondary | ICD-10-CM

## 2020-02-06 NOTE — Telephone Encounter (Signed)
Attempted to call patient to schedule follow up appointment- courtesy 30 day RF given for lisinopril. Atorvastatin RF per protocol.

## 2020-02-06 NOTE — Telephone Encounter (Signed)
Requested medication (s) are due for refill today -yes  Requested medication (s) are on the active medication list -yes  Future visit scheduled -no  Last refill: 09/06/19  Notes to clinic: Attempted to call patient to schedule follow up appointment- left message to call office- Rx refilled per protocol. Nifedipine note states appointment required- sent for PCP review of request  Requested Prescriptions  Pending Prescriptions Disp Refills   NIFEdipine (ADALAT CC) 90 MG 24 hr tablet [Pharmacy Med Name: NIFEdipine ER 90 MG Oral Tablet Extended Release 24 Hour] 30 tablet 0    Sig: TAKE 1 TABLET BY MOUTH ONCE DAILY . APPOINTMENT REQUIRED FOR FUTURE REFILLS      Cardiovascular:  Calcium Channel Blockers Failed - 02/05/2020  7:08 PM      Failed - Last BP in normal range    BP Readings from Last 1 Encounters:  07/08/19 (!) 154/67          Failed - Valid encounter within last 6 months    Recent Outpatient Visits           7 months ago Type 2 diabetes mellitus without complication, without long-term current use of insulin ([place])   [place], Charlane Ferretti, MD   1 year ago Type 2 diabetes mellitus without complication, without long-term current use of insulin ([place])   [place], Charlane Ferretti, MD   1 year ago Type 2 diabetes mellitus without complication, without long-term current use of insulin ([place])   [place], Charlane Ferretti, MD   1 year ago Type 2 diabetes mellitus with hyperosmolarity without coma, without long-term current use of insulin ([place])   [place], Charlane Ferretti, MD   2 years ago Type 2 diabetes mellitus with hyperglycemia, without long-term current use of insulin ([place])   [place], Charlane Ferretti, MD               Signed Prescriptions Disp Refills   lisinopril (ZESTRIL) 5 MG tablet 30 tablet 0    Sig: Take 1  tablet by mouth once daily      Cardiovascular:  ACE Inhibitors Failed - 02/05/2020  7:08 PM      Failed - Cr in normal range and within 180 days    Creat  Date Value Ref Range Status  08/01/2016 0.93 0.50 - 0.99 mg/dL Final    Comment:      For patients > or = 70 years of age: The upper reference limit for Creatinine is approximately 13% higher for people identified as African-American.      Creatinine, Ser  Date Value Ref Range Status  07/08/2019 0.77 0.57 - 1.00 mg/dL Final          Failed - K in normal range and within 180 days    Potassium  Date Value Ref Range Status  07/08/2019 3.6 3.5 - 5.2 mmol/L Final          Failed - Last BP in normal range    BP Readings from Last 1 Encounters:  07/08/19 (!) 154/67          Failed - Valid encounter within last 6 months    Recent Outpatient Visits           7 months ago Type 2 diabetes mellitus without complication, without long-term current use of insulin ([place])   [facility] Charlott Rakes, MD  1 year ago Type 2 diabetes mellitus without complication, without long-term current use of insulin ([place])   [place], [place], MD   1 year ago Type 2 diabetes mellitus without complication, without long-term current use of insulin ([place])   [place], Charlane Ferretti, MD   1 year ago Type 2 diabetes mellitus with hyperosmolarity without coma, without long-term current use of insulin ([place])   [place], Charlane Ferretti, MD   2 years ago Type 2 diabetes mellitus with hyperglycemia, without long-term current use of insulin ([place])   [place], Charlane Ferretti, MD              Passed - Patient is not pregnant        atorvastatin (LIPITOR) 20 MG tablet 90 tablet 0    Sig: Take 1 tablet by mouth once daily      Cardiovascular:  Antilipid - Statins Failed - 02/05/2020  7:08  PM      Failed - LDL in normal range and within 360 days    LDL Chol Calc (NIH)  Date Value Ref Range Status  07/08/2019 83 0 - 99 mg/dL Final          Passed - Total Cholesterol in normal range and within 360 days    Cholesterol, Total  Date Value Ref Range Status  07/08/2019 176 100 - 199 mg/dL Final          Passed - HDL in normal range and within 360 days    HDL  Date Value Ref Range Status  07/08/2019 81 >39 mg/dL Final          Passed - Triglycerides in normal range and within 360 days    Triglycerides  Date Value Ref Range Status  07/08/2019 59 0 - 149 mg/dL Final          Passed - Patient is not pregnant      Passed - Valid encounter within last 12 months    Recent Outpatient Visits           7 months ago Type 2 diabetes mellitus without complication, without long-term current use of insulin ([place])   [place], [place], MD   1 year ago Type 2 diabetes mellitus without complication, without long-term current use of insulin ([place])   [place], [place], MD   1 year ago Type 2 diabetes mellitus without complication, without long-term current use of insulin ([place])   [place], [place], MD   1 year ago Type 2 diabetes mellitus with hyperosmolarity without coma, without long-term current use of insulin ([place])   [place], [place], MD   2 years ago Type 2 diabetes mellitus with hyperglycemia, without long-term current use of insulin ([place])   [place], Charlane Ferretti, MD                  Requested Prescriptions  Pending Prescriptions Disp Refills   NIFEdipine (ADALAT CC) 90 MG 24 hr tablet [Pharmacy Med Name: NIFEdipine ER 90 MG Oral Tablet Extended Release 24 Hour] 30 tablet 0    Sig: TAKE 1 TABLET BY MOUTH ONCE DAILY . APPOINTMENT REQUIRED FOR FUTURE REFILLS       Cardiovascular:  Calcium Channel Blockers Failed - 02/05/2020  7:08 PM  Failed - Last BP in normal range    BP Readings from Last 1 Encounters:  07/08/19 (!) 154/67          Failed - Valid encounter within last 6 months    Recent Outpatient Visits           7 months ago Type 2 diabetes mellitus without complication, without long-term current use of insulin ([place])   [place], Charlane Ferretti, MD   1 year ago Type 2 diabetes mellitus without complication, without long-term current use of insulin ([place])   [place], Charlane Ferretti, MD   1 year ago Type 2 diabetes mellitus without complication, without long-term current use of insulin ([place])   [place], Charlane Ferretti, MD   1 year ago Type 2 diabetes mellitus with hyperosmolarity without coma, without long-term current use of insulin ([place])   [place], Charlane Ferretti, MD   2 years ago Type 2 diabetes mellitus with hyperglycemia, without long-term current use of insulin ([place])   [place], Charlane Ferretti, MD               Signed Prescriptions Disp Refills   lisinopril (ZESTRIL) 5 MG tablet 30 tablet 0    Sig: Take 1 tablet by mouth once daily      Cardiovascular:  ACE Inhibitors Failed - 02/05/2020  7:08 PM      Failed - Cr in normal range and within 180 days    Creat  Date Value Ref Range Status  08/01/2016 0.93 0.50 - 0.99 mg/dL Final    Comment:      For patients > or = 70 years of age: The upper reference limit for Creatinine is approximately 13% higher for people identified as African-American.      Creatinine, Ser  Date Value Ref Range Status  07/08/2019 0.77 0.57 - 1.00 mg/dL Final          Failed - K in normal range and within 180 days    Potassium  Date Value Ref Range Status  07/08/2019 3.6 3.5 - 5.2 mmol/L Final          Failed - Last BP in normal  range    BP Readings from Last 1 Encounters:  07/08/19 (!) 154/67          Failed - Valid encounter within last 6 months    Recent Outpatient Visits           7 months ago Type 2 diabetes mellitus without complication, without long-term current use of insulin ([place])   Carmine, [place], MD   1 year ago Type 2 diabetes mellitus without complication, without long-term current use of insulin ([place])   [place], [place]mal[place], MD   1 year ago Type 2 diabetes mellitus without complication, without long-term current use of insulin ([place])   [place], [place]qu[place], MD   1 year ago Type 2 diabetes mellitus with hyperosmolarity without coma, without long-term current use of insulin ([place])   [place], Charlane Ferretti, MD   2 years ago Type 2 diabetes mellitus with hyperglycemia, without long-term current use of insulin ([place])   [place], MD              Passed - Patient  is not pregnant        atorvastatin (LIPITOR) 20 MG tablet 90 tablet 0    Sig: Take 1 tablet by mouth once daily      Cardiovascular:  Antilipid - Statins Failed - 02/05/2020  7:08 PM      Failed - LDL in normal range and within 360 days    LDL Chol Calc (NIH)  Date Value Ref Range Status  07/08/2019 83 0 - 99 mg/dL Final          Passed - Total Cholesterol in normal range and within 360 days    Cholesterol, Total  Date Value Ref Range Status  07/08/2019 176 100 - 199 mg/dL Final          Passed - HDL in normal range and within 360 days    HDL  Date Value Ref Range Status  07/08/2019 81 >39 mg/dL Final          Passed - Triglycerides in normal range and within 360 days    Triglycerides  Date Value Ref Range Status  07/08/2019 59 0 - 149 mg/dL Final          Passed - Patient is not pregnant      Passed - Valid encounter  within last 12 months    Recent Outpatient Visits           7 months ago Type 2 diabetes mellitus without complication, without long-term current use of insulin ([place])   [place], [place], MD   1 year ago Type 2 diabetes mellitus without complication, without long-term current use of insulin ([place])   [place], [place], MD   1 year ago Type 2 diabetes mellitus without complication, without long-term current use of insulin ([place])   [place], [place], MD   1 year ago Type 2 diabetes mellitus with hyperosmolarity without coma, without long-term current use of insulin ([place])   [place], [place], MD   2 years ago Type 2 diabetes mellitus with hyperglycemia, without long-term current use of insulin ([place])   [facility] Charlott Rakes, MD

## 2020-02-08 ENCOUNTER — Other Ambulatory Visit: Payer: Self-pay | Admitting: Family Medicine

## 2020-02-08 DIAGNOSIS — E119 Type 2 diabetes mellitus without complications: Secondary | ICD-10-CM

## 2020-02-08 NOTE — Telephone Encounter (Signed)
Requested Prescriptions  Pending Prescriptions Disp Refills  . ONETOUCH ULTRA test strip [employer] Med Name: OneTouch Ultra Blue In Vitro Strip] 23 each 5    Sig: USE AS DIRECTED ONCE DAILY BEFORE BREAKFAST.     Endocrinology: Diabetes - Testing Supplies Passed - 02/08/2020  4:06 PM      Passed - Valid encounter within last 12 months    Recent Outpatient Visits          7 months ago Type 2 diabetes mellitus without complication, without long-term current use of insulin ([place])   [place], [place], MD   1 year ago Type 2 diabetes mellitus without complication, without long-term current use of insulin ([place])   [place], [place], MD   1 year ago Type 2 diabetes mellitus without complication, without long-term current use of insulin ([place])   [place], [place], MD   1 year ago Type 2 diabetes mellitus with hyperosmolarity without coma, without long-term current use of insulin ([place])   [place], Charlane Ferretti, MD   2 years ago Type 2 diabetes mellitus with hyperglycemia, without long-term current use of insulin [facility])   [facility] Charlott Rakes, MD

## 2020-03-30 ENCOUNTER — Ambulatory Visit: Payer: PPO | Admitting: Family Medicine

## 2020-03-31 ENCOUNTER — Other Ambulatory Visit: Payer: Self-pay | Admitting: Family Medicine

## 2020-03-31 DIAGNOSIS — I10 Essential (primary) hypertension: Secondary | ICD-10-CM

## 2020-05-10 ENCOUNTER — Other Ambulatory Visit: Payer: Self-pay | Admitting: Family Medicine

## 2020-05-10 DIAGNOSIS — I10 Essential (primary) hypertension: Secondary | ICD-10-CM

## 2020-05-10 DIAGNOSIS — E7800 Pure hypercholesterolemia, unspecified: Secondary | ICD-10-CM

## 2020-05-10 NOTE — Telephone Encounter (Signed)
Requested Prescriptions  Pending Prescriptions Disp Refills   atorvastatin (LIPITOR) 20 MG tablet [Pharmacy Med Name: Atorvastatin Calcium 20 MG Oral Tablet] 90 tablet 0    Sig: Take 1 tablet by mouth once daily     Cardiovascular:  Antilipid - Statins Failed - 05/10/2020  5:17 PM      Failed - LDL in normal range and within 360 days    LDL Chol Calc (NIH)  Date Value Ref Range Status  07/08/2019 83 0 - 99 mg/dL Final         Passed - Total Cholesterol in normal range and within 360 days    Cholesterol, Total  Date Value Ref Range Status  07/08/2019 176 100 - 199 mg/dL Final         Passed - HDL in normal range and within 360 days    HDL  Date Value Ref Range Status  07/08/2019 81 >39 mg/dL Final         Passed - Triglycerides in normal range and within 360 days    Triglycerides  Date Value Ref Range Status  07/08/2019 59 0 - 149 mg/dL Final         Passed - Patient is not pregnant      Passed - Valid encounter within last 12 months    Recent Outpatient Visits          10 months ago Type 2 diabetes mellitus without complication, without long-term current use of insulin ([place])   [place], [place], MD   1 year ago Type 2 diabetes mellitus without complication, without long-term current use of insulin ([place])   [place], [place], MD   1 year ago Type 2 diabetes mellitus without complication, without long-term current use of insulin ([place])   [place], [place], MD   2 years ago Type 2 diabetes mellitus with hyperosmolarity without coma, without long-term current use of insulin ([place])   [place], [place], MD   2 years ago Type 2 diabetes mellitus with hyperglycemia, without long-term current use of insulin ([place])   [place], Charlane Ferretti, MD      Future Appointments            In 1 week  Charlott Rakes, MD [place]            carvedilol (COREG) 12.5 MG tablet [Pharmacy Med Name: Carvedilol 12.5 MG Oral Tablet] 9 tablet 0    Sig: TAKE 1 TABLET BY MOUTH TWICE DAILY WITH MEALS     Cardiovascular:  Beta Blockers Failed - 05/10/2020  5:17 PM      Failed - Last BP in normal range    BP Readings from Last 1 Encounters:  07/08/19 (!) 154/67         Failed - Valid encounter within last 6 months    Recent Outpatient Visits          10 months ago Type 2 diabetes mellitus without complication, without long-term current use of insulin ( [place])   [place], [place], MD   1 year ago Type 2 diabetes mellitus without complication, without long-term current use of insulin ([place])   [place], Charlane Ferretti, MD   1 year ago Type 2 diabetes mellitus without complication, without long-term current use of insulin ([place])  [place], Charlane Ferretti, MD   2 years ago Type 2 diabetes mellitus with hyperosmolarity without coma, without long-term current use of insulin ([place])   [place], Charlane Ferretti, MD   2 years ago Type 2 diabetes mellitus with hyperglycemia, without long-term current use of insulin ([place])   [place], Charlane Ferretti, MD      Future Appointments            In 1 week Charlott Rakes, MD [place] - Last Heart Rate in normal range    Pulse Readings from Last 1 Encounters:  07/08/19 69          lisinopril (ZESTRIL) 5 MG tablet [Pharmacy Med Name: Lisinopril 5 MG Oral Tablet] 9 tablet 0    Sig: Take 1 tablet by mouth once daily     Cardiovascular:  ACE Inhibitors Failed - 05/10/2020  5:17 PM      Failed - Cr in normal range and within 180 days    Creat  Date Value Ref Range Status  08/01/2016 0.93 0.50 - 0.99 mg/dL  Final    Comment:      For patients > or = 70 years of age: The upper reference limit for Creatinine is approximately 13% higher for people identified as African-American.      Creatinine, Ser  Date Value Ref Range Status  07/08/2019 0.77 0.57 - 1.00 mg/dL Final         Failed - K in normal range and within 180 days    Potassium  Date Value Ref Range Status  07/08/2019 3.6 3.5 - 5.2 mmol/L Final         Failed - Last BP in normal range    BP Readings from Last 1 Encounters:  07/08/19 (!) 154/67         Failed - Valid encounter within last 6 months    Recent Outpatient Visits          10 months ago Type 2 diabetes mellitus without complication, without long-term current use of insulin ([place])   [place], [place], MD   1 year ago Type 2 diabetes mellitus without complication, without long-term current use of insulin ([place])   [place], Amherst, MD   1 year ago Type 2 diabetes mellitus without complication, without long-term current use of insulin ([place])   Henderson, [place], MD   2 years ago Type 2 diabetes mellitus with hyperosmolarity without coma, without long-term current use of insulin ([place])   [place], [place], MD   2 years ago Type 2 diabetes mellitus with hyperglycemia, without long-term current use of insulin ([place])   [place], Enobong, MD      Future Appointments            In 1 week Charlott Rakes, MD [place] - Patient is not pregnant

## 2020-05-11 ENCOUNTER — Other Ambulatory Visit: Payer: Self-pay | Admitting: Family Medicine

## 2020-05-11 DIAGNOSIS — I10 Essential (primary) hypertension: Secondary | ICD-10-CM

## 2020-05-18 ENCOUNTER — Encounter: Payer: Self-pay | Admitting: Family Medicine

## 2020-05-18 ENCOUNTER — Ambulatory Visit: Payer: PPO | Attending: Family Medicine | Admitting: Family Medicine

## 2020-05-18 ENCOUNTER — Other Ambulatory Visit: Payer: Self-pay | Admitting: Pharmacist

## 2020-05-18 ENCOUNTER — Other Ambulatory Visit: Payer: Self-pay

## 2020-05-18 VITALS — BP 145/64 | HR 60 | Temp 98.0°F | Ht 62.0 in | Wt 84.4 lb

## 2020-05-18 DIAGNOSIS — E1169 Type 2 diabetes mellitus with other specified complication: Secondary | ICD-10-CM

## 2020-05-18 DIAGNOSIS — M7502 Adhesive capsulitis of left shoulder: Secondary | ICD-10-CM | POA: Diagnosis not present

## 2020-05-18 DIAGNOSIS — F5102 Adjustment insomnia: Secondary | ICD-10-CM | POA: Diagnosis not present

## 2020-05-18 DIAGNOSIS — I152 Hypertension secondary to endocrine disorders: Secondary | ICD-10-CM | POA: Diagnosis not present

## 2020-05-18 DIAGNOSIS — Z1211 Encounter for screening for malignant neoplasm of colon: Secondary | ICD-10-CM

## 2020-05-18 DIAGNOSIS — E43 Unspecified severe protein-calorie malnutrition: Secondary | ICD-10-CM | POA: Diagnosis not present

## 2020-05-18 DIAGNOSIS — E1159 Type 2 diabetes mellitus with other circulatory complications: Secondary | ICD-10-CM

## 2020-05-18 DIAGNOSIS — Z1231 Encounter for screening mammogram for malignant neoplasm of breast: Secondary | ICD-10-CM

## 2020-05-18 DIAGNOSIS — E785 Hyperlipidemia, unspecified: Secondary | ICD-10-CM

## 2020-05-18 DIAGNOSIS — R413 Other amnesia: Secondary | ICD-10-CM

## 2020-05-18 LAB — POCT GLYCOSYLATED HEMOGLOBIN (HGB A1C): HbA1c, POC (prediabetic range): 5.8 % (ref 5.7–6.4)

## 2020-05-18 MED ORDER — CYCLOBENZAPRINE HCL 10 MG PO TABS: ORAL_TABLET | ORAL | 2 refills | Status: DC

## 2020-05-18 MED ORDER — MEMANTINE HCL 5 MG PO TABS: 5.0000 mg | ORAL_TABLET | Freq: Two times a day (BID) | ORAL | 6 refills | Status: DC

## 2020-05-18 MED ORDER — LISINOPRIL 5 MG PO TABS: 5.0000 mg | ORAL_TABLET | Freq: Every day | ORAL | 1 refills | Status: DC

## 2020-05-18 MED ORDER — ATORVASTATIN CALCIUM 20 MG PO TABS: 20.0000 mg | ORAL_TABLET | Freq: Every day | ORAL | 1 refills | Status: DC

## 2020-05-18 MED ORDER — NIFEDIPINE ER 90 MG PO TB24: 90.0000 mg | ORAL_TABLET | Freq: Every day | ORAL | 1 refills | Status: DC

## 2020-05-18 MED ORDER — CARVEDILOL 12.5 MG PO TABS: 12.5000 mg | ORAL_TABLET | Freq: Two times a day (BID) | ORAL | 1 refills | Status: DC

## 2020-05-18 MED ORDER — ONETOUCH ULTRA 2 W/DEVICE KIT: PACK | 0 refills | Status: AC

## 2020-05-18 MED ORDER — ONETOUCH DELICA LANCETS 33G MISC: 2 refills | Status: AC

## 2020-05-18 MED ORDER — TRAMADOL HCL 50 MG PO TABS: 50.0000 mg | ORAL_TABLET | Freq: Two times a day (BID) | ORAL | 1 refills | Status: AC | PRN

## 2020-05-18 MED ORDER — MIRTAZAPINE 30 MG PO TABS: 30.0000 mg | ORAL_TABLET | Freq: Every day | ORAL | 1 refills | Status: DC

## 2020-05-18 NOTE — Progress Notes (Signed)
States that she is not eating.  Very forgetfull

## 2020-05-18 NOTE — Patient Instructions (Signed)
Dementia Caregiver Guide Dementia is a term used to describe a number of symptoms that affect memory and thinking. The most common symptoms include:  Memory loss.  Trouble with language and communication.  Trouble concentrating.  Poor judgment.  Problems with reasoning.  Child-like behavior and language.  Extreme anxiety.  Angry outbursts.  Wandering from home or public places. Dementia usually gets worse slowly over time. In the early stages, people with dementia can stay independent and safe with some help. In later stages, they need help with daily tasks such as dressing, grooming, and using the bathroom. How to help the person with dementia cope Dementia can be frightening and confusing. Here are some tips to help the person with dementia cope with changes caused by the disease. General tips  Keep the person on track with his or her routine.  Try to identify areas where the person may need help.  Be supportive, patient, calm, and encouraging.  Gently remind the person that adjusting to changes takes time.  Help with the tasks that the person has asked for help with.  Keep the person involved in daily tasks and decisions as much as possible.  Encourage conversation, but try not to get frustrated or harried if the person struggles to find words or does not seem to appreciate your help. Communication tips  When the person is talking or seems frustrated, make eye contact and hold the person's hand.  Ask specific questions that need yes or no answers.  Use simple words, short sentences, and a calm voice. Only give one direction at a time.  When offering choices, limit them to just 1 or 2.  Avoid correcting the person in a negative way.  If the person is struggling to find the right words, gently try to help him or her. How to recognize symptoms of stress Symptoms of stress in caregivers include:  Feeling frustrated or angry with the person with  dementia.  Denying that the person has dementia or that his or her symptoms will not improve.  Feeling hopeless and unappreciated.  Difficulty sleeping.  Difficulty concentrating.  Feeling anxious, irritable, or depressed.  Developing stress-related health problems.  Feeling like you have too little time for your own life. Follow these instructions at home:   Make sure that you and the person you are caring for: ? Get regular sleep. ? Exercise regularly. ? Eat regular, nutritious meals. ? Drink enough fluid to keep your urine clear or pale yellow. ? Take over-the-counter and prescription medicines only as told by your health care providers. ? Attend all scheduled health care appointments.  Join a support group with others who are caregivers.  Ask about respite care resources so that you can have a regular break from the stress of caregiving.  Look for signs of stress in yourself and in the person you are caring for. If you notice signs of stress, take steps to manage it.  Consider any safety risks and take steps to avoid them.  Organize medications in a pill box for each day of the week.  Create a plan to handle any legal or financial matters. Get legal or financial advice if needed.  Keep a calendar in a central location to remind the person of appointments or other activities. Tips for reducing the risk of injury  Keep floors clear of clutter. Remove rugs, magazine racks, and floor lamps.  Keep hallways well lit, especially at night.  Put a handrail and nonslip mat in the bathtub or  shower.  Put childproof locks on cabinets that contain dangerous items, such as medicines, alcohol, guns, toxic cleaning items, sharp tools or utensils, matches, and lighters.  Put the locks in places where the person cannot see or reach them easily. This will help ensure that the person does not wander out of the house and get lost.  Be prepared for emergencies. Keep a list of  emergency phone numbers and addresses in a convenient area.  Remove car keys and lock garage doors so that the person does not try to get in the car and drive.  Have the person wear a bracelet that tracks locations and identifies the person as having memory problems. This should be worn at all times for safety. Where to find support: Many individuals and organizations offer support. These include:  Support groups for people with dementia and for caregivers.  Counselors or therapists.  Home health care services.  Adult day care centers. Where to find more information Alzheimer's Association: [employer] Contact a health care provider if:  The person's health is rapidly getting worse.  You are no longer able to care for the person.  Caring for the person is affecting your physical and emotional health.  The person threatens himself or herself, you, or anyone else. Summary  Dementia is a term used to describe a number of symptoms that affect memory and thinking.  Dementia usually gets worse slowly over time.  Take steps to reduce the person's risk of injury, and to plan for future care.  Caregivers need support, relief from caregiving, and time for their own lives. This information is not intended to replace advice given to you by your health care provider. Make sure you discuss any questions you have with your health care provider. Document Revised: 06/01/2017 Document Reviewed: 05/23/2016 Elsevier Patient Education  2020 [employer].

## 2020-05-18 NOTE — Addendum Note (Signed)
Addended by: Charlott Rakes on: 05/18/2020 05:29 PM   Modules accepted: Level of Service

## 2020-05-18 NOTE — Progress Notes (Addendum)
Subjective:  Patient ID: Lisa Taylor, female    DOB: 12-15-1950  Age: 69 y.o. MRN: [id number]  CC: Diabetes   HPI Lisa Taylor  is a 69 year old female with a history of type 2 diabetes mellitus (diet controlled with A1c of5.8), hypertension, chronic left shoulder pain who presents today for a follow-up visit. Her husband pased away 10 weeks ago suddenly of cancer. Her appetitie has been reduced and she has lost weight  Today she is accompanied by her daughter who complains that she has been forgetful. She is forgetting her PIN numbers and having to change her bank cards frequently, she forgot the location of the Clinic, forgot my name which is unusual for her but was able to identify my picture in the clinic when she came in. She endorses compliance with her antihypertensive. Her left shoulder continues to hurt and she has run out of her muscle relaxant and pain medication.  Past Medical History:  Diagnosis Date  . Hypertension     History reviewed. No pertinent surgical history.  History reviewed. No pertinent family history.  Allergies  Allergen Reactions  . Codeine Other (See Comments)    Nausea/vomitting; some dizziness    Outpatient Medications Prior to Visit  Medication Sig Dispense Refill  . aspirin (EQ ASPIRIN LOW DOSE) 81 MG chewable tablet CHEW AND SWALLOW ONE TABLET BY MOUTH ONCE DAILY 30 tablet 3  . Calcium Carbonate-Vitamin D (CALCIUM 600+D) 600-400 MG-UNIT tablet Take 1 tablet by mouth daily.    . cetaphil (CETAPHIL) lotion Apply 1 application topically as needed for dry skin. 473 mL 0  . hydrocortisone 2.5 % cream Apply topically 2 (two) times daily. Apply to legs 2 times a day. Dispense 30 grams 30 g 3  . ONETOUCH ULTRA test strip USE AS DIRECTED ONCE DAILY BEFORE BREAKFAST. 50 each 5  . atorvastatin (LIPITOR) 20 MG tablet Take 1 tablet by mouth once daily 90 tablet 0  . carvedilol (COREG) 12.5 MG tablet TAKE 1 TABLET BY MOUTH TWICE DAILY  WITH MEALS 9 tablet 0  . cyclobenzaprine (FLEXERIL) 10 MG tablet TAKE 1 TABLET BY MOUTH TWICE DAILY AS NEEDED FOR MUSCLE SPASM 60 tablet 2  . Lancets (ONETOUCH ULTRASOFT) lancets Use as instructed 100 each 12  . lisinopril (ZESTRIL) 5 MG tablet Take 1 tablet by mouth once daily 9 tablet 0  . mirtazapine (REMERON) 30 MG tablet Take 1 tablet (30 mg total) by mouth at bedtime. 90 tablet 1  . NIFEdipine (ADALAT CC) 90 MG 24 hr tablet Take 1 tablet by mouth once daily 90 tablet 0  . traMADol (ULTRAM) 50 MG tablet Take 1 tablet (50 mg total) by mouth every 12 (twelve) hours as needed. 50 tablet 1   No facility-administered medications prior to visit.     ROS Review of Systems  Constitutional: Negative for activity change, appetite change and fatigue.  HENT: Negative for congestion, sinus pressure and sore throat.   Eyes: Negative for visual disturbance.  Respiratory: Negative for cough, chest tightness, shortness of breath and wheezing.   Cardiovascular: Negative for chest pain and palpitations.  Gastrointestinal: Negative for abdominal distention, abdominal pain and constipation.  Endocrine: Negative for polydipsia.  Genitourinary: Negative for dysuria and frequency.  Musculoskeletal:       See HPI  Skin: Negative for rash.  Neurological: Negative for tremors, light-headedness and numbness.  Hematological: Does not bruise/bleed easily.  Psychiatric/Behavioral: Negative for agitation and behavioral problems.    Objective:  BP (!) 145/64   Pulse 60   Temp 98 F (36.7 C) (Oral)   Ht $R'5\' 2"'IO$  (1.575 m)   Wt 84 lb 6.4 oz (38.3 kg)   SpO2 100%   BMI 15.44 kg/m   BP/Weight 05/18/2020 07/08/2019 10/27/8339  Systolic BP 962 229 798  Diastolic BP 64 67 50  Wt. (Lbs) 84.4 92.4 111  BMI 15.44 16.9 20.3      Physical Exam Constitutional:      Appearance: She is well-developed.  Neck:     Vascular: No JVD.  Cardiovascular:     Rate and Rhythm: Normal rate.     Heart sounds: Normal  heart sounds. No murmur heard.   Pulmonary:     Effort: Pulmonary effort is normal.     Breath sounds: Normal breath sounds. No wheezing or rales.  Chest:     Chest wall: No tenderness.  Abdominal:     General: Bowel sounds are normal. There is no distension.     Palpations: Abdomen is soft. There is no mass.     Tenderness: There is no abdominal tenderness.  Musculoskeletal:        General: Normal range of motion.     Right lower leg: No edema.     Left lower leg: No edema.     Comments: Restricted range of motion in left upper extremity Right is normal  Neurological:     Mental Status: She is alert and oriented to person, place, and time.  Psychiatric:        Mood and Affect: Mood normal.     CMP Latest Ref Rng & Units 07/08/2019 05/20/2018 08/20/2017  Glucose 65 - 99 mg/dL 99 123(H) 135(H)  BUN 8 - 27 mg/dL 11 15 7(L)  Creatinine 0.57 - 1.00 mg/dL 0.77 1.01(H) 0.79  Sodium 134 - 144 mmol/L 140 139 143  Potassium 3.5 - 5.2 mmol/L 3.6 4.4 4.3  Chloride 96 - 106 mmol/L 100 100 102  CO2 20 - 29 mmol/L $RemoveB'24 22 24  'kJptsDeP$ Calcium 8.7 - 10.3 mg/dL 10.2 10.7(H) 10.9(H)  Total Protein 6.0 - 8.5 g/dL 7.7 8.0 8.0  Total Bilirubin 0.0 - 1.2 mg/dL 0.6 0.5 0.4  Alkaline Phos 39 - 117 IU/L 77 101 89  AST 0 - 40 IU/L 46(H) 39 16  ALT 0 - 32 IU/L 40(H) 49(H) 14    Lipid Panel     Component Value Date/Time   CHOL 176 07/08/2019 1630   TRIG 59 07/08/2019 1630   HDL 81 07/08/2019 1630   CHOLHDL 2.2 07/08/2019 1630   CHOLHDL 2.2 08/01/2016 1047   VLDL 9 08/01/2016 1047   LDLCALC 83 07/08/2019 1630    CBC    Component Value Date/Time   WBC 6.1 01/25/2015 0945   RBC 5.73 (H) 01/25/2015 0945   HGB 17.7 (H) 01/25/2015 0945   HCT 50.6 (H) 01/25/2015 0945   PLT 254 01/25/2015 0945   MCV 88.3 01/25/2015 0945   MCH 30.9 01/25/2015 0945   MCHC 35.0 01/25/2015 0945   RDW 13.3 01/25/2015 0945   LYMPHSABS 2.5 01/25/2015 0945   MONOABS 0.4 01/25/2015 0945   EOSABS 0.1 01/25/2015 0945    BASOSABS 0.0 01/25/2015 0945    Lab Results  Component Value Date   HGBA1C 5.8 05/18/2020    MMSE - Mini Mental State Exam 05/18/2020  Orientation to time 4  Orientation to Place 4  Registration 3  Attention/ Calculation 0  Recall 0  Language- name 2 objects 2  Language- repeat 1  Language- follow 3 step command 3  Language- read & follow direction 1  Write a sentence 1  Copy design 1  Total score 20      Assessment & Plan:  1. Type 2 diabetes mellitus with other specified complication, without long-term current use of insulin (HCC) Controlled with A1c of 5.8 She is on diet control Counseled on Diabetic diet, my plate method, 888 minutes of moderate intensity exercise/week Blood sugar logs with fasting goals of 80-120 mg/dl, random of less than 180 and in the event of sugars less than 60 mg/dl or greater than 400 mg/dl encouraged to notify the clinic. Advised on the need for annual eye exams, annual foot exams, Pneumonia vaccine. - POCT glycosylated hemoglobin (Hb A1C) - CMP14+EGFR  2. Insomnia due to psychological stress Triggered by recent bereavement She has been out of Remeron which I have refilled Offered grief counseling and LCSW referral for therapy but she declined - mirtazapine (REMERON) 30 MG tablet; Take 1 tablet (30 mg total) by mouth at bedtime.  Dispense: 90 tablet; Refill: 1  3. Hypertension complicating diabetes ([place]) Slightly above goal No regimen change today Consider increasing lisinopril at next visit if still elevated Counseled on blood pressure goal of less than 130/80, low-sodium, DASH diet, medication compliance, 150 minutes of moderate intensity exercise per week. Discussed medication compliance, adverse effects. - NIFEdipine (ADALAT CC) 90 MG 24 hr tablet; Take 1 tablet (90 mg total) by mouth daily.  Dispense: 90 tablet; Refill: 1 - carvedilol (COREG) 12.5 MG tablet; Take 1 tablet (12.5 mg total) by mouth 2 (two) times daily with a meal.   Dispense: 180 tablet; Refill: 1 - lisinopril (ZESTRIL) 5 MG tablet; Take 1 tablet (5 mg total) by mouth daily.  Dispense: 90 tablet; Refill: 1  4. Adhesive capsulitis of left shoulder Uncontrolled due to running out of pain medications which I have refilled - cyclobenzaprine (FLEXERIL) 10 MG tablet; TAKE 1 TABLET BY MOUTH TWICE DAILY AS NEEDED FOR MUSCLE SPASM  Dispense: 60 tablet; Refill: 2 - traMADol (ULTRAM) 50 MG tablet; Take 1 tablet (50 mg total) by mouth every 12 (twelve) hours as needed.  Dispense: 50 tablet; Refill: 1  5. Hyperlipidemia associated with type 2 diabetes mellitus (HCC) Controlled Low-cholesterol diet - atorvastatin (LIPITOR) 20 MG tablet; Take 1 tablet (20 mg total) by mouth daily.  Dispense: 90 tablet; Refill: 1  6. Severe protein-calorie malnutrition ([place]) Worsened by recent bereavement We will check for underlying cause We will also screen for malignancy - TSH - T4, free - Ambulatory referral to Gastroenterology  7. Encounter for screening mammogram for malignant neoplasm of breast - MM 3D SCREEN BREAST BILATERAL; Future  8. Screening for colon cancer - Ambulatory referral to Gastroenterology  9. Memory loss Moderate dementia Suddenly triggered by bereavement - memantine (NAMENDA) 5 MG tablet; Take 1 tablet (5 mg total) by mouth 2 (two) times daily.  Dispense: 30 tablet; Refill: 6    Meds ordered this encounter  Medications  . mirtazapine (REMERON) 30 MG tablet    Sig: Take 1 tablet (30 mg total) by mouth at bedtime.    Dispense:  90 tablet    Refill:  1  . NIFEdipine (ADALAT CC) 90 MG 24 hr tablet    Sig: Take 1 tablet (90 mg total) by mouth daily.    Dispense:  90 tablet    Refill:  1  . cyclobenzaprine (FLEXERIL) 10 MG tablet    Sig: TAKE 1 TABLET  BY MOUTH TWICE DAILY AS NEEDED FOR MUSCLE SPASM    Dispense:  60 tablet    Refill:  2  . atorvastatin (LIPITOR) 20 MG tablet    Sig: Take 1 tablet (20 mg total) by mouth daily.    Dispense:  90  tablet    Refill:  1  . carvedilol (COREG) 12.5 MG tablet    Sig: Take 1 tablet (12.5 mg total) by mouth 2 (two) times daily with a meal.    Dispense:  180 tablet    Refill:  1  . lisinopril (ZESTRIL) 5 MG tablet    Sig: Take 1 tablet (5 mg total) by mouth daily.    Dispense:  90 tablet    Refill:  1  . traMADol (ULTRAM) 50 MG tablet    Sig: Take 1 tablet (50 mg total) by mouth every 12 (twelve) hours as needed.    Dispense:  50 tablet    Refill:  1  . memantine (NAMENDA) 5 MG tablet    Sig: Take 1 tablet (5 mg total) by mouth 2 (two) times daily.    Dispense:  30 tablet    Refill:  6    Follow-up: Return in about 3 months (around 08/18/2020) for chronic disease management.       Charlott Rakes, MD, FAAFP. [facility] and [place], [place]   05/18/2020, 5:26 PM

## 2020-05-19 LAB — CMP14+EGFR
ALT: 32 IU/L (ref 0–32)
AST: 33 IU/L (ref 0–40)
Albumin/Globulin Ratio: 1.6 (ref 1.2–2.2)
Albumin: 5.1 g/dL — ABNORMAL HIGH (ref 3.8–4.8)
Alkaline Phosphatase: 67 IU/L (ref 44–121)
BUN/Creatinine Ratio: 18 (ref 12–28)
BUN: 16 mg/dL (ref 8–27)
Bilirubin Total: 0.4 mg/dL (ref 0.0–1.2)
CO2: 26 mmol/L (ref 20–29)
Calcium: 10.5 mg/dL — ABNORMAL HIGH (ref 8.7–10.3)
Chloride: 100 mmol/L (ref 96–106)
Creatinine, Ser: 0.88 mg/dL (ref 0.57–1.00)
GFR calc Af Amer: 77 mL/min/{1.73_m2} (ref >59)
GFR calc non Af Amer: 67 mL/min/{1.73_m2} (ref >59)
Globulin, Total: 3.1 g/dL (ref 1.5–4.5)
Glucose: 162 mg/dL — ABNORMAL HIGH (ref 65–99)
Potassium: 4.5 mmol/L (ref 3.5–5.2)
Sodium: 141 mmol/L (ref 134–144)
Total Protein: 8.2 g/dL (ref 6.0–8.5)

## 2020-05-19 LAB — T4, FREE: Free T4: 1.59 ng/dL (ref 0.82–1.77)

## 2020-05-19 LAB — TSH: TSH: 0.835 u[IU]/mL (ref 0.450–4.500)

## 2020-05-21 ENCOUNTER — Telehealth: Payer: Self-pay

## 2020-05-21 NOTE — Telephone Encounter (Signed)
Pt was called and informed of stable lab via voicemail.

## 2020-05-21 NOTE — Telephone Encounter (Signed)
-----   Message from Charlott Rakes, MD sent at 05/20/2020  1:46 PM EST ----- Labs are stable

## 2020-07-30 ENCOUNTER — Ambulatory Visit
Admission: RE | Admit: 2020-07-30 | Discharge: 2020-07-30 | Disposition: A | Discharge status: Home / Self Care | Payer: PPO | Source: Ambulatory Visit | Attending: Family Medicine | Admitting: Family Medicine

## 2020-07-30 ENCOUNTER — Other Ambulatory Visit: Payer: Self-pay

## 2020-07-30 DIAGNOSIS — Z1231 Encounter for screening mammogram for malignant neoplasm of breast: Secondary | ICD-10-CM

## 2020-08-04 ENCOUNTER — Telehealth: Payer: Self-pay

## 2020-08-04 NOTE — Telephone Encounter (Signed)
Patient was called and a voicemail was left informing patient to return phone call for lab results.  Normal results letter will be mailed.

## 2020-08-04 NOTE — Telephone Encounter (Signed)
-----   Message from Charlott Rakes, MD sent at 08/02/2020  1:19 PM EST ----- Mammogram is negative for malignancy

## 2020-08-18 ENCOUNTER — Ambulatory Visit: Payer: PPO | Attending: Family Medicine | Admitting: Family Medicine

## 2020-08-18 ENCOUNTER — Encounter: Payer: Self-pay | Admitting: Family Medicine

## 2020-08-18 ENCOUNTER — Other Ambulatory Visit: Payer: Self-pay

## 2020-08-18 VITALS — BP 182/68 | HR 57 | Ht 62.0 in | Wt 95.2 lb

## 2020-08-18 DIAGNOSIS — Z1211 Encounter for screening for malignant neoplasm of colon: Secondary | ICD-10-CM

## 2020-08-18 DIAGNOSIS — E43 Unspecified severe protein-calorie malnutrition: Secondary | ICD-10-CM | POA: Diagnosis not present

## 2020-08-18 DIAGNOSIS — I152 Hypertension secondary to endocrine disorders: Secondary | ICD-10-CM | POA: Diagnosis not present

## 2020-08-18 DIAGNOSIS — E1169 Type 2 diabetes mellitus with other specified complication: Secondary | ICD-10-CM

## 2020-08-18 DIAGNOSIS — Z23 Encounter for immunization: Secondary | ICD-10-CM | POA: Diagnosis not present

## 2020-08-18 DIAGNOSIS — E1159 Type 2 diabetes mellitus with other circulatory complications: Secondary | ICD-10-CM | POA: Diagnosis not present

## 2020-08-18 LAB — POCT GLYCOSYLATED HEMOGLOBIN (HGB A1C): HbA1c, POC (controlled diabetic range): 6.1 % (ref 0.0–7.0)

## 2020-08-18 MED ORDER — LISINOPRIL-HYDROCHLOROTHIAZIDE 10-12.5 MG PO TABS: 1.0000 | ORAL_TABLET | Freq: Every day | ORAL | 1 refills | Status: DC

## 2020-08-18 NOTE — Progress Notes (Signed)
Subjective:  Patient ID: Lisa Taylor, female    DOB: 14-Jul-1950  Age: 70 y.o. MRN: [id number]  CC: Hypertension   HPI Lisa Taylor is a 70 year old female with a history of type 2 diabetes mellitus (diet controlled with A1c of6.1), hypertension, chronic left shoulder pain who presents today for a follow-up visit. She has gained 10 pounds since her last visit and endorses eating a lot.  At her last visit she had been commenced on Remeron due to poor appetite, insomnia after the loss of her husband.  She currently resides with her daughter and has her grandchildren and great-grandchildren for social support.  Her blood pressure is elevated and she states she took her antihypertensives but is unsure if she has run out of some medications. She denies presence of chest pain, dyspnea and has no additional concerns today.  Past Medical History:  Diagnosis Date  . Hypertension     No past surgical history on file.  No family history on file.  Allergies  Allergen Reactions  . Codeine Other (See Comments)    Nausea/vomitting; some dizziness    Outpatient Medications Prior to Visit  Medication Sig Dispense Refill  . aspirin (EQ ASPIRIN LOW DOSE) 81 MG chewable tablet CHEW AND SWALLOW ONE TABLET BY MOUTH ONCE DAILY 30 tablet 3  . atorvastatin (LIPITOR) 20 MG tablet Take 1 tablet (20 mg total) by mouth daily. 90 tablet 1  . Blood Glucose Monitoring Suppl (ONE TOUCH ULTRA 2) w/Device KIT Use to check blood sugar once daily. E11.69 1 kit 0  . Calcium Carbonate-Vitamin D 600-400 MG-UNIT tablet Take 1 tablet by mouth daily.    . carvedilol (COREG) 12.5 MG tablet Take 1 tablet (12.5 mg total) by mouth 2 (two) times daily with a meal. 180 tablet 1  . cetaphil (CETAPHIL) lotion Apply 1 application topically as needed for dry skin. 473 mL 0  . cyclobenzaprine (FLEXERIL) 10 MG tablet TAKE 1 TABLET BY MOUTH TWICE DAILY AS NEEDED FOR MUSCLE SPASM 60 tablet 2  . hydrocortisone 2.5 %  cream Apply topically 2 (two) times daily. Apply to legs 2 times a day. Dispense 30 grams 30 g 3  . memantine (NAMENDA) 5 MG tablet Take 1 tablet (5 mg total) by mouth 2 (two) times daily. 30 tablet 6  . mirtazapine (REMERON) 30 MG tablet Take 1 tablet (30 mg total) by mouth at bedtime. 90 tablet 1  . NIFEdipine (ADALAT CC) 90 MG 24 hr tablet Take 1 tablet (90 mg total) by mouth daily. 90 tablet 1  . OneTouch Delica Lancets 19E MISC Use to check blood sugar once daily. E11.69 100 each 2  . ONETOUCH ULTRA test strip USE AS DIRECTED ONCE DAILY BEFORE BREAKFAST. 50 each 5  . traMADol (ULTRAM) 50 MG tablet Take 1 tablet (50 mg total) by mouth every 12 (twelve) hours as needed. 50 tablet 1  . lisinopril (ZESTRIL) 5 MG tablet Take 1 tablet (5 mg total) by mouth daily. 90 tablet 1   No facility-administered medications prior to visit.     ROS Review of Systems  Constitutional: Negative for activity change, appetite change and fatigue.  HENT: Negative for congestion, sinus pressure and sore throat.   Eyes: Negative for visual disturbance.  Respiratory: Negative for cough, chest tightness, shortness of breath and wheezing.   Cardiovascular: Negative for chest pain and palpitations.  Gastrointestinal: Negative for abdominal distention, abdominal pain and constipation.  Endocrine: Negative for polydipsia.  Genitourinary:  Negative for dysuria and frequency.  Musculoskeletal:       Left shoulder stiffness  Skin: Negative for rash.  Neurological: Negative for tremors, light-headedness and numbness.  Hematological: Does not bruise/bleed easily.  Psychiatric/Behavioral: Negative for agitation and behavioral problems.    Objective:  BP (!) 182/68   Pulse (!) 57   Ht $R'5\' 2"'xx$  (1.575 m)   Wt 95 lb 3.2 oz (43.2 kg)   SpO2 100%   BMI 17.41 kg/m   BP/Weight 08/18/2020 16/12/3708 12/03/6946  Systolic BP 546 270 350  Diastolic BP 68 64 67  Wt. (Lbs) 95.2 84.4 92.4  BMI 17.41 15.44 16.9       Physical Exam Constitutional:      Appearance: She is well-developed.     Comments: Underweight  Neck:     Vascular: No JVD.  Cardiovascular:     Rate and Rhythm: Bradycardia present.     Heart sounds: Normal heart sounds. No murmur heard.   Pulmonary:     Effort: Pulmonary effort is normal.     Breath sounds: Normal breath sounds. No wheezing or rales.  Chest:     Chest wall: No tenderness.  Abdominal:     General: Bowel sounds are normal. There is no distension.     Palpations: Abdomen is soft. There is no mass.     Tenderness: There is no abdominal tenderness.  Musculoskeletal:     Right lower leg: No edema.     Left lower leg: No edema.     Comments: Abduction of left shoulder restricted to 60 degrees  Neurological:     Mental Status: She is alert and oriented to person, place, and time.  Psychiatric:        Mood and Affect: Mood normal.     Diabetic Foot Exam - Simple   Simple Foot Form Diabetic Foot exam was performed with the following findings: Yes 08/18/2020  4:28 PM  Visual Inspection No deformities, no ulcerations, no other skin breakdown bilaterally: Yes Sensation Testing Intact to touch and monofilament testing bilaterally: Yes Pulse Check Posterior Tibialis and Dorsalis pulse intact bilaterally: Yes Comments     CMP Latest Ref Rng & Units 05/18/2020 07/08/2019 05/20/2018  Glucose 65 - 99 mg/dL 162(H) 99 123(H)  BUN 8 - 27 mg/dL $Remove'16 11 15  'qLiXLod$ Creatinine 0.57 - 1.00 mg/dL 0.88 0.77 1.01(H)  Sodium 134 - 144 mmol/L 141 140 139  Potassium 3.5 - 5.2 mmol/L 4.5 3.6 4.4  Chloride 96 - 106 mmol/L 100 100 100  CO2 20 - 29 mmol/L $RemoveB'26 24 22  'cDIwEOTJ$ Calcium 8.7 - 10.3 mg/dL 10.5(H) 10.2 10.7(H)  Total Protein 6.0 - 8.5 g/dL 8.2 7.7 8.0  Total Bilirubin 0.0 - 1.2 mg/dL 0.4 0.6 0.5  Alkaline Phos 44 - 121 IU/L 67 77 101  AST 0 - 40 IU/L 33 46(H) 39  ALT 0 - 32 IU/L 32 40(H) 49(H)    Lipid Panel     Component Value Date/Time   CHOL 176 07/08/2019 1630   TRIG  59 07/08/2019 1630   HDL 81 07/08/2019 1630   CHOLHDL 2.2 07/08/2019 1630   CHOLHDL 2.2 08/01/2016 1047   VLDL 9 08/01/2016 1047   LDLCALC 83 07/08/2019 1630    CBC    Component Value Date/Time   WBC 6.1 01/25/2015 0945   RBC 5.73 (H) 01/25/2015 0945   HGB 17.7 (H) 01/25/2015 0945   HCT 50.6 (H) 01/25/2015 0945   PLT 254 01/25/2015 0945   MCV 88.3  01/25/2015 0945   MCH 30.9 01/25/2015 0945   MCHC 35.0 01/25/2015 0945   RDW 13.3 01/25/2015 0945   LYMPHSABS 2.5 01/25/2015 0945   MONOABS 0.4 01/25/2015 0945   EOSABS 0.1 01/25/2015 0945   BASOSABS 0.0 01/25/2015 0945    Lab Results  Component Value Date   HGBA1C 6.1 08/18/2020    Assessment & Plan:  1. Type 2 diabetes mellitus with other specified complication, without long-term current use of insulin (HCC) Diet controlled with A1c of 6.1 She is due for an eye exam daughter promises to take her to the [place] on Diabetic diet, my plate method, 875 minutes of moderate intensity exercise/week Blood sugar logs with fasting goals of 80-120 mg/dl, random of less than 180 and in the event of sugars less than 60 mg/dl or greater than 400 mg/dl encouraged to notify the clinic. Advised on the need for annual eye exams, annual foot exams, Pneumonia vaccine. - POCT glycosylated hemoglobin (Hb A1C)  2. Screening for colon cancer - Ambulatory referral to Gastroenterology  3. Hypertension complicating diabetes ([place]) Uncontrolled Lisinopril 5 mg substituted with lisinopril/HCTZ 10/12.5 We will check basic metabolic panel in 2 weeks Counseled on blood pressure goal of less than 130/80, low-sodium, DASH diet, medication compliance, 150 minutes of moderate intensity exercise per week. Discussed medication compliance, adverse effects. - lisinopril-hydrochlorothiazide (ZESTORETIC) 10-12.5 MG tablet; Take 1 tablet by mouth daily.  Dispense: 90 tablet; Refill: 1 - Basic Metabolic Panel; Future  4. Severe  protein-calorie malnutrition ([place]) She has gained 10 more pounds Continue Remeron  5. Need for immunization against influenza - Flu Vaccine QUAD 36+ mos IM   Meds ordered this encounter  Medications  . lisinopril-hydrochlorothiazide (ZESTORETIC) 10-12.5 MG tablet    Sig: Take 1 tablet by mouth daily.    Dispense:  90 tablet    Refill:  1    Discontinue lisinopril 5 mg    Follow-up: Return in about 3 months (around 11/15/2020) for Chronic medical condition.       Charlott Rakes, MD, FAAFP. [facility] and [place], [place]   08/18/2020, 5:40 PM

## 2020-08-18 NOTE — Patient Instructions (Signed)

## 2020-08-24 ENCOUNTER — Other Ambulatory Visit: Payer: Self-pay | Admitting: Family Medicine

## 2020-08-24 DIAGNOSIS — I10 Essential (primary) hypertension: Secondary | ICD-10-CM

## 2020-08-24 DIAGNOSIS — I152 Hypertension secondary to endocrine disorders: Secondary | ICD-10-CM

## 2020-08-24 DIAGNOSIS — E1159 Type 2 diabetes mellitus with other circulatory complications: Secondary | ICD-10-CM

## 2020-09-10 ENCOUNTER — Encounter: Payer: Self-pay | Admitting: Gastroenterology

## 2020-10-23 ENCOUNTER — Other Ambulatory Visit: Payer: Self-pay | Admitting: Family Medicine

## 2020-10-23 DIAGNOSIS — E1169 Type 2 diabetes mellitus with other specified complication: Secondary | ICD-10-CM

## 2020-10-23 DIAGNOSIS — E785 Hyperlipidemia, unspecified: Secondary | ICD-10-CM

## 2020-10-23 NOTE — Telephone Encounter (Signed)
Requested medication (s) are due for refill today: yes  Requested medication (s) are on the active medication list: yes  Last refill:  05/18/20   Future visit scheduled: yes  Notes to clinic:  overdue lab work   Requested Prescriptions  Pending Prescriptions Disp Refills   atorvastatin (LIPITOR) 20 MG tablet [Pharmacy Med Name: Atorvastatin Calcium 20 MG Oral Tablet] 90 tablet 0    Sig: Take 1 tablet by mouth once daily      Cardiovascular:  Antilipid - Statins Failed - 10/23/2020 11:54 AM      Failed - Total Cholesterol in normal range and within 360 days    Cholesterol, Total  Date Value Ref Range Status  07/08/2019 176 100 - 199 mg/dL Final          Failed - LDL in normal range and within 360 days    LDL Chol Calc (NIH)  Date Value Ref Range Status  07/08/2019 83 0 - 99 mg/dL Final          Failed - HDL in normal range and within 360 days    HDL  Date Value Ref Range Status  07/08/2019 81 >39 mg/dL Final          Failed - Triglycerides in normal range and within 360 days    Triglycerides  Date Value Ref Range Status  07/08/2019 59 0 - 149 mg/dL Final          Passed - Patient is not pregnant      Passed - Valid encounter within last 12 months    Recent Outpatient Visits           2 months ago Type 2 diabetes mellitus with other specified complication, without long-term current use of insulin (HCC)   [place], [place], MD   5 months ago Type 2 diabetes mellitus with other specified complication, without long-term current use of insulin ([place])   [place], [place], MD   1 year ago Type 2 diabetes mellitus without complication, without long-term current use of insulin ([place])   [place], [place], MD   2 years ago Type 2 diabetes mellitus without complication, without long-term current use of insulin ([place])   [place], [place], MD   2 years ago Type 2 diabetes mellitus without complication, without long-term current use of insulin ([place])   [place], [place], MD       Future Appointments             In 3 weeks Charlott Rakes, MD [place]

## 2020-10-28 ENCOUNTER — Telehealth: Payer: Self-pay | Admitting: Family Medicine

## 2020-10-28 NOTE — Telephone Encounter (Signed)
Provider on [facility] 5/17. Called Pt no answer left vm to call [phone] to reschedule appt.

## 2020-11-09 ENCOUNTER — Ambulatory Visit: Payer: PPO

## 2020-11-09 ENCOUNTER — Other Ambulatory Visit: Payer: Self-pay

## 2020-11-09 VITALS — Ht 62.0 in | Wt 95.0 lb

## 2020-11-09 NOTE — Progress Notes (Addendum)
  Did not complete pre visit. Patient did not have transportation, rescheduled in  July with PV 12/20/20

## 2020-11-16 ENCOUNTER — Ambulatory Visit: Payer: PPO | Admitting: Family Medicine

## 2020-11-20 ENCOUNTER — Other Ambulatory Visit: Payer: Self-pay | Admitting: Family Medicine

## 2020-11-20 DIAGNOSIS — E1169 Type 2 diabetes mellitus with other specified complication: Secondary | ICD-10-CM

## 2020-11-20 DIAGNOSIS — E1159 Type 2 diabetes mellitus with other circulatory complications: Secondary | ICD-10-CM

## 2020-11-20 NOTE — Telephone Encounter (Signed)
Requested medication (s) are due for refill today: carvedilol: yes      atorvastatin: no   Requested medication (s) are on the active medication list: yes  Last refill:  carvedilol: 05/18/20 #180 1 RF                 atorvastatin: 10/25/20 #90  Future visit scheduled: no  Notes to clinic:  overdue follow up and labs Called and advised pt that she is overdue appt. Sent pt MyChart sign up information. Pt stated that she will call back when she knows her daughters availability    Requested Prescriptions  Pending Prescriptions Disp Refills   carvedilol (COREG) 12.5 MG tablet [Pharmacy Med Name: Carvedilol 12.5 MG Oral Tablet] 180 tablet 0    Sig: TAKE 1 TABLET BY MOUTH TWICE DAILY WITH A MEAL      Cardiovascular:  Beta Blockers Failed - 11/20/2020  4:45 PM      Failed - Last BP in normal range    BP Readings from Last 1 Encounters:  08/18/20 (!) 182/68          Passed - Last Heart Rate in normal range    Pulse Readings from Last 1 Encounters:  08/18/20 (!) 58          Passed - Valid encounter within last 6 months    Recent Outpatient Visits           3 months ago Type 2 diabetes mellitus with other specified complication, without long-term current use of insulin ([place])   [place], [place] Stone, MD   6 months ago Type 2 diabetes mellitus with other specified complication, without long-term current use of insulin ([place])   [place], Finlayson, MD   1 year ago Type 2 diabetes mellitus without complication, without long-term current use of insulin ([place])   [place], Lockwood, MD   2 years ago Type 2 diabetes mellitus without complication, without long-term current use of insulin ([place])   [place], Charlane Ferretti, MD   2 years ago Type 2 diabetes mellitus without complication, without long-term current use of insulin ([place])   Startex, Rossie, MD                  atorvastatin (LIPITOR) 20 MG tablet [Pharmacy Med Name: Atorvastatin Calcium 20 MG Oral Tablet] 90 tablet 0    Sig: Take 1 tablet by mouth once daily      Cardiovascular:  Antilipid - Statins Failed - 11/20/2020  4:45 PM      Failed - Total Cholesterol in normal range and within 360 days    Cholesterol, Total  Date Value Ref Range Status  07/08/2019 176 100 - 199 mg/dL Final          Failed - LDL in normal range and within 360 days    LDL Chol Calc (NIH)  Date Value Ref Range Status  07/08/2019 83 0 - 99 mg/dL Final          Failed - HDL in normal range and within 360 days    HDL  Date Value Ref Range Status  07/08/2019 81 >39 mg/dL Final          Failed - Triglycerides in normal range and within 360 days    Triglycerides  Date Value Ref Range Status  07/08/2019 59 0 - 149 mg/dL  Final          Passed - Patient is not pregnant      Passed - Valid encounter within last 12 months    Recent Outpatient Visits           3 months ago Type 2 diabetes mellitus with other specified complication, without long-term current use of insulin ([place])   [place], Lowrys, MD   6 months ago Type 2 diabetes mellitus with other specified complication, without long-term current use of insulin ([place])   [place], Concord, MD   1 year ago Type 2 diabetes mellitus without complication, without long-term current use of insulin ([place])   [place], Highland Beach, MD   2 years ago Type 2 diabetes mellitus without complication, without long-term current use of insulin ([place])   [place], Brewerton, MD   2 years ago Type 2 diabetes mellitus without complication, without long-term current use of insulin ([place])   [facility] Charlott Rakes, MD

## 2020-11-23 ENCOUNTER — Encounter: Payer: PPO | Admitting: Gastroenterology

## 2020-12-15 ENCOUNTER — Other Ambulatory Visit: Payer: Self-pay | Admitting: Family Medicine

## 2020-12-15 DIAGNOSIS — R413 Other amnesia: Secondary | ICD-10-CM

## 2020-12-20 ENCOUNTER — Ambulatory Visit (AMBULATORY_SURGERY_CENTER): Payer: PPO | Admitting: *Deleted

## 2020-12-20 ENCOUNTER — Other Ambulatory Visit: Payer: Self-pay

## 2020-12-20 VITALS — Ht 61.0 in | Wt 90.8 lb

## 2020-12-20 DIAGNOSIS — Z1211 Encounter for screening for malignant neoplasm of colon: Secondary | ICD-10-CM

## 2020-12-20 MED ORDER — SUPREP BOWEL PREP KIT 17.5-3.13-1.6 GM/177ML PO SOLN: 1.0000 | Freq: Once | ORAL | 0 refills | Status: AC

## 2020-12-20 NOTE — Progress Notes (Signed)

## 2020-12-27 ENCOUNTER — Other Ambulatory Visit: Payer: Self-pay | Admitting: Family Medicine

## 2020-12-27 DIAGNOSIS — R413 Other amnesia: Secondary | ICD-10-CM

## 2020-12-30 ENCOUNTER — Other Ambulatory Visit: Payer: Self-pay | Admitting: Family Medicine

## 2020-12-30 DIAGNOSIS — R413 Other amnesia: Secondary | ICD-10-CM

## 2020-12-30 NOTE — Telephone Encounter (Signed)
Recently ordered on 12/27/20. This is a duplicate and not needed-refusing at this time.

## 2020-12-31 ENCOUNTER — Other Ambulatory Visit: Payer: Self-pay | Admitting: Family Medicine

## 2020-12-31 DIAGNOSIS — E1159 Type 2 diabetes mellitus with other circulatory complications: Secondary | ICD-10-CM

## 2020-12-31 DIAGNOSIS — R413 Other amnesia: Secondary | ICD-10-CM

## 2021-01-01 ENCOUNTER — Other Ambulatory Visit: Payer: Self-pay | Admitting: Family Medicine

## 2021-01-01 DIAGNOSIS — I152 Hypertension secondary to endocrine disorders: Secondary | ICD-10-CM

## 2021-01-01 DIAGNOSIS — R413 Other amnesia: Secondary | ICD-10-CM

## 2021-01-01 NOTE — Telephone Encounter (Signed)
[facility] requesting too soon-  Last RF 12/27/20 #30 Requested Prescriptions  Pending Prescriptions Disp Refills  . memantine (NAMENDA) 5 MG tablet [Pharmacy Med Name: MEMANTINE HCL 5MG    TAB] 30 tablet     Sig: Take 1 tablet by mouth twice daily     Neurology:  Alzheimer's Agents Passed - 01/01/2021 12:21 PM      Passed - Valid encounter within last 6 months    Recent Outpatient Visits          4 months ago Type 2 diabetes mellitus with other specified complication, without long-term current use of insulin ([place])   [place], [place], MD   7 months ago Type 2 diabetes mellitus with other specified complication, without long-term current use of insulin ([place])   [place], Cushing, MD   1 year ago Type 2 diabetes mellitus without complication, without long-term current use of insulin ([place])   [place], Carthage, MD   2 years ago Type 2 diabetes mellitus without complication, without long-term current use of insulin ([place])   [place], Newport, MD   2 years ago Type 2 diabetes mellitus without complication, without long-term current use of insulin ([place])   [place], Enobong, MD      Future Appointments            In 1 month Charlott Rakes, MD [place]           . NIFEdipine (ADALAT CC) 90 MG 24 hr tablet [Pharmacy Med Name: NIFEdipine ER 90 MG Oral Tablet Extended Release 24 Hour] 90 tablet 0    Sig: Take 1 tablet by mouth once daily     Cardiovascular:  Calcium Channel Blockers Failed - 01/01/2021 12:21 PM      Failed - Last BP in normal range    BP Readings from Last 1 Encounters:  08/18/20 (!) 182/68         Passed - Valid encounter within last 6 months    Recent Outpatient Visits          4 months ago Type 2 diabetes mellitus with other specified  complication, without long-term current use of insulin ([facility])   [place], Bexley, MD   7 months ago Type 2 diabetes mellitus with other specified complication, without long-term current use of insulin ([place])   [place], Deer Creek, MD   1 year ago Type 2 diabetes mellitus without complication, without long-term current use of insulin ([place])   [place], [place], MD   2 years ago Type 2 diabetes mellitus without complication, without long-term current use of insulin ([place])   [place], Cecilia, MD   2 years ago Type 2 diabetes mellitus without complication, without long-term current use of insulin ([place])   [place], Enobong, MD      Future Appointments            In 1 month Charlott Rakes, MD [place]

## 2021-01-02 ENCOUNTER — Other Ambulatory Visit: Payer: Self-pay | Admitting: Family Medicine

## 2021-01-02 DIAGNOSIS — R413 Other amnesia: Secondary | ICD-10-CM

## 2021-01-02 NOTE — Telephone Encounter (Signed)
Last RF 12/27/20 refilled with 30 tabs Taking 1 tablet twice daily for 15 days \\shoud  have enough until 01/10/21

## 2021-01-05 ENCOUNTER — Encounter: Payer: Self-pay | Admitting: Gastroenterology

## 2021-01-07 ENCOUNTER — Ambulatory Visit (AMBULATORY_SURGERY_CENTER): Payer: PPO | Admitting: Gastroenterology

## 2021-01-07 ENCOUNTER — Encounter: Payer: Self-pay | Admitting: Gastroenterology

## 2021-01-07 ENCOUNTER — Other Ambulatory Visit: Payer: Self-pay

## 2021-01-07 VITALS — BP 111/55 | HR 60 | Temp 98.2°F | Resp 11 | Ht 62.0 in | Wt 90.8 lb

## 2021-01-07 DIAGNOSIS — Z1211 Encounter for screening for malignant neoplasm of colon: Secondary | ICD-10-CM

## 2021-01-07 DIAGNOSIS — E119 Type 2 diabetes mellitus without complications: Secondary | ICD-10-CM | POA: Diagnosis not present

## 2021-01-07 DIAGNOSIS — I10 Essential (primary) hypertension: Secondary | ICD-10-CM | POA: Diagnosis not present

## 2021-01-07 MED ORDER — SODIUM CHLORIDE 0.9 % IV SOLN: 500.0000 mL | Freq: Once | INTRAVENOUS | Status: DC

## 2021-01-07 NOTE — Patient Instructions (Signed)
Impression/Recommendations:  Resume previous diet. Continue present medications. Repeat colonoscopy in 10 years for screening purposes.  YOU HAD AN ENDOSCOPIC PROCEDURE TODAY AT [facility]:   Refer to the procedure report that was given to you for any specific questions about what was found during the examination.  If the procedure report does not answer your questions, please call your gastroenterologist to clarify.  If you requested that your care partner not be given the details of your procedure findings, then the procedure report has been included in a sealed envelope for you to review at your convenience later.  YOU SHOULD EXPECT: Some feelings of bloating in the abdomen. Passage of more gas than usual.  Walking can help get rid of the air that was put into your GI tract during the procedure and reduce the bloating. If you had a lower endoscopy (such as a colonoscopy or flexible sigmoidoscopy) you may notice spotting of blood in your stool or on the toilet paper. If you underwent a bowel prep for your procedure, you may not have a normal bowel movement for a few days.  Please Note:  You might notice some irritation and congestion in your nose or some drainage.  This is from the oxygen used during your procedure.  There is no need for concern and it should clear up in a day or so.  SYMPTOMS TO REPORT IMMEDIATELY:  Following lower endoscopy (colonoscopy or flexible sigmoidoscopy):  Excessive amounts of blood in the stool  Significant tenderness or worsening of abdominal pains  Swelling of the abdomen that is new, acute  Fever of 100F or higher For urgent or emergent issues, a gastroenterologist can be reached at any hour by calling [phone]. Do not use MyChart messaging for urgent concerns.    DIET:  We do recommend a small meal at first, but then you may proceed to your regular diet.  Drink plenty of fluids but you should avoid alcoholic beverages for 24  hours.  ACTIVITY:  You should plan to take it easy for the rest of today and you should NOT DRIVE or use heavy machinery until tomorrow (because of the sedation medicines used during the test).    FOLLOW UP: Our staff will call the number listed on your records 48-72 hours following your procedure to check on you and address any questions or concerns that you may have regarding the information given to you following your procedure. If we do not reach you, we will leave a message.  We will attempt to reach you two times.  During this call, we will ask if you have developed any symptoms of COVID 19. If you develop any symptoms (ie: fever, flu-like symptoms, shortness of breath, cough etc.) before then, please call [phone].  If you test positive for Covid 19 in the 2 weeks post procedure, please call and report this information to [place].    If any biopsies were taken you will be contacted by phone or by letter within the next 1-3 weeks.  Please call us at [phone] if you have not heard about the biopsies in 3 weeks.    SIGNATURES/CONFIDENTIALITY: You and/or your care partner have signed paperwork which will be entered into your electronic medical record.  These signatures attest to the fact that that the information above on your After Visit Summary has been reviewed and is understood.  Full responsibility of the confidentiality of this discharge information lies with you and/or your care-partner.

## 2021-01-07 NOTE — Progress Notes (Signed)
Vitals by [place]. Pt's states no medical or surgical changes since previsit or office visit.

## 2021-01-07 NOTE — Progress Notes (Signed)
Report to PACU, RN, vss, BBS= Clear.  

## 2021-01-07 NOTE — Op Note (Signed)
[facility] Patient Name: Lisa Taylor Procedure Date: 01/07/2021 2:00 PM MRN: [id number] Endoscopist: Milus Banister , MD Age: 70 Referring MD:  Date of Birth: 08-Oct-1950 Gender: Female Account #: [id number] Procedure:                Colonoscopy Indications:              Screening for colorectal malignant neoplasm Medicines:                Monitored Anesthesia Care Procedure:                Pre-Anesthesia Assessment:                           - Prior to the procedure, a History and Physical                            was performed, and patient medications and                            allergies were reviewed. The patient's tolerance of                            previous anesthesia was also reviewed. The risks                            and benefits of the procedure and the sedation                            options and risks were discussed with the patient.                            All questions were answered, and informed consent                            was obtained. Prior Anticoagulants: The patient has                            taken no previous anticoagulant or antiplatelet                            agents. ASA Grade Assessment: II - A patient with                            mild systemic disease. After reviewing the risks                            and benefits, the patient was deemed in                            satisfactory condition to undergo the procedure.                           After obtaining informed consent, the colonoscope  was passed under direct vision. Throughout the                            procedure, the patient's blood pressure, pulse, and                            oxygen saturations were monitored continuously. The                            [id number] was introduced through the anus and                            advanced to the the cecum, identified by                            appendiceal orifice and  ileocecal valve. The                            colonoscopy was performed without difficulty. The                            patient tolerated the procedure well. The quality                            of the bowel preparation was good. The ileocecal                            valve, appendiceal orifice, and rectum were                            photographed. Scope In: 2:21:33 PM Scope Out: 2:31:31 PM Scope Withdrawal Time: 0 hours 7 minutes 17 seconds  Total Procedure Duration: 0 hours 9 minutes 58 seconds  Findings:                 The entire examined colon was normal.                           Small rectal vault precluded retroflex view of the                            anus. Complications:            No immediate complications. Estimated blood loss:                            None. Estimated Blood Loss:     Estimated blood loss: none. Impression:               - Normal examination.                           - No polyps or cancers. Recommendation:           - Patient has a contact number available for  emergencies. The signs and symptoms of potential                            delayed complications were discussed with the                            patient. Return to normal activities tomorrow.                            Written discharge instructions were provided to the                            patient.                           - Resume previous diet.                           - Continue present medications.                           - Repeat colonoscopy in 10 years for screening                            purposes. Milus Banister, MD 01/07/2021 2:36:21 PM This report has been signed electronically.

## 2021-01-10 ENCOUNTER — Other Ambulatory Visit: Payer: Self-pay | Admitting: Family Medicine

## 2021-01-10 DIAGNOSIS — R413 Other amnesia: Secondary | ICD-10-CM

## 2021-01-11 ENCOUNTER — Telehealth: Payer: Self-pay

## 2021-01-11 NOTE — Telephone Encounter (Signed)
  Follow up Call-  Call back number 01/07/2021  Post procedure Call Back phone  # [phone] (daughter's number)  Permission to leave phone message Yes  Some recent data might be hidden     Patient questions:  Do you have a fever, pain , or abdominal swelling? No. Pain Score  0 *  Have you tolerated food without any problems? Yes.    Have you been able to return to your normal activities? Yes.    Do you have any questions about your discharge instructions: Diet   No. Medications  No. Follow up visit  No.  Do you have questions or concerns about your Care? No.  Actions: * If pain score is 4 or above: No action needed, pain <4.

## 2021-01-12 DIAGNOSIS — H2513 Age-related nuclear cataract, bilateral: Secondary | ICD-10-CM | POA: Diagnosis not present

## 2021-01-12 DIAGNOSIS — H40033 Anatomical narrow angle, bilateral: Secondary | ICD-10-CM | POA: Diagnosis not present

## 2021-01-21 ENCOUNTER — Other Ambulatory Visit: Payer: Self-pay | Admitting: Family Medicine

## 2021-01-21 DIAGNOSIS — E1159 Type 2 diabetes mellitus with other circulatory complications: Secondary | ICD-10-CM

## 2021-01-25 ENCOUNTER — Other Ambulatory Visit: Payer: Self-pay | Admitting: Family Medicine

## 2021-01-25 DIAGNOSIS — E1159 Type 2 diabetes mellitus with other circulatory complications: Secondary | ICD-10-CM

## 2021-01-25 NOTE — Telephone Encounter (Signed)
   Notes to clinic:  patient has appt on 02/14/2021 Review for refill Patient should have enough until appt    Requested Prescriptions  Pending Prescriptions Disp Refills   lisinopril-hydrochlorothiazide (ZESTORETIC) 10-12.5 MG tablet [Pharmacy Med Name: Lisinopril-hydroCHLOROthiazide 10-12.5 MG Oral Tablet] 90 tablet 0    Sig: Take 1 tablet by mouth once daily      Cardiovascular:  ACEI + Diuretic Combos Failed - 01/25/2021 10:28 AM      Failed - Na in normal range and within 180 days    Sodium  Date Value Ref Range Status  05/18/2020 141 134 - 144 mmol/L Final          Failed - K in normal range and within 180 days    Potassium  Date Value Ref Range Status  05/18/2020 4.5 3.5 - 5.2 mmol/L Final          Failed - Cr in normal range and within 180 days    Creat  Date Value Ref Range Status  08/01/2016 0.93 0.50 - 0.99 mg/dL Final    Comment:      For patients > or = 71 years of age: The upper reference limit for Creatinine is approximately 13% higher for people identified as African-American.      Creatinine, Ser  Date Value Ref Range Status  05/18/2020 0.88 0.57 - 1.00 mg/dL Final          Failed - Ca in normal range and within 180 days    Calcium  Date Value Ref Range Status  05/18/2020 10.5 (H) 8.7 - 10.3 mg/dL Final   Calcium, Total (PTH)  Date Value Ref Range Status  06/10/2010 10.7 (H) 8.4 - 10.5 mg/dL Final          Passed - Patient is not pregnant      Passed - Last BP in normal range    BP Readings from Last 1 Encounters:  01/07/21 (!) 111/55          Passed - Valid encounter within last 6 months    Recent Outpatient Visits           5 months ago Type 2 diabetes mellitus with other specified complication, without long-term current use of insulin ([place])   [place], Lakeland, MD   8 months ago Type 2 diabetes mellitus with other specified complication, without long-term current use of insulin ([place])    [place], Harmony, MD   1 year ago Type 2 diabetes mellitus without complication, without long-term current use of insulin ([place])   [place], Boswell, MD   2 years ago Type 2 diabetes mellitus without complication, without long-term current use of insulin ([place])   [place], [place], MD   2 years ago Type 2 diabetes mellitus without complication, without long-term current use of insulin ([place])   [place], Enobong, MD       Future Appointments             In 2 weeks  Post-COVID Care Clinic at [facility]   In 3 weeks Charlott Rakes, MD [place]

## 2021-02-07 ENCOUNTER — Other Ambulatory Visit: Payer: Self-pay | Admitting: Family Medicine

## 2021-02-07 DIAGNOSIS — R413 Other amnesia: Secondary | ICD-10-CM

## 2021-02-07 NOTE — Telephone Encounter (Signed)
PCP refilled today

## 2021-02-14 ENCOUNTER — Ambulatory Visit: Payer: PPO

## 2021-02-16 DIAGNOSIS — H40013 Open angle with borderline findings, low risk, bilateral: Secondary | ICD-10-CM | POA: Diagnosis not present

## 2021-02-16 DIAGNOSIS — H25811 Combined forms of age-related cataract, right eye: Secondary | ICD-10-CM | POA: Diagnosis not present

## 2021-02-21 ENCOUNTER — Ambulatory Visit: Payer: PPO | Admitting: Family Medicine

## 2021-02-23 ENCOUNTER — Other Ambulatory Visit: Payer: Self-pay | Admitting: Family Medicine

## 2021-02-23 DIAGNOSIS — E1159 Type 2 diabetes mellitus with other circulatory complications: Secondary | ICD-10-CM

## 2021-02-23 DIAGNOSIS — I152 Hypertension secondary to endocrine disorders: Secondary | ICD-10-CM

## 2021-02-23 DIAGNOSIS — R413 Other amnesia: Secondary | ICD-10-CM

## 2021-02-23 MED ORDER — LISINOPRIL-HYDROCHLOROTHIAZIDE 10-12.5 MG PO TABS: 1.0000 | ORAL_TABLET | Freq: Every day | ORAL | 0 refills | Status: DC

## 2021-02-23 NOTE — Telephone Encounter (Signed)
Requested medication (s) are due for refill today: yes  Requested medication (s) are on the active medication list:  yes   Last refill:  11/20/2020  Future visit scheduled:  yes   Notes to clinic:  Failed protocol: Na in normal range and within 180 days   K in normal range and within 180 days   Cr in normal range and within 180 days   Ca in normal range and within 180 days   Valid encounter within last 6 months  Patient has appt on 05/23/2021 Review for enough medication until appt    Requested Prescriptions  Pending Prescriptions Disp Refills   lisinopril-hydrochlorothiazide (ZESTORETIC) 10-12.5 MG tablet [Pharmacy Med Name: Lisinopril-hydroCHLOROthiazide 10-12.5 MG Oral Tablet] 90 tablet 0    Sig: Take 1 tablet by mouth once daily     Cardiovascular:  ACEI + Diuretic Combos Failed - 02/23/2021 11:46 AM      Failed - Na in normal range and within 180 days    Sodium  Date Value Ref Range Status  05/18/2020 141 134 - 144 mmol/L Final          Failed - K in normal range and within 180 days    Potassium  Date Value Ref Range Status  05/18/2020 4.5 3.5 - 5.2 mmol/L Final          Failed - Cr in normal range and within 180 days    Creat  Date Value Ref Range Status  08/01/2016 0.93 0.50 - 0.99 mg/dL Final    Comment:      For patients > or = 71 years of age: The upper reference limit for Creatinine is approximately 13% higher for people identified as African-American.      Creatinine, Ser  Date Value Ref Range Status  05/18/2020 0.88 0.57 - 1.00 mg/dL Final          Failed - Ca in normal range and within 180 days    Calcium  Date Value Ref Range Status  05/18/2020 10.5 (H) 8.7 - 10.3 mg/dL Final   Calcium, Total (PTH)  Date Value Ref Range Status  06/10/2010 10.7 (H) 8.4 - 10.5 mg/dL Final          Failed - Valid encounter within last 6 months    Recent Outpatient Visits           6 months ago Type 2 diabetes mellitus with other specified complication,  without long-term current use of insulin ([employer])   [place], Dyckes[place], MD   9 months ago Type 2 diabetes mellitus with other specified complication, without long-term current use of insulin ([place])   [place], [place], MD   1 year ago Type 2 diabetes mellitus without complication, without long-term current use of insulin ([place])   [place]ford, [place], MD   2 years ago Type 2 diabetes mellitus without complication, without long-term current use of insulin ([place])   [place], [place], MD   2 years ago Type 2 diabetes mellitus without complication, without long-term current use of insulin ([place])   [place], Enobong, MD       Future Appointments             In 2 months Charlott Rakes, MD [place] - Patient is  not pregnant      Passed - Last BP in normal range    BP Readings from Last 1 Encounters:  01/07/21 (!) 111/55           memantine (NAMENDA) 5 MG tablet [Pharmacy Med Name: Memantine HCl 5 MG Oral Tablet] 30 tablet 0    Sig: Take 1 tablet by mouth twice daily     Neurology:  Alzheimer's Agents Failed - 02/23/2021 11:46 AM      Failed - Valid encounter within last 6 months    Recent Outpatient Visits           6 months ago Type 2 diabetes mellitus with other specified complication, without long-term current use of insulin ([place])   [place], [place]asso Beach, MD   9 months ago Type 2 diabetes mellitus with other specified complication, without long-term current use of insulin ([place])   [place], Foreman, MD   1 year ago Type 2 diabetes mellitus without complication, without long-term current use of insulin ([place])   [place], [place] Run,  MD   2 years ago Type 2 diabetes mellitus without complication, without long-term current use of insulin ([place])   [place], [place] Rosa, MD   2 years ago Type 2 diabetes mellitus without complication, without long-term current use of insulin ([place])   [place], Enobong, MD       Future Appointments             In 2 months Charlott Rakes, MD [place]

## 2021-03-01 ENCOUNTER — Other Ambulatory Visit: Payer: Self-pay | Admitting: Family Medicine

## 2021-03-01 DIAGNOSIS — E1159 Type 2 diabetes mellitus with other circulatory complications: Secondary | ICD-10-CM

## 2021-03-01 NOTE — Telephone Encounter (Signed)
Requested Prescriptions  Pending Prescriptions Disp Refills  . carvedilol (COREG) 12.5 MG tablet [Pharmacy Med Name: Carvedilol 12.5 MG Oral Tablet] 180 tablet 0    Sig: TAKE 1 TABLET BY MOUTH TWICE DAILY WITH A MEAL     Cardiovascular:  Beta Blockers Failed - 03/01/2021  1:21 PM      Failed - Valid encounter within last 6 months    Recent Outpatient Visits          6 months ago Type 2 diabetes mellitus with other specified complication, without long-term current use of insulin ([place])   [place], Crosby, MD   9 months ago Type 2 diabetes mellitus with other specified complication, without long-term current use of insulin ([place])   [place]ur[place], [place], MD   1 year ago Type 2 diabetes mellitus without complication, without long-term current use of insulin ([place])   [place], Collyer, MD   2 years ago Type 2 diabetes mellitus without complication, without long-term current use of insulin ([place])   [place], Paradise, MD   2 years ago Type 2 diabetes mellitus without complication, without long-term current use of insulin ([place])   [place] Charlott Rakes, MD      Future Appointments            In 2 months Charlott Rakes, MD [place] - Last BP in normal range    BP Readings from Last 1 Encounters:  01/07/21 (!) 111/55         Passed - Last Heart Rate in normal range    Pulse Readings from Last 1 Encounters:  01/07/21 60

## 2021-03-02 ENCOUNTER — Other Ambulatory Visit: Payer: Self-pay | Admitting: Family Medicine

## 2021-03-02 DIAGNOSIS — E1159 Type 2 diabetes mellitus with other circulatory complications: Secondary | ICD-10-CM

## 2021-03-02 DIAGNOSIS — I152 Hypertension secondary to endocrine disorders: Secondary | ICD-10-CM

## 2021-03-04 DIAGNOSIS — H25811 Combined forms of age-related cataract, right eye: Secondary | ICD-10-CM | POA: Diagnosis not present

## 2021-03-11 ENCOUNTER — Other Ambulatory Visit: Payer: Self-pay | Admitting: Family Medicine

## 2021-03-11 DIAGNOSIS — E785 Hyperlipidemia, unspecified: Secondary | ICD-10-CM

## 2021-03-11 DIAGNOSIS — R413 Other amnesia: Secondary | ICD-10-CM

## 2021-03-11 DIAGNOSIS — E1159 Type 2 diabetes mellitus with other circulatory complications: Secondary | ICD-10-CM

## 2021-03-11 DIAGNOSIS — E1169 Type 2 diabetes mellitus with other specified complication: Secondary | ICD-10-CM

## 2021-03-11 NOTE — Telephone Encounter (Signed)
Requested medication (s) are due for refill today: Yes  Requested medication (s) are on the active medication list: Yes  Last refill:  11/20/20  Future visit scheduled: Yes  Notes to clinic:  Unable to refill per protocol due to failed labs, no updated results for anti-lipid labs     Requested Prescriptions  Pending Prescriptions Disp Refills   atorvastatin (LIPITOR) 20 MG tablet [Pharmacy Med Name: Atorvastatin Calcium 20 MG Oral Tablet] 90 tablet 0    Sig: Take 1 tablet by mouth once daily     Cardiovascular:  Antilipid - Statins Failed - 03/11/2021 12:52 PM      Failed - Total Cholesterol in normal range and within 360 days    Cholesterol, Total  Date Value Ref Range Status  07/08/2019 176 100 - 199 mg/dL Final          Failed - LDL in normal range and within 360 days    LDL Chol Calc (NIH)  Date Value Ref Range Status  07/08/2019 83 0 - 99 mg/dL Final          Failed - HDL in normal range and within 360 days    HDL  Date Value Ref Range Status  07/08/2019 81 >39 mg/dL Final          Failed - Triglycerides in normal range and within 360 days    Triglycerides  Date Value Ref Range Status  07/08/2019 59 0 - 149 mg/dL Final          Passed - Patient is not pregnant      Passed - Valid encounter within last 12 months    Recent Outpatient Visits           6 months ago Type 2 diabetes mellitus with other specified complication, without long-term current use of insulin ([place])   [place], [place], MD   9 months ago Type 2 diabetes mellitus with other specified complication, without long-term current use of insulin ([place])   [place], [place], MD   1 year ago Type 2 diabetes mellitus without complication, without long-term current use of insulin ([place])   [place], [place], MD   2 years ago Type 2 diabetes mellitus without complication, without  long-term current use of insulin ([place])   [place], Moffett, MD   2 years ago Type 2 diabetes mellitus without complication, without long-term current use of insulin ([place])   [place], Polk, MD       Future Appointments             In 2 months Charlott Rakes, MD [place]            Signed Prescriptions Disp Refills   NIFEdipine (ADALAT CC) 90 MG 24 hr tablet 90 tablet 0    Sig: Take 1 tablet by mouth once daily     Cardiovascular:  Calcium Channel Blockers Failed - 03/11/2021 12:52 PM      Failed - Valid encounter within last 6 months    Recent Outpatient Visits           6 months ago Type 2 diabetes mellitus with other specified complication, without long-term current use of insulin ([place])   [place], [place], MD   9 months ago Type 2 diabetes mellitus with other specified complication,  without long-term current use of insulin ([place])   [place], [place], MD   1 year ago Type 2 diabetes mellitus without complication, without long-term current use of insulin ([place])   [place], [place], MD   2 years ago Type 2 diabetes mellitus without complication, without long-term current use of insulin ([place])   [place], Charlane Ferretti, MD   2 years ago Type 2 diabetes mellitus without complication, without long-term current use of insulin ([place])   [place], Charlane Ferretti, MD       Future Appointments             In 2 months Charlott Rakes, MD [place] - Last BP in normal range    BP Readings from Last 1 Encounters:  01/07/21 (!) 111/55           memantine (NAMENDA) 5 MG tablet 180 tablet 0    Sig: Take 1 tablet by mouth twice daily      Neurology:  Alzheimer's Agents Failed - 03/11/2021 12:52 PM      Failed - Valid encounter within last 6 months    Recent Outpatient Visits           6 months ago Type 2 diabetes mellitus with other specified complication, without long-term current use of insulin ([place])   [place], Helen, MD   9 months ago Type 2 diabetes mellitus with other specified complication, without long-term current use of insulin ([place])   [place], Weed, MD   1 year ago Type 2 diabetes mellitus without complication, without long-term current use of insulin ([place])   [place], Amity, MD   2 years ago Type 2 diabetes mellitus without complication, without long-term current use of insulin ([place])   [place], Charlane Ferretti, MD   2 years ago Type 2 diabetes mellitus without complication, without long-term current use of insulin ([place])   [place], Enobong, MD       Future Appointments             In 2 months Charlott Rakes, MD [place]

## 2021-03-23 ENCOUNTER — Other Ambulatory Visit: Payer: Self-pay | Admitting: Family Medicine

## 2021-03-23 DIAGNOSIS — R413 Other amnesia: Secondary | ICD-10-CM

## 2021-03-23 NOTE — Telephone Encounter (Signed)
Call to pharmacy- closed for lunch. Call to patient- left message Rx should be at pharmacy- 03/11/21 #180

## 2021-03-25 ENCOUNTER — Other Ambulatory Visit: Payer: Self-pay | Admitting: Family Medicine

## 2021-03-25 DIAGNOSIS — R413 Other amnesia: Secondary | ICD-10-CM

## 2021-03-25 NOTE — Telephone Encounter (Signed)
[place] and report given by Nicole Kindred, medication is being processed at this time.

## 2021-05-07 ENCOUNTER — Other Ambulatory Visit: Payer: Self-pay | Admitting: Family Medicine

## 2021-05-07 DIAGNOSIS — R413 Other amnesia: Secondary | ICD-10-CM

## 2021-05-07 DIAGNOSIS — F5102 Adjustment insomnia: Secondary | ICD-10-CM

## 2021-05-07 NOTE — Telephone Encounter (Signed)
Requested Prescriptions  Pending Prescriptions Disp Refills  . mirtazapine (REMERON) 30 MG tablet [Pharmacy Med Name: Mirtazapine 30 MG Oral Tablet] 90 tablet     Sig: TAKE 1 TABLET BY MOUTH AT BEDTIME     Psychiatry: Antidepressants - mirtazapine Failed - 05/07/2021 11:34 AM      Failed - Triglycerides in normal range and within 360 days    Triglycerides  Date Value Ref Range Status  07/08/2019 59 0 - 149 mg/dL Final         Failed - Total Cholesterol in normal range and within 360 days    Cholesterol, Total  Date Value Ref Range Status  07/08/2019 176 100 - 199 mg/dL Final         Failed - WBC in normal range and within 360 days    WBC  Date Value Ref Range Status  01/25/2015 6.1 4.0 - 10.5 K/uL Final         Failed - Valid encounter within last 6 months    Recent Outpatient Visits          8 months ago Type 2 diabetes mellitus with other specified complication, without long-term current use of insulin ([place])   [place], Charlane Ferretti, MD   11 months ago Type 2 diabetes mellitus with other specified complication, without long-term current use of insulin ([place])   [place], [place], MD   1 year ago Type 2 diabetes mellitus without complication, without long-term current use of insulin ([place])   [place], Charlane Ferretti, MD   2 years ago Type 2 diabetes mellitus without complication, without long-term current use of insulin ([place])   [place], Charlane Ferretti, MD   2 years ago Type 2 diabetes mellitus without complication, without long-term current use of insulin ([place])   [place], Charlane Ferretti, MD      Future Appointments            In 1 week Charlott Rakes, MD [place] - AST in normal range and within 360 days    AST  Date Value Ref Range Status   05/18/2020 33 0 - 40 IU/L Final         Passed - ALT in normal range and within 360 days    ALT  Date Value Ref Range Status  05/18/2020 32 0 - 32 IU/L Final         . memantine (NAMENDA) 5 MG tablet [Pharmacy Med Name: Memantine HCl 5 MG Oral Tablet] 90 tablet 0    Sig: Take 1 tablet by mouth twice daily     Neurology:  Alzheimer's Agents Failed - 05/07/2021 11:34 AM      Failed - Valid encounter within last 6 months    Recent Outpatient Visits          8 months ago Type 2 diabetes mellitus with other specified complication, without long-term current use of insulin ([place])   [place], [place], MD   11 months ago Type 2 diabetes mellitus with other specified complication, without long-term current use of insulin ([place])   [place], [place], MD   1 year ago Type 2 diabetes mellitus without complication, without long-term current use of insulin ([place])   [place], Charlane Ferretti, MD   2 years ago Type 2 diabetes mellitus without complication, without long-term current use of insulin ([place])   [place], [place], MD   2 years ago Type 2 diabetes mellitus without complication, without long-term current use of insulin ([place])   [place], Enobong, MD      Future Appointments            In 1 week Charlott Rakes, MD [place]

## 2021-05-07 NOTE — Telephone Encounter (Signed)
Requested medication (s) are due for refill today: yes  Requested medication (s) are on the active medication list: yes  Last refill:  05/18/20 #90 1 RF  Future visit scheduled: yes  Notes to clinic:  overdue lab work   Requested Prescriptions  Pending Prescriptions Disp Refills   mirtazapine (REMERON) 30 MG tablet [Pharmacy Med Name: Mirtazapine 30 MG Oral Tablet] 90 tablet     Sig: TAKE 1 TABLET BY MOUTH AT BEDTIME     Psychiatry: Antidepressants - mirtazapine Failed - 05/07/2021 11:34 AM      Failed - Triglycerides in normal range and within 360 days    Triglycerides  Date Value Ref Range Status  07/08/2019 59 0 - 149 mg/dL Final          Failed - Total Cholesterol in normal range and within 360 days    Cholesterol, Total  Date Value Ref Range Status  07/08/2019 176 100 - 199 mg/dL Final          Failed - WBC in normal range and within 360 days    WBC  Date Value Ref Range Status  01/25/2015 6.1 4.0 - 10.5 K/uL Final          Failed - Valid encounter within last 6 months    Recent Outpatient Visits           8 months ago Type 2 diabetes mellitus with other specified complication, without long-term current use of insulin ([place])   [place], Charlane Ferretti, MD   11 months ago Type 2 diabetes mellitus with other specified complication, without long-term current use of insulin ([place])   [place], Gilman, MD   1 year ago Type 2 diabetes mellitus without complication, without long-term current use of insulin ([place])   [place], Charlane Ferretti, MD   2 years ago Type 2 diabetes mellitus without complication, without long-term current use of insulin ([place])   [place], Charlane Ferretti, MD   2 years ago Type 2 diabetes mellitus without complication, without long-term current use of insulin ([place])   [place], Charlane Ferretti, MD       Future Appointments             In 1 week Charlott Rakes, MD [place] - AST in normal range and within 360 days    AST  Date Value Ref Range Status  05/18/2020 33 0 - 40 IU/L Final          Passed - ALT in normal range and within 360 days    ALT  Date Value Ref Range Status  05/18/2020 32 0 - 32 IU/L Final          Signed Prescriptions Disp Refills   memantine (NAMENDA) 5 MG tablet 90 tablet 0    Sig: Take 1 tablet by mouth twice daily     Neurology:  Alzheimer's Agents Failed - 05/07/2021 11:34 AM      Failed - Valid encounter within last 6 months    Recent Outpatient Visits           8 months ago Type 2 diabetes mellitus with other specified complication, without long-term current use of insulin ([place])   [place], Enobong, MD   11 months ago  Type 2 diabetes mellitus with other specified complication, without long-term current use of insulin ([place])   [place], [place], MD   1 year ago Type 2 diabetes mellitus without complication, without long-term current use of insulin ([place])   [place], Charlane Ferretti, MD   2 years ago Type 2 diabetes mellitus without complication, without long-term current use of insulin ([place])   [place], Charlane Ferretti, MD   2 years ago Type 2 diabetes mellitus without complication, without long-term current use of insulin ([place])   [place], MD       Future Appointments             In 1 week Charlott Rakes, MD [place]

## 2021-05-12 DIAGNOSIS — H2512 Age-related nuclear cataract, left eye: Secondary | ICD-10-CM | POA: Diagnosis not present

## 2021-05-13 DIAGNOSIS — H25812 Combined forms of age-related cataract, left eye: Secondary | ICD-10-CM | POA: Diagnosis not present

## 2021-05-17 ENCOUNTER — Other Ambulatory Visit: Payer: Self-pay

## 2021-05-17 ENCOUNTER — Encounter: Payer: Self-pay | Admitting: Family Medicine

## 2021-05-17 ENCOUNTER — Ambulatory Visit: Payer: PPO | Attending: Family Medicine | Admitting: Family Medicine

## 2021-05-17 VITALS — BP 140/70 | HR 82 | Ht 60.0 in | Wt 90.4 lb

## 2021-05-17 DIAGNOSIS — E1169 Type 2 diabetes mellitus with other specified complication: Secondary | ICD-10-CM | POA: Diagnosis not present

## 2021-05-17 DIAGNOSIS — E1159 Type 2 diabetes mellitus with other circulatory complications: Secondary | ICD-10-CM | POA: Diagnosis not present

## 2021-05-17 DIAGNOSIS — E785 Hyperlipidemia, unspecified: Secondary | ICD-10-CM | POA: Diagnosis not present

## 2021-05-17 DIAGNOSIS — I152 Hypertension secondary to endocrine disorders: Secondary | ICD-10-CM | POA: Diagnosis not present

## 2021-05-17 DIAGNOSIS — Z23 Encounter for immunization: Secondary | ICD-10-CM | POA: Diagnosis not present

## 2021-05-17 DIAGNOSIS — M7502 Adhesive capsulitis of left shoulder: Secondary | ICD-10-CM

## 2021-05-17 DIAGNOSIS — R413 Other amnesia: Secondary | ICD-10-CM | POA: Diagnosis not present

## 2021-05-17 DIAGNOSIS — F5102 Adjustment insomnia: Secondary | ICD-10-CM | POA: Diagnosis not present

## 2021-05-17 LAB — POCT GLYCOSYLATED HEMOGLOBIN (HGB A1C): HbA1c, POC (controlled diabetic range): 6.2 % (ref 0.0–7.0)

## 2021-05-17 MED ORDER — MIRTAZAPINE 30 MG PO TABS: 30.0000 mg | ORAL_TABLET | Freq: Every day | ORAL | 1 refills | Status: AC

## 2021-05-17 MED ORDER — CARVEDILOL 12.5 MG PO TABS: 12.5000 mg | ORAL_TABLET | Freq: Two times a day (BID) | ORAL | 1 refills | Status: AC

## 2021-05-17 MED ORDER — LISINOPRIL-HYDROCHLOROTHIAZIDE 10-12.5 MG PO TABS: 1.0000 | ORAL_TABLET | Freq: Every day | ORAL | 1 refills | Status: AC

## 2021-05-17 MED ORDER — MEMANTINE HCL 5 MG PO TABS: 5.0000 mg | ORAL_TABLET | Freq: Two times a day (BID) | ORAL | 1 refills | Status: AC

## 2021-05-17 MED ORDER — NIFEDIPINE ER 90 MG PO TB24: 90.0000 mg | ORAL_TABLET | Freq: Every day | ORAL | 1 refills | Status: AC

## 2021-05-17 MED ORDER — ATORVASTATIN CALCIUM 20 MG PO TABS: 20.0000 mg | ORAL_TABLET | Freq: Every day | ORAL | 1 refills | Status: AC

## 2021-05-17 NOTE — Patient Instructions (Signed)
Adhesive Capsulitis Adhesive capsulitis, also called frozen shoulder, causes the shoulder to become stiff and painful to move. This condition happens when there is inflammation of the tendons and ligaments that surround the shoulder joint (shoulder capsule). What are the causes? This condition may be caused by: An injury to your shoulder joint. Straining your shoulder. Not moving your shoulder for a period of time. This can happen if your arm was injured or in a sling. Long-standing conditions, such as: Diabetes. Thyroid problems. Heart disease. Stroke. Rheumatoid arthritis. Lung disease. In some cases, the cause is not known. What increases the risk? You are more likely to develop this condition if you are: A woman. Older than 71 years of age. What are the signs or symptoms? Symptoms of this condition include: Pain in your shoulder when you move your arm. There may also be pain when parts of your shoulder are touched. The pain may be worse at night or when you are resting. A sore or aching shoulder. The inability to move your shoulder normally. Muscle spasms. How is this diagnosed? This condition is diagnosed with a physical exam and imaging tests, such as an X-ray or MRI. How is this treated? This condition may be treated with: Treatment of the underlying cause or condition. Medicine. Medicine may be given to relieve pain, inflammation, or muscle spasms. Steroid injections into the shoulder joint. Physical therapy. This involves performing exercises to get the shoulder moving again. Acupuncture. This is a type of treatment that involves stimulating specific points on your body by inserting thin needles through your skin. Shoulder manipulation. This is a procedure to move the shoulder into another position. It is done after you are given a medicine to make you fall asleep (general anesthetic). The joint may also be injected with salt water at high pressure to break down  scarring. Surgery. This may be done in severe cases when other treatments have failed. Although most people recover completely from adhesive capsulitis, some may not regain full shoulder movement. Follow these instructions at home: Managing pain, stiffness, and swelling   If directed, put ice on the injured area: Put ice in a plastic bag. Place a towel between your skin and the bag. Leave the ice on for 20 minutes, 2-3 times per day. If directed, apply heat to the affected area before you exercise. Use the heat source that your health care provider recommends, such as a moist heat pack or a heating pad. Place a towel between your skin and the heat source. Leave the heat on for 20-30 minutes. Remove the heat if your skin turns bright red. This is especially important if you are unable to feel pain, heat, or cold. You may have a greater risk of getting burned. General instructions Take over-the-counter and prescription medicines only as told by your health care provider. If you are being treated with physical therapy, follow instructions from your physical therapist. Avoid exercises that put a lot of demand on your shoulder, such as throwing. These exercises can make pain worse. Keep all follow-up visits as told by your health care provider. This is important. Contact a health care provider if: You develop new symptoms. Your symptoms get worse. Summary Adhesive capsulitis, also called frozen shoulder, causes the shoulder to become stiff and painful to move. You are more likely to have this condition if you are a woman and over age 39. It is treated with physical therapy, medicines, and sometimes surgery. This information is not intended to replace advice given  to you by your health care provider. Make sure you discuss any questions you have with your health care provider. Document Revised: 02/14/2021 Document Reviewed: 02/14/2021 Elsevier Patient Education  2022 [employer].

## 2021-05-17 NOTE — Progress Notes (Signed)
Subjective:  Patient ID: Lisa Taylor, female    DOB: 09/13/1950  Age: 70 y.o. MRN: [id number]  CC: Diabetes   HPI Lisa Taylor is a 70 y.o. year old female with a history of type 2 diabetes mellitus (diet controlled with A1c of 6.2), hypertension, chronic left shoulder pain who presents today for a follow-up visit.  Interval History: She neds a form completed from the [place] which will provide her with additional friends as her husband was a English as a second language teacher.  Her major caregiver is her daughter who helps organize her pillbox and helps her with her meals.  With regards to her diabetes she has not had any hypoglycemic episode and diabetes remains diet controlled.  She is doing well on her current antihypertensive and her statin. She continues to have a poor appetite despite being placed on Remeron.  Her granddaughter reports that she just loves to snack.  She does not like to go out much but just likes to sleep at home. Currently on Namenda for memory. Denies additional concerns today.  Past Medical History:  Diagnosis Date   Anxiety    Arthritis    Cancer ([place])    BRAIN TUMOR- NO CHENO, NO RADIATION- JUST SURGERY   Diabetes mellitus without complication (HCC)    NO MEDS- DOES CHECK BLOOD SUGAR AT HOME   Glaucoma    Heart murmur    Hyperlipidemia    Hypertension    Memory deficit    Thyroid disease    PAST HX    Past Surgical History:  Procedure Laterality Date   BRAIN TUMOR SURGERY     ~1999   COLONOSCOPY      Family History  Problem Relation Age of Onset   Colon cancer Neg Hx    Esophageal cancer Neg Hx    Rectal cancer Neg Hx    Stomach cancer Neg Hx    Colon polyps Neg Hx     Allergies  Allergen Reactions   Codeine Other (See Comments)    Nausea/vomitting; some dizziness    Outpatient Medications Prior to Visit  Medication Sig Dispense Refill   aspirin (EQ ASPIRIN LOW DOSE) 81 MG chewable tablet CHEW AND SWALLOW ONE TABLET BY MOUTH ONCE DAILY 30  tablet 3   Blood Glucose Monitoring Suppl (ONE TOUCH ULTRA 2) w/Device KIT Use to check blood sugar once daily. E11.69 1 kit 0   Calcium Carbonate-Vitamin D 600-400 MG-UNIT tablet Take 1 tablet by mouth daily.     cetaphil (CETAPHIL) lotion Apply 1 application topically as needed for dry skin. 473 mL 0   cyclobenzaprine (FLEXERIL) 10 MG tablet TAKE 1 TABLET BY MOUTH TWICE DAILY AS NEEDED FOR MUSCLE SPASM 60 tablet 2   hydrocortisone 2.5 % cream Apply topically 2 (two) times daily. Apply to legs 2 times a day. Dispense 30 grams 30 g 3   OneTouch Delica Lancets 77A MISC Use to check blood sugar once daily. E11.69 100 each 2   ONETOUCH ULTRA test strip USE AS DIRECTED ONCE DAILY BEFORE BREAKFAST. 50 each 5   traMADol (ULTRAM) 50 MG tablet Take 1 tablet (50 mg total) by mouth every 12 (twelve) hours as needed. 50 tablet 1   atorvastatin (LIPITOR) 20 MG tablet Take 1 tablet by mouth once daily 90 tablet 0   carvedilol (COREG) 12.5 MG tablet TAKE 1 TABLET BY MOUTH TWICE DAILY WITH A MEAL 180 tablet 0   lisinopril-hydrochlorothiazide (ZESTORETIC) 10-12.5 MG tablet Take 1 tablet by mouth daily.  Patient needs an appointment for additional refills 90 tablet 0   memantine (NAMENDA) 5 MG tablet Take 1 tablet by mouth twice daily 90 tablet 0   mirtazapine (REMERON) 30 MG tablet TAKE 1 TABLET BY MOUTH AT BEDTIME 30 tablet 0   NIFEdipine (ADALAT CC) 90 MG 24 hr tablet Take 1 tablet by mouth once daily 90 tablet 0   No facility-administered medications prior to visit.     ROS Review of Systems  Constitutional:  Negative for activity change, appetite change and fatigue.  HENT:  Negative for congestion, sinus pressure and sore throat.   Eyes:  Negative for visual disturbance.  Respiratory:  Negative for cough, chest tightness, shortness of breath and wheezing.   Cardiovascular:  Negative for chest pain and palpitations.  Gastrointestinal:  Negative for abdominal distention, abdominal pain and constipation.   Endocrine: Negative for polydipsia.  Genitourinary:  Negative for dysuria and frequency.  Musculoskeletal:  Negative for arthralgias and back pain.  Skin:  Negative for rash.  Neurological:  Negative for tremors, light-headedness and numbness.  Hematological:  Does not bruise/bleed easily.  Psychiatric/Behavioral:  Negative for agitation and behavioral problems.    Objective:  BP 140/70 (BP Location: Right Arm, Patient Position: Sitting, Cuff Size: Small)   Pulse 82   Ht 5' (1.524 m)   Wt 90 lb 6.4 oz (41 kg)   SpO2 99%   BMI 17.66 kg/m   BP/Weight 05/17/2021 01/07/2021 5/68/1275  Systolic BP 170 017 -  Diastolic BP 70 55 -  Wt. (Lbs) 90.4 90.8 90.8  BMI 17.66 16.61 17.16      Physical Exam Constitutional:      Appearance: She is well-developed.  Cardiovascular:     Rate and Rhythm: Normal rate.     Heart sounds: Normal heart sounds. No murmur heard. Pulmonary:     Effort: Pulmonary effort is normal.     Breath sounds: Normal breath sounds. No wheezing or rales.  Chest:     Chest wall: No tenderness.  Abdominal:     General: Bowel sounds are normal. There is no distension.     Palpations: Abdomen is soft. There is no mass.     Tenderness: There is no abdominal tenderness.  Musculoskeletal:     Right lower leg: No edema.     Left lower leg: No edema.     Comments: Abduction of left upper extremity restricted to 90 degrees  Neurological:     Mental Status: She is alert and oriented to person, place, and time.  Psychiatric:        Mood and Affect: Mood normal.    CMP Latest Ref Rng & Units 05/18/2020 07/08/2019 05/20/2018  Glucose 65 - 99 mg/dL 162(H) 99 123(H)  BUN 8 - 27 mg/dL _0 Creatinine 0.57 - 1.00 mg/dL 0.88 0.77 1.01(H)  Sodium 134 - 144 mmol/L 141 140 139  Potassium 3.5 - 5.2 mmol/L 4.5 3.6 4.4  Chloride 96 - 106 mmol/L 100 100 100  CO2 20 - 29 mmol/L _1 Calcium 8.7 - 10.3 mg/dL 10.5(H) 10.2 10.7(H)  Total Protein 6.0 - 8.5 g/dL 8.2 7.7  8.0  Total Bilirubin 0.0 - 1.2 mg/dL 0.4 0.6 0.5  Alkaline Phos 44 - 121 IU/L 67 77 101  AST 0 - 40 IU/L 33 46(H) 39  ALT 0 - 32 IU/L 32 40(H) 49(H)    Lipid Panel     Component Value Date/Time   CHOL 176 07/08/2019 1630  TRIG 59 07/08/2019 1630   HDL 81 07/08/2019 1630   CHOLHDL 2.2 07/08/2019 1630   CHOLHDL 2.2 08/01/2016 1047   VLDL 9 08/01/2016 1047   LDLCALC 83 07/08/2019 1630    CBC    Component Value Date/Time   WBC 6.1 01/25/2015 0945   RBC 5.73 (H) 01/25/2015 0945   HGB 17.7 (H) 01/25/2015 0945   HCT 50.6 (H) 01/25/2015 0945   PLT 254 01/25/2015 0945   MCV 88.3 01/25/2015 0945   MCH 30.9 01/25/2015 0945   MCHC 35.0 01/25/2015 0945   RDW 13.3 01/25/2015 0945   LYMPHSABS 2.5 01/25/2015 0945   MONOABS 0.4 01/25/2015 0945   EOSABS 0.1 01/25/2015 0945   BASOSABS 0.0 01/25/2015 0945    Lab Results  Component Value Date   HGBA1C 6.2 05/17/2021    Assessment & Plan:  1. Type 2 diabetes mellitus with other specified complication, without long-term current use of insulin (HCC) Diet controlled with A1c of 6.2 Counseled on Diabetic diet, my plate method, 614 minutes of moderate intensity exercise/week Blood sugar logs with fasting goals of 80-120 mg/dl, random of less than 180 and in the event of sugars less than 60 mg/dl or greater than 400 mg/dl encouraged to notify the clinic. Advised on the need for annual eye exams, annual foot exams, Pneumonia vaccine. - POCT glycosylated hemoglobin (Hb A1C) - LP+Non-HDL Cholesterol; Future - CMP14+EGFR; Future - CBC with Differential/Platelet; Future - Ambulatory referral to Ophthalmology  2. Hypertension complicating diabetes ([place]) Controlled Counseled on blood pressure goal of less than 130/80, low-sodium, DASH diet, medication compliance, 150 minutes of moderate intensity exercise per week. Discussed medication compliance, adverse effects. - NIFEdipine (ADALAT CC) 90 MG 24 hr tablet; Take 1 tablet (90 mg total) by  mouth daily.  Dispense: 90 tablet; Refill: 1 - carvedilol (COREG) 12.5 MG tablet; Take 1 tablet (12.5 mg total) by mouth 2 (two) times daily with a meal.  Dispense: 180 tablet; Refill: 1 - lisinopril-hydrochlorothiazide (ZESTORETIC) 10-12.5 MG tablet; Take 1 tablet by mouth daily. Patient needs an appointment for additional refills  Dispense: 90 tablet; Refill: 1  3. Insomnia due to psychological stress Stable - mirtazapine (REMERON) 30 MG tablet; Take 1 tablet (30 mg total) by mouth at bedtime.  Dispense: 90 tablet; Refill: 1  4. Hyperlipidemia associated with type 2 diabetes mellitus (HCC) Controlled Low-cholesterol diet - atorvastatin (LIPITOR) 20 MG tablet; Take 1 tablet (20 mg total) by mouth daily.  Dispense: 90 tablet; Refill: 1  5. Memory loss Stable - memantine (NAMENDA) 5 MG tablet; Take 1 tablet (5 mg total) by mouth 2 (two) times daily.  Dispense: 90 tablet; Refill: 1  6. Adhesive capsulitis of left shoulder Stable She does use a muscle relaxant as needed I have completed paperwork for the [facility] as requested  7. Flu vaccine need - Flu Vaccine QUAD High Dose(Fluad)   Meds ordered this encounter  Medications   NIFEdipine (ADALAT CC) 90 MG 24 hr tablet    Sig: Take 1 tablet (90 mg total) by mouth daily.    Dispense:  90 tablet    Refill:  1   mirtazapine (REMERON) 30 MG tablet    Sig: Take 1 tablet (30 mg total) by mouth at bedtime.    Dispense:  90 tablet    Refill:  1   atorvastatin (LIPITOR) 20 MG tablet    Sig: Take 1 tablet (20 mg total) by mouth daily.    Dispense:  90 tablet    Refill:  1    Must have office visit for refills   carvedilol (COREG) 12.5 MG tablet    Sig: Take 1 tablet (12.5 mg total) by mouth 2 (two) times daily with a meal.    Dispense:  180 tablet    Refill:  1   lisinopril-hydrochlorothiazide (ZESTORETIC) 10-12.5 MG tablet    Sig: Take 1 tablet by mouth daily. Patient needs an appointment for additional refills    Dispense:  90 tablet     Refill:  1   memantine (NAMENDA) 5 MG tablet    Sig: Take 1 tablet (5 mg total) by mouth 2 (two) times daily.    Dispense:  90 tablet    Refill:  1    90 day courtesy RF     Follow-up: Return in about 6 months (around 11/14/2021) for Chronic medical conditions.       Charlott Rakes, MD, FAAFP. [facility] [place], [place]   05/17/2021, 5:56 PM

## 2021-05-18 ENCOUNTER — Ambulatory Visit: Payer: PPO | Attending: Family Medicine

## 2021-05-18 DIAGNOSIS — E1169 Type 2 diabetes mellitus with other specified complication: Secondary | ICD-10-CM | POA: Diagnosis not present

## 2021-05-19 LAB — CMP14+EGFR
ALT: 13 IU/L (ref 0–32)
AST: 29 IU/L (ref 0–40)
Albumin/Globulin Ratio: 1.3 (ref 1.2–2.2)
Albumin: 4.5 g/dL (ref 3.7–4.7)
Alkaline Phosphatase: 62 IU/L (ref 44–121)
BUN/Creatinine Ratio: 13 (ref 12–28)
BUN: 17 mg/dL (ref 8–27)
Bilirubin Total: 0.4 mg/dL (ref 0.0–1.2)
CO2: 24 mmol/L (ref 20–29)
Calcium: 10 mg/dL (ref 8.7–10.3)
Chloride: 95 mmol/L — ABNORMAL LOW (ref 96–106)
Creatinine, Ser: 1.32 mg/dL — ABNORMAL HIGH (ref 0.57–1.00)
Globulin, Total: 3.5 g/dL (ref 1.5–4.5)
Glucose: 92 mg/dL (ref 70–99)
Potassium: 4.1 mmol/L (ref 3.5–5.2)
Sodium: 138 mmol/L (ref 134–144)
Total Protein: 8 g/dL (ref 6.0–8.5)
eGFR: 43 mL/min/{1.73_m2} — ABNORMAL LOW (ref >59)

## 2021-05-19 LAB — LP+NON-HDL CHOLESTEROL
Cholesterol, Total: 141 mg/dL (ref 100–199)
HDL: 38 mg/dL — ABNORMAL LOW (ref >39)
LDL Chol Calc (NIH): 84 mg/dL (ref 0–99)
Total Non-HDL-Chol (LDL+VLDL): 103 mg/dL (ref 0–129)
Triglycerides: 102 mg/dL (ref 0–149)
VLDL Cholesterol Cal: 19 mg/dL (ref 5–40)

## 2021-05-19 LAB — CBC WITH DIFFERENTIAL/PLATELET
Basophils Absolute: 0 10*3/uL (ref 0.0–0.2)
Basos: 0 %
EOS (ABSOLUTE): 0 10*3/uL (ref 0.0–0.4)
Eos: 0 %
Hematocrit: 39.1 % (ref 34.0–46.6)
Hemoglobin: 13.5 g/dL (ref 11.1–15.9)
Immature Grans (Abs): 0 10*3/uL (ref 0.0–0.1)
Immature Granulocytes: 0 %
Lymphocytes Absolute: 1.3 10*3/uL (ref 0.7–3.1)
Lymphs: 19 %
MCH: 30.7 pg (ref 26.6–33.0)
MCHC: 34.5 g/dL (ref 31.5–35.7)
MCV: 89 fL (ref 79–97)
Monocytes Absolute: 0.8 10*3/uL (ref 0.1–0.9)
Monocytes: 12 %
Neutrophils Absolute: 4.7 10*3/uL (ref 1.4–7.0)
Neutrophils: 69 %
Platelets: 210 10*3/uL (ref 150–450)
RBC: 4.4 x10E6/uL (ref 3.77–5.28)
RDW: 12.1 % (ref 11.7–15.4)
WBC: 6.8 10*3/uL (ref 3.4–10.8)

## 2021-05-20 ENCOUNTER — Telehealth: Payer: Self-pay

## 2021-05-20 NOTE — Telephone Encounter (Signed)
Patient name and DOB has been verified Patient was informed of lab results. Patient had no questions.  

## 2021-05-20 NOTE — Telephone Encounter (Signed)
-----   Message from Charlott Rakes, MD sent at 05/19/2021 12:14 PM EST ----- Please advise her that her cholesterol, liver functions are normal, she is not anemic.  Kidney function is slightly abnormal compared to previous labs and I would advise her to reduce intake of medications like Aleve and ibuprofen and use Tylenol instead for pain.

## 2021-07-11 ENCOUNTER — Other Ambulatory Visit: Payer: Self-pay | Admitting: Family Medicine

## 2021-07-11 DIAGNOSIS — M7502 Adhesive capsulitis of left shoulder: Secondary | ICD-10-CM

## 2021-07-12 NOTE — Telephone Encounter (Signed)
Requested medication (s) are due for refill today: yes  Requested medication (s) are on the active medication list: yes  Last refill:  05/30/2020  Future visit scheduled: no, seen 05/17/21  Notes to clinic:  This medication can not be delegated, please assess.   Requested Prescriptions  Pending Prescriptions Disp Refills   cyclobenzaprine (FLEXERIL) 10 MG tablet [Pharmacy Med Name: Cyclobenzaprine HCl 10 MG Oral Tablet] 60 tablet 0    Sig: TAKE 1 TABLET BY MOUTH TWICE DAILY AS NEEDED FOR MUSCLE SPASM     Not Delegated - Analgesics:  Muscle Relaxants Failed - 07/11/2021  7:45 PM      Failed - This refill cannot be delegated      Passed - Valid encounter within last 6 months    Recent Outpatient Visits           1 month ago Type 2 diabetes mellitus with other specified complication, without long-term current use of insulin ([place])   [place], [place], MD   10 months ago Type 2 diabetes mellitus with other specified complication, without long-term current use of insulin ([place])   [place], [place], MD   1 year ago Type 2 diabetes mellitus with other specified complication, without long-term current use of insulin ([place])   [place], [place], MD   2 years ago Type 2 diabetes mellitus without complication, without long-term current use of insulin ([place])   [place], [place], MD   2 years ago Type 2 diabetes mellitus without complication, without long-term current use of insulin ([place])    [facility] Charlott Rakes, MD

## 2021-07-27 ENCOUNTER — Ambulatory Visit: Payer: Self-pay

## 2021-07-27 NOTE — Telephone Encounter (Signed)
Scheduled appt for patient to follow up for concerns. Spoke to [facility] for appt.

## 2021-07-27 NOTE — Telephone Encounter (Signed)
Granddaughter calling to ask what to do about pt.  She is not with the pt,but she says she has power of attorney.  She says pt is losing weight, not drinking/eating much and they do not know what to do.  Would like advice     Chief Complaint: Granddaughter states pt. Continues to have poor appetite and is losing weight. Has increased weakness as well. Symptoms: Decreased appetite."This started months ago." Frequency: Above Pertinent Negatives: Patient denies  Disposition: [] ED /[] Urgent Care (no appt availability in office) / [] Appointment(In office/virtual)/ []  [place] [facility]/ [] Home Care/ [] Refused Recommended Disposition /[] [place] [facility]/ []  Follow-up with PCP Additional Notes: Granddaughter asking for appointment as soon as possible. Please advise.  Answer Assessment - Initial Assessment Questions 1. DESCRIPTION: "Describe how you are feeling."     Seems weaker 2. SEVERITY: "How bad is it?"  "Can you stand and walk?"   - MILD - Feels weak or tired, but does not interfere with work, school or normal activities   - [place] to stand and walk; weakness interferes with work, school, or normal activities   - SEVERE - Unable to stand or walk     Mild 3. ONSET:  "When did the weakness begin?"     Has been going on for months 4. CAUSE: "What do you think is causing the weakness?"     Not eating well 5. MEDICINES: "Have you recently started a new medicine or had a change in the amount of a medicine?"     No 6. OTHER SYMPTOMS: "Do you have any other symptoms?" (e.g., chest pain, fever, cough, SOB, vomiting, diarrhea, bleeding, other areas of pain)     Loss of appetite 7. PREGNANCY: "Is there any chance you are pregnant?" "When was your last menstrual period?"     No  Protocols used: Weakness (Generalized) and Fatigue-A-AH

## 2021-07-29 ENCOUNTER — Telehealth: Payer: Self-pay | Admitting: Family Medicine

## 2021-07-29 NOTE — Telephone Encounter (Signed)
No availability at those times due to lunch and short notice of appt. Would be able to get the 1:45 time if scheduled further out. POA request to keep the appt as is.   Informed of new location. Verbalized understanding.

## 2021-07-29 NOTE — Telephone Encounter (Signed)
Copied from [place] [id number]. Topic: General - Other >> Jul 29, 2021  8:24 AM Leward Quan A wrote: Reason for CRM: Patient granddaughter Claiborne Billings called in stated that patient need to have both POA present at her appointment but the time that visit is scheduled need to be changed to about 12 noon or 1.45 PM because that time works better say that they need to discuss patients rapid weight loss and other serious issues as to what will be done with the patient asking if nurse can call back to discuss changes. Please call Claiborne Billings at Ph# [phone]

## 2021-08-04 DIAGNOSIS — R935 Abnormal findings on diagnostic imaging of other abdominal regions, including retroperitoneum: Secondary | ICD-10-CM | POA: Diagnosis not present

## 2021-08-04 DIAGNOSIS — K8020 Calculus of gallbladder without cholecystitis without obstruction: Secondary | ICD-10-CM | POA: Diagnosis not present

## 2021-08-04 DIAGNOSIS — I10 Essential (primary) hypertension: Secondary | ICD-10-CM | POA: Diagnosis not present

## 2021-08-04 DIAGNOSIS — E46 Unspecified protein-calorie malnutrition: Secondary | ICD-10-CM | POA: Diagnosis not present

## 2021-08-04 DIAGNOSIS — I959 Hypotension, unspecified: Secondary | ICD-10-CM | POA: Diagnosis not present

## 2021-08-04 DIAGNOSIS — R531 Weakness: Secondary | ICD-10-CM | POA: Diagnosis not present

## 2021-08-04 DIAGNOSIS — Z515 Encounter for palliative care: Secondary | ICD-10-CM | POA: Diagnosis not present

## 2021-08-04 DIAGNOSIS — Z66 Do not resuscitate: Secondary | ICD-10-CM | POA: Diagnosis not present

## 2021-08-04 DIAGNOSIS — E860 Dehydration: Secondary | ICD-10-CM | POA: Diagnosis not present

## 2021-08-04 DIAGNOSIS — R918 Other nonspecific abnormal finding of lung field: Secondary | ICD-10-CM | POA: Diagnosis not present

## 2021-08-04 DIAGNOSIS — Z20822 Contact with and (suspected) exposure to covid-19: Secondary | ICD-10-CM | POA: Diagnosis not present

## 2021-08-04 DIAGNOSIS — E785 Hyperlipidemia, unspecified: Secondary | ICD-10-CM | POA: Diagnosis not present

## 2021-08-04 DIAGNOSIS — N838 Other noninflammatory disorders of ovary, fallopian tube and broad ligament: Secondary | ICD-10-CM | POA: Diagnosis not present

## 2021-08-04 DIAGNOSIS — N289 Disorder of kidney and ureter, unspecified: Secondary | ICD-10-CM | POA: Diagnosis not present

## 2021-08-04 DIAGNOSIS — R0603 Acute respiratory distress: Secondary | ICD-10-CM | POA: Diagnosis not present

## 2021-08-04 DIAGNOSIS — N179 Acute kidney failure, unspecified: Secondary | ICD-10-CM | POA: Diagnosis not present

## 2021-08-04 DIAGNOSIS — R1909 Other intra-abdominal and pelvic swelling, mass and lump: Secondary | ICD-10-CM | POA: Diagnosis not present

## 2021-08-04 DIAGNOSIS — F17210 Nicotine dependence, cigarettes, uncomplicated: Secondary | ICD-10-CM | POA: Diagnosis not present

## 2021-08-04 DIAGNOSIS — E861 Hypovolemia: Secondary | ICD-10-CM | POA: Diagnosis not present

## 2021-08-04 DIAGNOSIS — Z681 Body mass index (BMI) 19 or less, adult: Secondary | ICD-10-CM | POA: Diagnosis not present

## 2021-08-04 DIAGNOSIS — I950 Idiopathic hypotension: Secondary | ICD-10-CM | POA: Diagnosis not present

## 2021-08-04 DIAGNOSIS — R634 Abnormal weight loss: Secondary | ICD-10-CM | POA: Diagnosis not present

## 2021-08-04 DIAGNOSIS — B370 Candidal stomatitis: Secondary | ICD-10-CM | POA: Diagnosis not present

## 2021-08-04 DIAGNOSIS — R5381 Other malaise: Secondary | ICD-10-CM | POA: Diagnosis not present

## 2021-08-04 DIAGNOSIS — R0902 Hypoxemia: Secondary | ICD-10-CM | POA: Diagnosis not present

## 2021-08-04 DIAGNOSIS — I493 Ventricular premature depolarization: Secondary | ICD-10-CM | POA: Diagnosis not present

## 2021-08-04 DIAGNOSIS — K7689 Other specified diseases of liver: Secondary | ICD-10-CM | POA: Diagnosis not present

## 2021-08-04 DIAGNOSIS — R1310 Dysphagia, unspecified: Secondary | ICD-10-CM | POA: Diagnosis not present

## 2021-08-04 DIAGNOSIS — E43 Unspecified severe protein-calorie malnutrition: Secondary | ICD-10-CM | POA: Diagnosis not present

## 2021-08-04 DIAGNOSIS — F0390 Unspecified dementia without behavioral disturbance: Secondary | ICD-10-CM | POA: Diagnosis not present

## 2021-08-04 DIAGNOSIS — Z743 Need for continuous supervision: Secondary | ICD-10-CM | POA: Diagnosis not present

## 2021-08-04 DIAGNOSIS — G3184 Mild cognitive impairment, so stated: Secondary | ICD-10-CM | POA: Diagnosis not present

## 2021-08-04 DIAGNOSIS — R1900 Intra-abdominal and pelvic swelling, mass and lump, unspecified site: Secondary | ICD-10-CM | POA: Diagnosis not present

## 2021-08-04 DIAGNOSIS — E872 Acidosis, unspecified: Secondary | ICD-10-CM | POA: Diagnosis not present

## 2021-08-04 DIAGNOSIS — E119 Type 2 diabetes mellitus without complications: Secondary | ICD-10-CM | POA: Diagnosis not present

## 2021-08-04 DIAGNOSIS — E876 Hypokalemia: Secondary | ICD-10-CM | POA: Diagnosis not present

## 2021-08-04 DIAGNOSIS — Z7982 Long term (current) use of aspirin: Secondary | ICD-10-CM | POA: Diagnosis not present

## 2021-08-04 DIAGNOSIS — F03B0 Unspecified dementia, moderate, without behavioral disturbance, psychotic disturbance, mood disturbance, and anxiety: Secondary | ICD-10-CM | POA: Diagnosis not present

## 2021-08-04 DIAGNOSIS — J439 Emphysema, unspecified: Secondary | ICD-10-CM | POA: Diagnosis not present

## 2021-08-04 DIAGNOSIS — D649 Anemia, unspecified: Secondary | ICD-10-CM | POA: Diagnosis not present

## 2021-08-04 DIAGNOSIS — C3411 Malignant neoplasm of upper lobe, right bronchus or lung: Secondary | ICD-10-CM | POA: Diagnosis not present

## 2021-08-04 DIAGNOSIS — D3501 Benign neoplasm of right adrenal gland: Secondary | ICD-10-CM | POA: Diagnosis not present

## 2021-08-04 DIAGNOSIS — N9489 Other specified conditions associated with female genital organs and menstrual cycle: Secondary | ICD-10-CM | POA: Diagnosis not present

## 2021-08-04 DIAGNOSIS — R64 Cachexia: Secondary | ICD-10-CM | POA: Diagnosis not present

## 2021-08-04 DIAGNOSIS — R846 Abnormal cytological findings in specimens from respiratory organs and thorax: Secondary | ICD-10-CM | POA: Diagnosis not present

## 2021-08-04 DIAGNOSIS — I700 Atherosclerosis of aorta: Secondary | ICD-10-CM | POA: Diagnosis not present

## 2021-08-04 DIAGNOSIS — D72829 Elevated white blood cell count, unspecified: Secondary | ICD-10-CM | POA: Diagnosis not present

## 2021-08-04 DIAGNOSIS — R197 Diarrhea, unspecified: Secondary | ICD-10-CM | POA: Diagnosis not present

## 2021-08-04 DIAGNOSIS — C3490 Malignant neoplasm of unspecified part of unspecified bronchus or lung: Secondary | ICD-10-CM | POA: Diagnosis not present

## 2021-08-05 DIAGNOSIS — D72829 Elevated white blood cell count, unspecified: Secondary | ICD-10-CM | POA: Diagnosis not present

## 2021-08-05 DIAGNOSIS — K7689 Other specified diseases of liver: Secondary | ICD-10-CM | POA: Diagnosis not present

## 2021-08-05 DIAGNOSIS — I10 Essential (primary) hypertension: Secondary | ICD-10-CM | POA: Diagnosis not present

## 2021-08-05 DIAGNOSIS — E46 Unspecified protein-calorie malnutrition: Secondary | ICD-10-CM | POA: Diagnosis not present

## 2021-08-05 DIAGNOSIS — I959 Hypotension, unspecified: Secondary | ICD-10-CM | POA: Diagnosis not present

## 2021-08-05 DIAGNOSIS — R918 Other nonspecific abnormal finding of lung field: Secondary | ICD-10-CM | POA: Diagnosis not present

## 2021-08-05 DIAGNOSIS — N179 Acute kidney failure, unspecified: Secondary | ICD-10-CM | POA: Diagnosis not present

## 2021-08-05 DIAGNOSIS — D3501 Benign neoplasm of right adrenal gland: Secondary | ICD-10-CM | POA: Diagnosis not present

## 2021-08-05 DIAGNOSIS — I493 Ventricular premature depolarization: Secondary | ICD-10-CM | POA: Diagnosis not present

## 2021-08-06 DIAGNOSIS — K8020 Calculus of gallbladder without cholecystitis without obstruction: Secondary | ICD-10-CM | POA: Diagnosis not present

## 2021-08-06 DIAGNOSIS — Z7982 Long term (current) use of aspirin: Secondary | ICD-10-CM | POA: Diagnosis not present

## 2021-08-06 DIAGNOSIS — I959 Hypotension, unspecified: Secondary | ICD-10-CM | POA: Diagnosis not present

## 2021-08-06 DIAGNOSIS — I10 Essential (primary) hypertension: Secondary | ICD-10-CM | POA: Diagnosis not present

## 2021-08-06 DIAGNOSIS — G3184 Mild cognitive impairment, so stated: Secondary | ICD-10-CM | POA: Diagnosis not present

## 2021-08-06 DIAGNOSIS — Z66 Do not resuscitate: Secondary | ICD-10-CM | POA: Diagnosis not present

## 2021-08-06 DIAGNOSIS — N838 Other noninflammatory disorders of ovary, fallopian tube and broad ligament: Secondary | ICD-10-CM | POA: Diagnosis not present

## 2021-08-06 DIAGNOSIS — E872 Acidosis, unspecified: Secondary | ICD-10-CM | POA: Diagnosis not present

## 2021-08-06 DIAGNOSIS — R918 Other nonspecific abnormal finding of lung field: Secondary | ICD-10-CM | POA: Diagnosis not present

## 2021-08-06 DIAGNOSIS — R64 Cachexia: Secondary | ICD-10-CM | POA: Diagnosis not present

## 2021-08-06 DIAGNOSIS — R1900 Intra-abdominal and pelvic swelling, mass and lump, unspecified site: Secondary | ICD-10-CM | POA: Diagnosis not present

## 2021-08-06 DIAGNOSIS — D72829 Elevated white blood cell count, unspecified: Secondary | ICD-10-CM | POA: Diagnosis not present

## 2021-08-06 DIAGNOSIS — N289 Disorder of kidney and ureter, unspecified: Secondary | ICD-10-CM | POA: Diagnosis not present

## 2021-08-06 DIAGNOSIS — E43 Unspecified severe protein-calorie malnutrition: Secondary | ICD-10-CM | POA: Diagnosis not present

## 2021-08-07 DIAGNOSIS — E46 Unspecified protein-calorie malnutrition: Secondary | ICD-10-CM | POA: Diagnosis not present

## 2021-08-07 DIAGNOSIS — I10 Essential (primary) hypertension: Secondary | ICD-10-CM | POA: Diagnosis not present

## 2021-08-07 DIAGNOSIS — R1909 Other intra-abdominal and pelvic swelling, mass and lump: Secondary | ICD-10-CM | POA: Diagnosis not present

## 2021-08-07 DIAGNOSIS — R918 Other nonspecific abnormal finding of lung field: Secondary | ICD-10-CM | POA: Diagnosis not present

## 2021-08-07 DIAGNOSIS — D72829 Elevated white blood cell count, unspecified: Secondary | ICD-10-CM | POA: Diagnosis not present

## 2021-08-08 DIAGNOSIS — I950 Idiopathic hypotension: Secondary | ICD-10-CM | POA: Diagnosis not present

## 2021-08-08 DIAGNOSIS — N179 Acute kidney failure, unspecified: Secondary | ICD-10-CM | POA: Diagnosis not present

## 2021-08-08 DIAGNOSIS — I700 Atherosclerosis of aorta: Secondary | ICD-10-CM | POA: Diagnosis not present

## 2021-08-08 DIAGNOSIS — I10 Essential (primary) hypertension: Secondary | ICD-10-CM | POA: Diagnosis not present

## 2021-08-08 DIAGNOSIS — Z7982 Long term (current) use of aspirin: Secondary | ICD-10-CM | POA: Diagnosis not present

## 2021-08-08 DIAGNOSIS — N838 Other noninflammatory disorders of ovary, fallopian tube and broad ligament: Secondary | ICD-10-CM | POA: Diagnosis not present

## 2021-08-08 DIAGNOSIS — E872 Acidosis, unspecified: Secondary | ICD-10-CM | POA: Diagnosis not present

## 2021-08-08 DIAGNOSIS — F0390 Unspecified dementia without behavioral disturbance: Secondary | ICD-10-CM | POA: Diagnosis not present

## 2021-08-08 DIAGNOSIS — I959 Hypotension, unspecified: Secondary | ICD-10-CM | POA: Diagnosis not present

## 2021-08-08 DIAGNOSIS — E43 Unspecified severe protein-calorie malnutrition: Secondary | ICD-10-CM | POA: Diagnosis not present

## 2021-08-08 DIAGNOSIS — E876 Hypokalemia: Secondary | ICD-10-CM | POA: Diagnosis not present

## 2021-08-08 DIAGNOSIS — R64 Cachexia: Secondary | ICD-10-CM | POA: Diagnosis not present

## 2021-08-08 DIAGNOSIS — D72829 Elevated white blood cell count, unspecified: Secondary | ICD-10-CM | POA: Diagnosis not present

## 2021-08-08 DIAGNOSIS — D649 Anemia, unspecified: Secondary | ICD-10-CM | POA: Diagnosis not present

## 2021-08-08 DIAGNOSIS — R918 Other nonspecific abnormal finding of lung field: Secondary | ICD-10-CM | POA: Diagnosis not present

## 2021-08-08 DIAGNOSIS — R634 Abnormal weight loss: Secondary | ICD-10-CM | POA: Diagnosis not present

## 2021-08-08 DIAGNOSIS — R935 Abnormal findings on diagnostic imaging of other abdominal regions, including retroperitoneum: Secondary | ICD-10-CM | POA: Diagnosis not present

## 2021-08-08 DIAGNOSIS — R5381 Other malaise: Secondary | ICD-10-CM | POA: Diagnosis not present

## 2021-08-08 DIAGNOSIS — Z515 Encounter for palliative care: Secondary | ICD-10-CM | POA: Diagnosis not present

## 2021-08-08 DIAGNOSIS — G3184 Mild cognitive impairment, so stated: Secondary | ICD-10-CM | POA: Diagnosis not present

## 2021-08-08 DIAGNOSIS — Z66 Do not resuscitate: Secondary | ICD-10-CM | POA: Diagnosis not present

## 2021-08-09 ENCOUNTER — Other Ambulatory Visit: Payer: Self-pay

## 2021-08-09 ENCOUNTER — Ambulatory Visit: Payer: PPO | Attending: Family Medicine | Admitting: Family Medicine

## 2021-08-09 ENCOUNTER — Telehealth: Payer: Self-pay

## 2021-08-09 DIAGNOSIS — N179 Acute kidney failure, unspecified: Secondary | ICD-10-CM | POA: Diagnosis not present

## 2021-08-09 DIAGNOSIS — R918 Other nonspecific abnormal finding of lung field: Secondary | ICD-10-CM | POA: Diagnosis not present

## 2021-08-09 DIAGNOSIS — N9489 Other specified conditions associated with female genital organs and menstrual cycle: Secondary | ICD-10-CM | POA: Diagnosis not present

## 2021-08-09 NOTE — Telephone Encounter (Signed)
Pt daughter is requesting home health orders for patient, daughter states that she does not have a discharge date. Need paperwork filled out for [facility] funds to assist with caregiver forms.  Granddaughter is asking for PCP to contact her to discuss patients health and why she is in the hospital. Granddaughter has POA [phone] [place]. She is requesting that you contact her

## 2021-08-10 ENCOUNTER — Telehealth: Payer: PPO | Admitting: Family Medicine

## 2021-08-10 DIAGNOSIS — R846 Abnormal cytological findings in specimens from respiratory organs and thorax: Secondary | ICD-10-CM | POA: Diagnosis not present

## 2021-08-10 NOTE — Telephone Encounter (Signed)
Pt daughter called in stating they are trying to et forms signed for the pt, she states she has been trying to do this and is the pts POA. She states this is very important and really needs someone to reach out asap. Please advise.

## 2021-08-10 NOTE — Telephone Encounter (Signed)
I spoke with the patient's granddaughter La-Shauna at 8:29 AM regarding paperwork from the [place] which she dropped off.  She has several questions about her grandmothers hospitalization and care which I answered.  She was also not happy that lab results after her visit in 05/2021 were related to her grandmother who does have slight memory impairment.  She states results should have been relayed to the Caregivers instead.  I did explain that Ms Ena Dawley carries on intelligent conversations and does not come across as having trouble with comprehension and that is why Staff member would have relayed result to her. I have assured her we will switch around the phone numbers so that when the clinic calls they will be contacting either the granddaughter or daughter both of whom are power of attorney for the patient. She is requesting PCS services as well and I will place orders for home health and  her [place] paperwork has been completed.

## 2021-08-11 ENCOUNTER — Telehealth: Payer: Self-pay

## 2021-08-11 DIAGNOSIS — R5381 Other malaise: Secondary | ICD-10-CM | POA: Diagnosis not present

## 2021-08-11 DIAGNOSIS — N179 Acute kidney failure, unspecified: Secondary | ICD-10-CM | POA: Diagnosis not present

## 2021-08-11 DIAGNOSIS — E876 Hypokalemia: Secondary | ICD-10-CM | POA: Diagnosis not present

## 2021-08-11 DIAGNOSIS — R918 Other nonspecific abnormal finding of lung field: Secondary | ICD-10-CM | POA: Diagnosis not present

## 2021-08-11 DIAGNOSIS — D72829 Elevated white blood cell count, unspecified: Secondary | ICD-10-CM | POA: Diagnosis not present

## 2021-08-11 DIAGNOSIS — F0390 Unspecified dementia without behavioral disturbance: Secondary | ICD-10-CM | POA: Diagnosis not present

## 2021-08-11 DIAGNOSIS — E43 Unspecified severe protein-calorie malnutrition: Secondary | ICD-10-CM | POA: Diagnosis not present

## 2021-08-11 DIAGNOSIS — D649 Anemia, unspecified: Secondary | ICD-10-CM | POA: Diagnosis not present

## 2021-08-11 DIAGNOSIS — Z515 Encounter for palliative care: Secondary | ICD-10-CM | POA: Diagnosis not present

## 2021-08-11 NOTE — Telephone Encounter (Signed)
PCP has spoken with Granddaughter.

## 2021-08-12 ENCOUNTER — Telehealth: Payer: Self-pay | Admitting: Family Medicine

## 2021-08-12 DIAGNOSIS — R918 Other nonspecific abnormal finding of lung field: Secondary | ICD-10-CM | POA: Diagnosis not present

## 2021-08-12 DIAGNOSIS — Z515 Encounter for palliative care: Secondary | ICD-10-CM | POA: Diagnosis not present

## 2021-08-12 DIAGNOSIS — N179 Acute kidney failure, unspecified: Secondary | ICD-10-CM | POA: Diagnosis not present

## 2021-08-12 DIAGNOSIS — E43 Unspecified severe protein-calorie malnutrition: Secondary | ICD-10-CM | POA: Diagnosis not present

## 2021-08-12 DIAGNOSIS — E876 Hypokalemia: Secondary | ICD-10-CM | POA: Diagnosis not present

## 2021-08-12 DIAGNOSIS — F0390 Unspecified dementia without behavioral disturbance: Secondary | ICD-10-CM | POA: Diagnosis not present

## 2021-08-12 DIAGNOSIS — D72829 Elevated white blood cell count, unspecified: Secondary | ICD-10-CM | POA: Diagnosis not present

## 2021-08-12 DIAGNOSIS — N9489 Other specified conditions associated with female genital organs and menstrual cycle: Secondary | ICD-10-CM | POA: Diagnosis not present

## 2021-08-12 DIAGNOSIS — R5381 Other malaise: Secondary | ICD-10-CM | POA: Diagnosis not present

## 2021-08-12 DIAGNOSIS — D649 Anemia, unspecified: Secondary | ICD-10-CM | POA: Diagnosis not present

## 2021-08-12 NOTE — Telephone Encounter (Signed)
Dr Margarita Rana  -check medical updates,  summary discharge in Epic Be prepared for communicate and  coordination of care referral, (oncology),  medication review and make all medication orders and sent to the pharmacy, @ [facility] on [address], Order of x-small diapers, check if they have any child sized blood pressure cup/order blood cup

## 2021-08-13 DIAGNOSIS — D72829 Elevated white blood cell count, unspecified: Secondary | ICD-10-CM | POA: Diagnosis not present

## 2021-08-13 DIAGNOSIS — C3411 Malignant neoplasm of upper lobe, right bronchus or lung: Secondary | ICD-10-CM | POA: Diagnosis not present

## 2021-08-13 DIAGNOSIS — E872 Acidosis, unspecified: Secondary | ICD-10-CM | POA: Diagnosis not present

## 2021-08-13 DIAGNOSIS — E860 Dehydration: Secondary | ICD-10-CM | POA: Diagnosis not present

## 2021-08-13 DIAGNOSIS — R0902 Hypoxemia: Secondary | ICD-10-CM | POA: Diagnosis not present

## 2021-08-13 DIAGNOSIS — E43 Unspecified severe protein-calorie malnutrition: Secondary | ICD-10-CM | POA: Diagnosis not present

## 2021-08-13 DIAGNOSIS — N179 Acute kidney failure, unspecified: Secondary | ICD-10-CM | POA: Diagnosis not present

## 2021-08-13 DIAGNOSIS — Z66 Do not resuscitate: Secondary | ICD-10-CM | POA: Diagnosis not present

## 2021-08-13 DIAGNOSIS — R0603 Acute respiratory distress: Secondary | ICD-10-CM | POA: Diagnosis not present

## 2021-08-13 DIAGNOSIS — I959 Hypotension, unspecified: Secondary | ICD-10-CM | POA: Diagnosis not present

## 2021-08-15 MED ORDER — MISC. DEVICES MISC: 0 refills | Status: AC

## 2021-08-15 NOTE — Telephone Encounter (Signed)
FYI.  I will do DME orders once patient is discharged.

## 2021-08-22 DIAGNOSIS — R627 Adult failure to thrive: Secondary | ICD-10-CM | POA: Diagnosis not present

## 2021-08-22 DIAGNOSIS — N179 Acute kidney failure, unspecified: Secondary | ICD-10-CM | POA: Diagnosis not present

## 2021-08-22 DIAGNOSIS — I950 Idiopathic hypotension: Secondary | ICD-10-CM | POA: Diagnosis not present

## 2021-08-22 DIAGNOSIS — R918 Other nonspecific abnormal finding of lung field: Secondary | ICD-10-CM | POA: Diagnosis not present

## 2021-08-31 DEATH — deceased

## 2022-08-18 IMAGING — MG MM DIGITAL SCREENING BILAT W/ TOMO AND CAD
6 of 12 series · 6 of 36 positions shown · non-contrast
Comparison: Previous exam(s).

CLINICAL DATA: Screening.

EXAM:
DIGITAL SCREENING BILATERAL MAMMOGRAM WITH TOMO AND CAD

[L CC synth-2D]
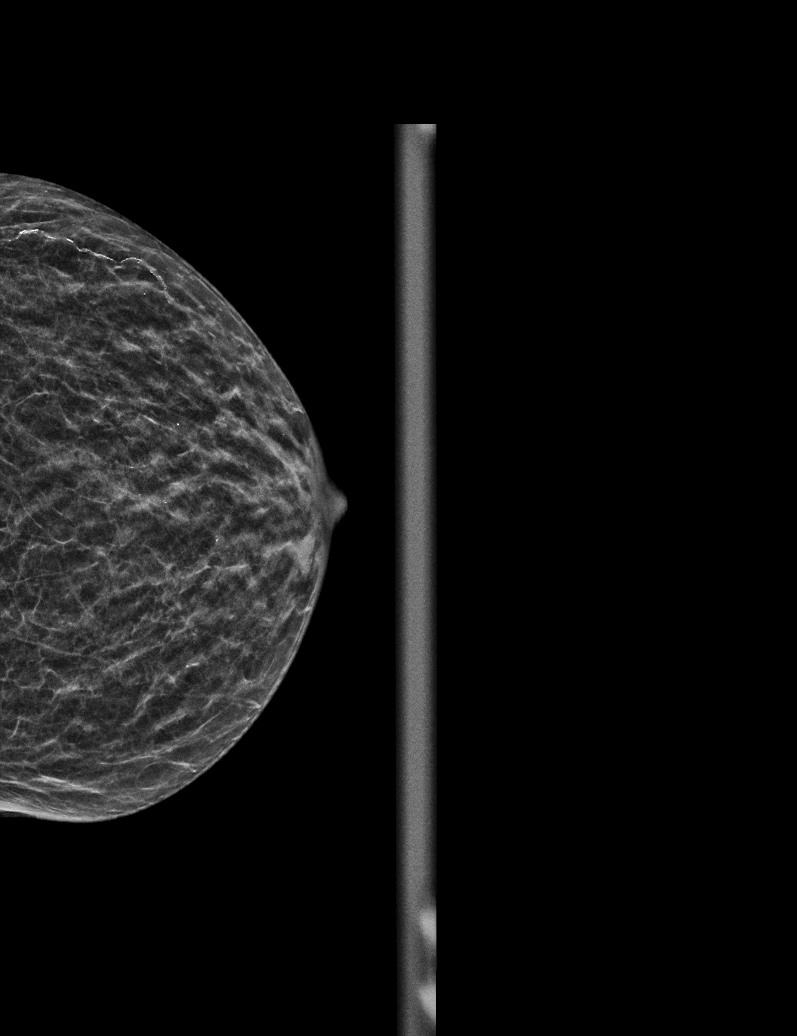

[R MLO synth-2D (1 of 2)]
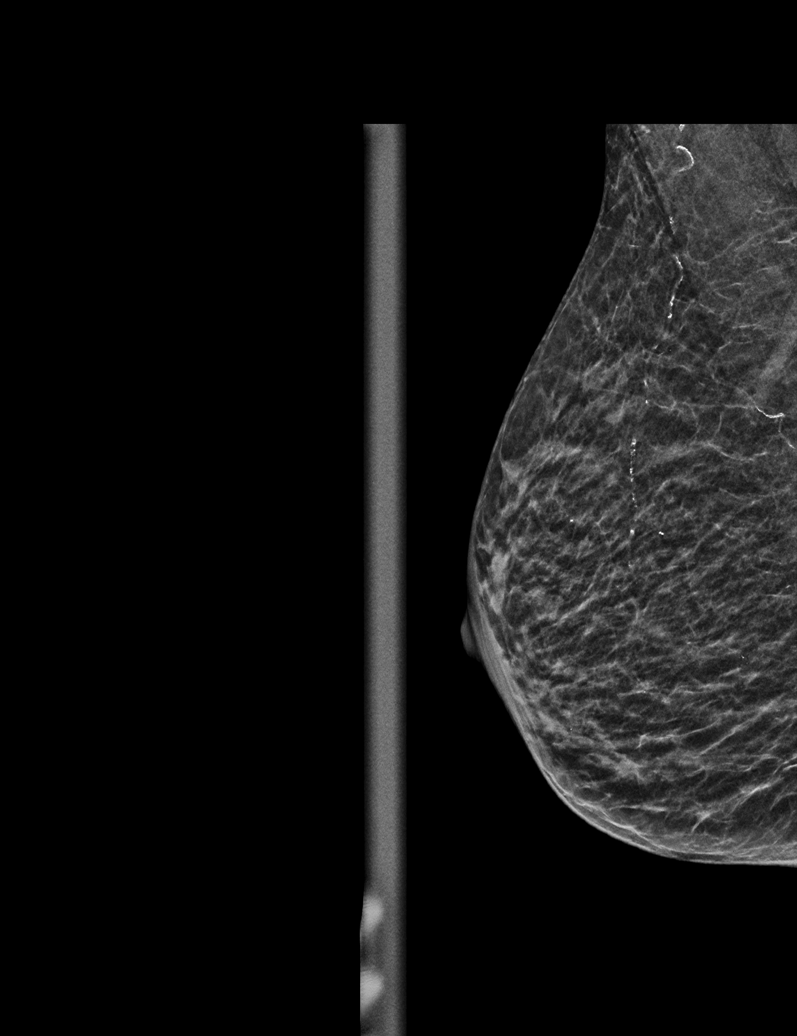

[L MLO synth-2D (1 of 2)]
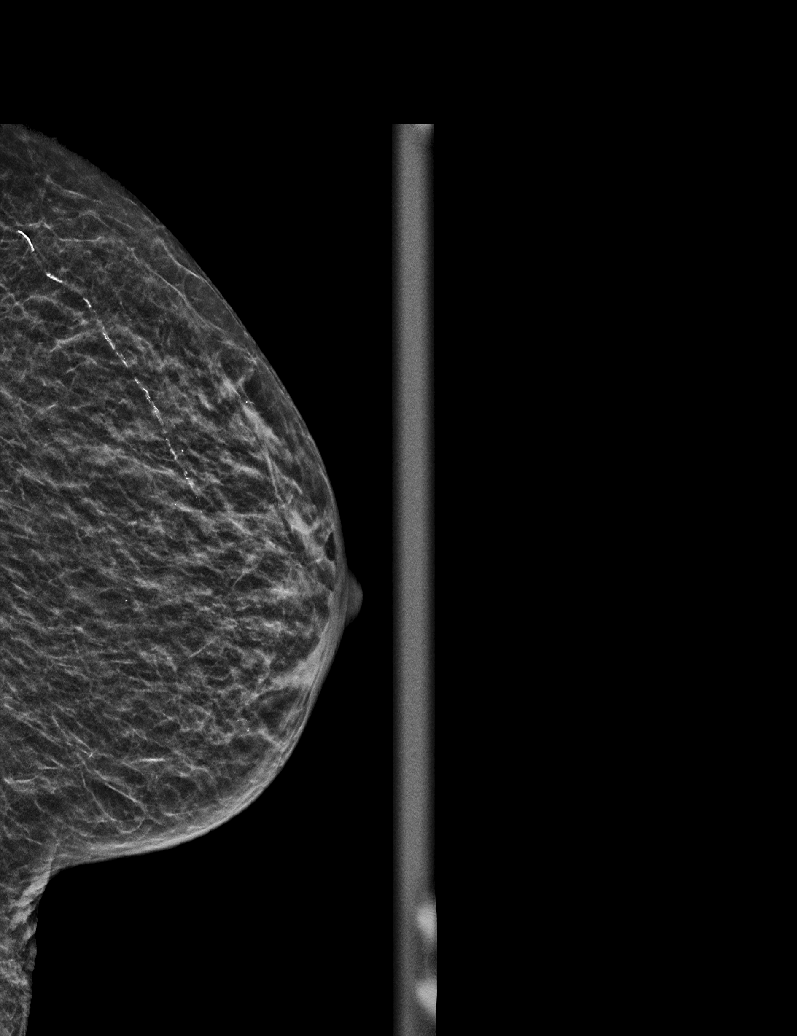

[R MLO synth-2D (2 of 2)]
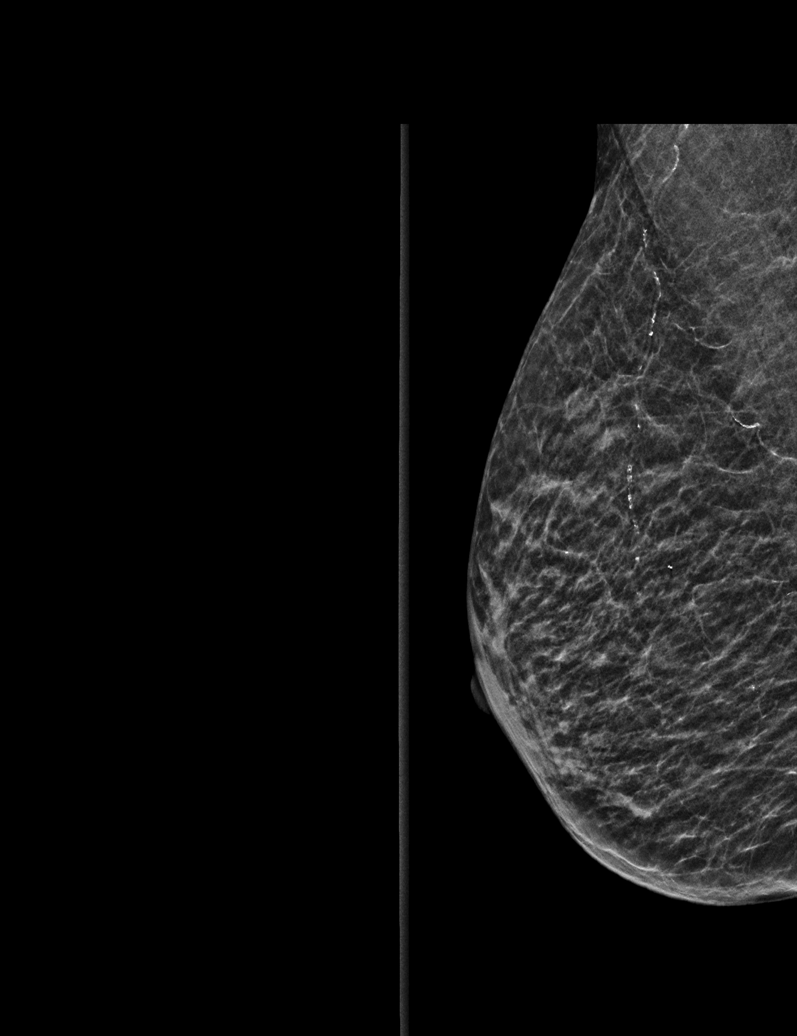

[R CC synth-2D]
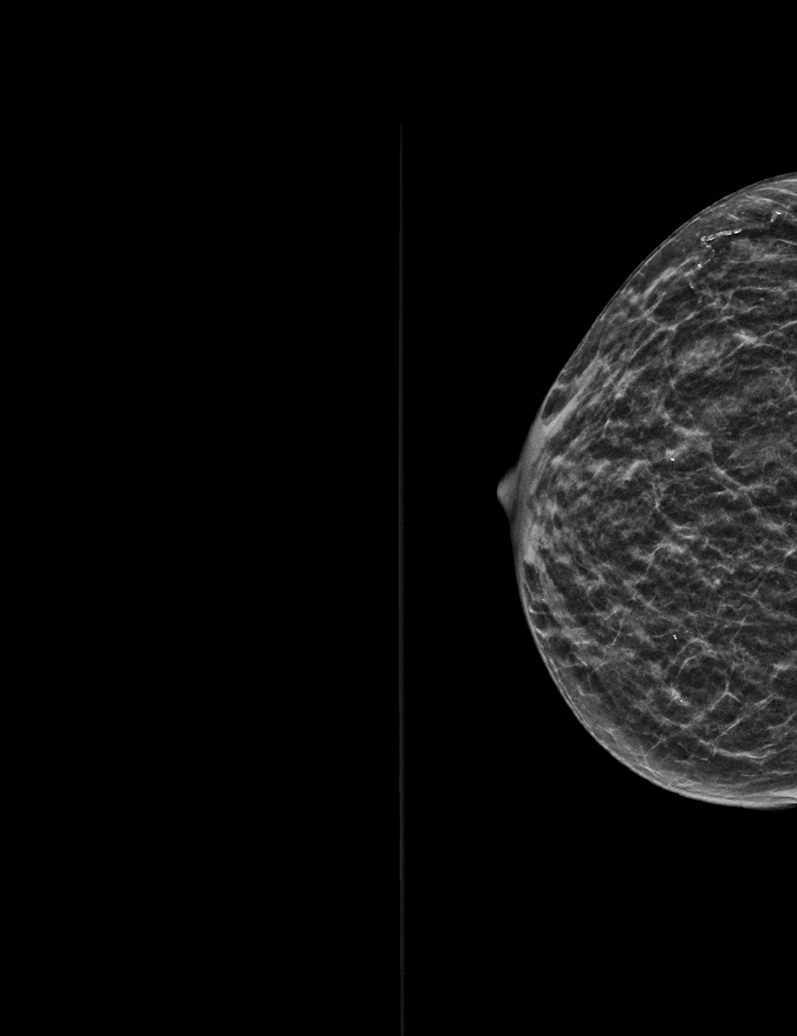

[L MLO synth-2D (2 of 2)]
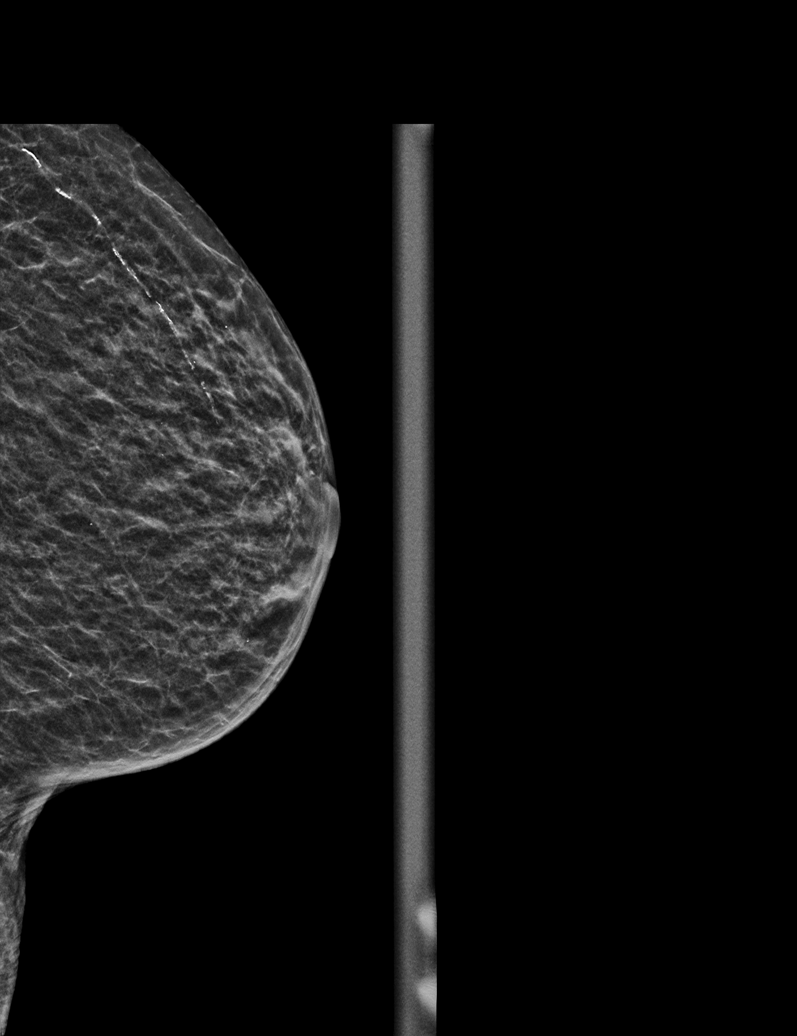

[6 of 36 positions shown; findings below may reference images not displayed]

ACR Breast Density Category b: There are scattered areas of
fibroglandular density.
FINDINGS: There are no findings suspicious for malignancy. The images were
evaluated with computer-aided detection.
IMPRESSION: No mammographic evidence of malignancy. A result letter of this
screening mammogram will be mailed directly to the patient.

RECOMMENDATION:
Screening mammogram in one year. (Code:ZP-7-VX7)

BI-RADS CATEGORY  1: Negative.
